# Patient Record
Sex: Male | Born: 1960 | Race: White | Hispanic: No | Marital: Married | Smoking: Current every day smoker
Health system: Southern US, Community
[De-identification: ages and names within clinical notes are randomized; demographics above are authoritative.]

## PROBLEM LIST (undated history)

## (undated) DIAGNOSIS — I10 Essential (primary) hypertension: Secondary | ICD-10-CM

## (undated) DIAGNOSIS — K219 Gastro-esophageal reflux disease without esophagitis: Secondary | ICD-10-CM

## (undated) DIAGNOSIS — K449 Diaphragmatic hernia without obstruction or gangrene: Secondary | ICD-10-CM

## (undated) DIAGNOSIS — M1990 Unspecified osteoarthritis, unspecified site: Secondary | ICD-10-CM

## (undated) DIAGNOSIS — Z87442 Personal history of urinary calculi: Secondary | ICD-10-CM

## (undated) HISTORY — PX: BACK SURGERY: SHX140

## (undated) HISTORY — PX: JOINT REPLACEMENT: SHX530

## (undated) HISTORY — PX: SHOULDER ARTHROSCOPY: SHX128

## (undated) HISTORY — PX: ANTERIOR CERVICAL DECOMP/DISCECTOMY FUSION: SHX1161

---

## 2003-12-12 ENCOUNTER — Ambulatory Visit: Payer: Self-pay | Admitting: Anesthesiology

## 2003-12-31 ENCOUNTER — Ambulatory Visit: Payer: Self-pay | Admitting: Anesthesiology

## 2004-01-06 ENCOUNTER — Encounter: Payer: Self-pay | Admitting: Anesthesiology

## 2004-01-07 ENCOUNTER — Encounter: Payer: Self-pay | Admitting: Anesthesiology

## 2004-01-29 ENCOUNTER — Ambulatory Visit: Payer: Self-pay | Admitting: Anesthesiology

## 2004-02-06 ENCOUNTER — Encounter: Payer: Self-pay | Admitting: Anesthesiology

## 2004-02-24 ENCOUNTER — Ambulatory Visit: Payer: Self-pay | Admitting: Anesthesiology

## 2004-03-26 ENCOUNTER — Ambulatory Visit: Payer: Self-pay | Admitting: Anesthesiology

## 2004-04-22 ENCOUNTER — Ambulatory Visit: Payer: Self-pay | Admitting: Anesthesiology

## 2004-05-21 ENCOUNTER — Ambulatory Visit: Payer: Self-pay | Admitting: Anesthesiology

## 2004-06-10 ENCOUNTER — Ambulatory Visit: Payer: Self-pay | Admitting: Anesthesiology

## 2004-07-06 ENCOUNTER — Ambulatory Visit: Payer: Self-pay | Admitting: Anesthesiology

## 2004-08-06 ENCOUNTER — Ambulatory Visit: Payer: Self-pay | Admitting: Anesthesiology

## 2004-08-06 ENCOUNTER — Observation Stay: Payer: Self-pay | Admitting: Anesthesiology

## 2004-10-29 ENCOUNTER — Ambulatory Visit: Payer: Self-pay | Admitting: Anesthesiology

## 2004-11-26 ENCOUNTER — Ambulatory Visit: Payer: Self-pay | Admitting: Anesthesiology

## 2004-12-29 ENCOUNTER — Ambulatory Visit: Payer: Self-pay | Admitting: Anesthesiology

## 2005-01-19 ENCOUNTER — Ambulatory Visit: Payer: Self-pay | Admitting: Anesthesiology

## 2005-02-25 ENCOUNTER — Ambulatory Visit: Payer: Self-pay | Admitting: Anesthesiology

## 2005-03-30 ENCOUNTER — Ambulatory Visit: Payer: Self-pay | Admitting: Anesthesiology

## 2005-04-22 ENCOUNTER — Ambulatory Visit: Payer: Self-pay | Admitting: Anesthesiology

## 2005-05-20 ENCOUNTER — Ambulatory Visit: Payer: Self-pay | Admitting: Anesthesiology

## 2005-06-09 ENCOUNTER — Ambulatory Visit: Payer: Self-pay | Admitting: Anesthesiology

## 2005-07-22 ENCOUNTER — Ambulatory Visit: Payer: Self-pay | Admitting: Anesthesiology

## 2005-08-12 ENCOUNTER — Ambulatory Visit: Payer: Self-pay | Admitting: Anesthesiology

## 2005-09-16 ENCOUNTER — Ambulatory Visit: Payer: Self-pay | Admitting: Anesthesiology

## 2005-11-15 ENCOUNTER — Ambulatory Visit: Payer: Self-pay | Admitting: Anesthesiology

## 2005-12-13 ENCOUNTER — Ambulatory Visit: Payer: Self-pay | Admitting: Anesthesiology

## 2006-01-11 ENCOUNTER — Ambulatory Visit: Payer: Self-pay | Admitting: Anesthesiology

## 2006-02-09 ENCOUNTER — Ambulatory Visit: Payer: Self-pay | Admitting: Anesthesiology

## 2006-03-31 ENCOUNTER — Ambulatory Visit: Payer: Self-pay | Admitting: Anesthesiology

## 2006-04-28 ENCOUNTER — Ambulatory Visit: Payer: Self-pay | Admitting: Anesthesiology

## 2006-06-09 ENCOUNTER — Ambulatory Visit: Payer: Self-pay | Admitting: Anesthesiology

## 2006-07-06 ENCOUNTER — Ambulatory Visit: Payer: Self-pay | Admitting: Anesthesiology

## 2006-08-04 ENCOUNTER — Ambulatory Visit: Payer: Self-pay | Admitting: Anesthesiology

## 2006-09-06 ENCOUNTER — Ambulatory Visit: Payer: Self-pay | Admitting: Anesthesiology

## 2006-09-26 ENCOUNTER — Ambulatory Visit: Payer: Self-pay | Admitting: Pain Medicine

## 2006-09-29 ENCOUNTER — Ambulatory Visit: Payer: Self-pay | Admitting: Anesthesiology

## 2006-10-31 ENCOUNTER — Ambulatory Visit: Payer: Self-pay | Admitting: Anesthesiology

## 2006-12-06 ENCOUNTER — Ambulatory Visit: Payer: Self-pay | Admitting: Anesthesiology

## 2007-01-05 ENCOUNTER — Ambulatory Visit: Payer: Self-pay | Admitting: Anesthesiology

## 2007-01-30 ENCOUNTER — Ambulatory Visit: Payer: Self-pay | Admitting: Anesthesiology

## 2007-02-27 ENCOUNTER — Ambulatory Visit: Payer: Self-pay | Admitting: Anesthesiology

## 2007-05-01 ENCOUNTER — Ambulatory Visit: Payer: Self-pay | Admitting: Anesthesiology

## 2007-06-27 ENCOUNTER — Ambulatory Visit: Payer: Self-pay | Admitting: Anesthesiology

## 2007-08-01 ENCOUNTER — Ambulatory Visit: Payer: Self-pay | Admitting: Anesthesiology

## 2007-10-03 ENCOUNTER — Ambulatory Visit: Payer: Self-pay | Admitting: Anesthesiology

## 2007-11-01 ENCOUNTER — Ambulatory Visit: Payer: Self-pay | Admitting: Anesthesiology

## 2007-11-15 ENCOUNTER — Ambulatory Visit: Payer: Self-pay | Admitting: Unknown Physician Specialty

## 2007-11-27 ENCOUNTER — Ambulatory Visit: Payer: Self-pay | Admitting: Anesthesiology

## 2007-12-14 ENCOUNTER — Other Ambulatory Visit: Payer: Self-pay

## 2007-12-14 ENCOUNTER — Ambulatory Visit: Payer: Self-pay | Admitting: Unknown Physician Specialty

## 2007-12-20 ENCOUNTER — Ambulatory Visit: Payer: Self-pay | Admitting: Unknown Physician Specialty

## 2007-12-27 ENCOUNTER — Ambulatory Visit: Payer: Self-pay | Admitting: Anesthesiology

## 2008-01-18 ENCOUNTER — Ambulatory Visit: Payer: Self-pay | Admitting: Anesthesiology

## 2008-02-27 ENCOUNTER — Ambulatory Visit: Payer: Self-pay | Admitting: Anesthesiology

## 2008-04-25 ENCOUNTER — Ambulatory Visit: Payer: Self-pay | Admitting: Anesthesiology

## 2008-06-24 ENCOUNTER — Ambulatory Visit: Payer: Self-pay | Admitting: Anesthesiology

## 2008-09-19 ENCOUNTER — Ambulatory Visit: Payer: Self-pay | Admitting: Anesthesiology

## 2008-11-19 ENCOUNTER — Ambulatory Visit: Payer: Self-pay | Admitting: Anesthesiology

## 2009-01-04 ENCOUNTER — Ambulatory Visit: Payer: Self-pay | Admitting: Unknown Physician Specialty

## 2009-02-11 ENCOUNTER — Ambulatory Visit: Payer: Self-pay | Admitting: Unknown Physician Specialty

## 2009-02-18 ENCOUNTER — Ambulatory Visit: Payer: Self-pay | Admitting: Unknown Physician Specialty

## 2010-10-23 ENCOUNTER — Ambulatory Visit: Payer: Self-pay | Admitting: Unknown Physician Specialty

## 2010-11-06 ENCOUNTER — Ambulatory Visit: Payer: Self-pay | Admitting: Unknown Physician Specialty

## 2012-04-12 ENCOUNTER — Emergency Department: Payer: Self-pay | Admitting: Emergency Medicine

## 2012-04-12 LAB — BASIC METABOLIC PANEL
Anion Gap: 7 (ref 7–16)
BUN: 14 mg/dL (ref 7–18)
Calcium, Total: 9.3 mg/dL (ref 8.5–10.1)
Chloride: 104 mmol/L (ref 98–107)
EGFR (African American): >60
EGFR (Non-African Amer.): >60
Glucose: 124 mg/dL — ABNORMAL HIGH (ref 65–99)
Potassium: 3.3 mmol/L — ABNORMAL LOW (ref 3.5–5.1)

## 2012-04-12 LAB — CBC
HGB: 16.6 g/dL (ref 13.0–18.0)
MCH: 30.3 pg (ref 26.0–34.0)
MCHC: 34.6 g/dL (ref 32.0–36.0)
Platelet: 273 10*3/uL (ref 150–440)

## 2012-04-12 LAB — TROPONIN I: Troponin-I: <0.02 ng/mL

## 2012-04-12 LAB — CK TOTAL AND CKMB (NOT AT ARMC): CK-MB: <0.5 ng/mL — ABNORMAL LOW (ref 0.5–3.6)

## 2014-05-22 DIAGNOSIS — G6289 Other specified polyneuropathies: Secondary | ICD-10-CM | POA: Diagnosis not present

## 2014-05-22 DIAGNOSIS — I10 Essential (primary) hypertension: Secondary | ICD-10-CM | POA: Diagnosis not present

## 2014-05-22 DIAGNOSIS — M545 Low back pain: Secondary | ICD-10-CM | POA: Diagnosis not present

## 2014-05-22 DIAGNOSIS — F1190 Opioid use, unspecified, uncomplicated: Secondary | ICD-10-CM | POA: Diagnosis not present

## 2014-06-30 ENCOUNTER — Emergency Department: Admit: 2014-06-30 | Disposition: A | Discharge status: Home / Self Care | Payer: Self-pay | Admitting: Student

## 2014-06-30 DIAGNOSIS — Z880 Allergy status to penicillin: Secondary | ICD-10-CM | POA: Diagnosis not present

## 2014-06-30 DIAGNOSIS — R569 Unspecified convulsions: Secondary | ICD-10-CM | POA: Diagnosis not present

## 2014-06-30 DIAGNOSIS — I10 Essential (primary) hypertension: Secondary | ICD-10-CM | POA: Diagnosis not present

## 2014-06-30 DIAGNOSIS — G40909 Epilepsy, unspecified, not intractable, without status epilepticus: Secondary | ICD-10-CM | POA: Diagnosis not present

## 2014-06-30 DIAGNOSIS — Z720 Tobacco use: Secondary | ICD-10-CM | POA: Diagnosis not present

## 2014-06-30 LAB — CBC
HCT: 44.3 % (ref 40.0–52.0)
HGB: 15.3 g/dL (ref 13.0–18.0)
MCH: 30.4 pg (ref 26.0–34.0)
MCHC: 34.4 g/dL (ref 32.0–36.0)
MCV: 88 fL (ref 80–100)
PLATELETS: 226 10*3/uL (ref 150–440)
RBC: 5.02 10*6/uL (ref 4.40–5.90)
RDW: 13.6 % (ref 11.5–14.5)
WBC: 14 10*3/uL — ABNORMAL HIGH (ref 3.8–10.6)

## 2014-06-30 LAB — URINALYSIS, COMPLETE
BACTERIA: NONE SEEN
BILIRUBIN, UR: NEGATIVE
GLUCOSE, UR: NEGATIVE mg/dL (ref 0–75)
Ketone: NEGATIVE
Leukocyte Esterase: NEGATIVE
Nitrite: NEGATIVE
Ph: 5 (ref 4.5–8.0)
Protein: 30
SPECIFIC GRAVITY: 1.015 (ref 1.003–1.030)
Squamous Epithelial: NONE SEEN

## 2014-06-30 LAB — BASIC METABOLIC PANEL
ANION GAP: 14 (ref 7–16)
BUN: 10 mg/dL
CHLORIDE: 105 mmol/L
CREATININE: 0.85 mg/dL
Calcium, Total: 8.9 mg/dL
Co2: 20 mmol/L — ABNORMAL LOW
EGFR (African American): >60
Glucose: 115 mg/dL — ABNORMAL HIGH
POTASSIUM: 3.3 mmol/L — AB
SODIUM: 139 mmol/L

## 2014-06-30 LAB — DRUG SCREEN, URINE
Amphetamines, Ur Screen: NEGATIVE
BARBITURATES, UR SCREEN: NEGATIVE
Benzodiazepine, Ur Scrn: POSITIVE
CANNABINOID 50 NG, UR ~~LOC~~: POSITIVE
COCAINE METABOLITE, UR ~~LOC~~: NEGATIVE
MDMA (ECSTASY) UR SCREEN: NEGATIVE
Methadone, Ur Screen: NEGATIVE
OPIATE, UR SCREEN: POSITIVE
PHENCYCLIDINE (PCP) UR S: NEGATIVE
Tricyclic, Ur Screen: POSITIVE

## 2014-06-30 LAB — TROPONIN I: Troponin-I: <0.03 ng/mL

## 2014-06-30 LAB — ETHANOL

## 2015-12-10 DIAGNOSIS — Z720 Tobacco use: Secondary | ICD-10-CM | POA: Diagnosis present

## 2017-07-26 DIAGNOSIS — I10 Essential (primary) hypertension: Secondary | ICD-10-CM | POA: Diagnosis not present

## 2017-07-26 DIAGNOSIS — E7800 Pure hypercholesterolemia, unspecified: Secondary | ICD-10-CM | POA: Diagnosis not present

## 2017-07-26 DIAGNOSIS — Z720 Tobacco use: Secondary | ICD-10-CM | POA: Diagnosis not present

## 2017-07-26 DIAGNOSIS — Z125 Encounter for screening for malignant neoplasm of prostate: Secondary | ICD-10-CM | POA: Diagnosis not present

## 2017-10-26 DIAGNOSIS — G6289 Other specified polyneuropathies: Secondary | ICD-10-CM | POA: Diagnosis not present

## 2017-10-26 DIAGNOSIS — M545 Low back pain: Secondary | ICD-10-CM | POA: Diagnosis not present

## 2017-10-26 DIAGNOSIS — Z0000 Encounter for general adult medical examination without abnormal findings: Secondary | ICD-10-CM | POA: Diagnosis not present

## 2017-10-26 DIAGNOSIS — I10 Essential (primary) hypertension: Secondary | ICD-10-CM | POA: Diagnosis not present

## 2017-10-26 DIAGNOSIS — E7800 Pure hypercholesterolemia, unspecified: Secondary | ICD-10-CM | POA: Diagnosis not present

## 2018-01-04 DIAGNOSIS — I10 Essential (primary) hypertension: Secondary | ICD-10-CM | POA: Diagnosis not present

## 2018-01-04 DIAGNOSIS — E7800 Pure hypercholesterolemia, unspecified: Secondary | ICD-10-CM | POA: Diagnosis not present

## 2018-01-11 DIAGNOSIS — M545 Low back pain: Secondary | ICD-10-CM | POA: Diagnosis not present

## 2018-01-11 DIAGNOSIS — E7800 Pure hypercholesterolemia, unspecified: Secondary | ICD-10-CM | POA: Diagnosis not present

## 2018-01-11 DIAGNOSIS — G6289 Other specified polyneuropathies: Secondary | ICD-10-CM | POA: Diagnosis not present

## 2018-01-11 DIAGNOSIS — Z0001 Encounter for general adult medical examination with abnormal findings: Secondary | ICD-10-CM | POA: Diagnosis not present

## 2018-01-11 DIAGNOSIS — I10 Essential (primary) hypertension: Secondary | ICD-10-CM | POA: Diagnosis not present

## 2018-04-13 ENCOUNTER — Other Ambulatory Visit: Payer: Self-pay

## 2018-04-13 ENCOUNTER — Emergency Department: Payer: Medicare Other

## 2018-04-13 ENCOUNTER — Emergency Department
Admission: EM | Admit: 2018-04-13 | Discharge: 2018-04-13 | Discharge status: Against Medical Advice | Payer: Medicare Other | Attending: Emergency Medicine | Admitting: Emergency Medicine

## 2018-04-13 ENCOUNTER — Encounter: Payer: Self-pay | Admitting: Emergency Medicine

## 2018-04-13 DIAGNOSIS — R410 Disorientation, unspecified: Secondary | ICD-10-CM

## 2018-04-13 DIAGNOSIS — Z5321 Procedure and treatment not carried out due to patient leaving prior to being seen by health care provider: Secondary | ICD-10-CM | POA: Insufficient documentation

## 2018-04-13 DIAGNOSIS — R4182 Altered mental status, unspecified: Secondary | ICD-10-CM | POA: Diagnosis not present

## 2018-04-13 HISTORY — DX: Unspecified osteoarthritis, unspecified site: M19.90

## 2018-04-13 HISTORY — DX: Diaphragmatic hernia without obstruction or gangrene: K44.9

## 2018-04-13 HISTORY — DX: Essential (primary) hypertension: I10

## 2018-04-13 LAB — COMPREHENSIVE METABOLIC PANEL
ALBUMIN: 4 g/dL (ref 3.5–5.0)
ALT: 21 U/L (ref 0–44)
ANION GAP: 6 (ref 5–15)
AST: 21 U/L (ref 15–41)
Alkaline Phosphatase: 49 U/L (ref 38–126)
BUN: 8 mg/dL (ref 6–20)
CO2: 30 mmol/L (ref 22–32)
Calcium: 8.3 mg/dL — ABNORMAL LOW (ref 8.9–10.3)
Chloride: 105 mmol/L (ref 98–111)
Creatinine, Ser: 0.86 mg/dL (ref 0.61–1.24)
GFR calc Af Amer: >60 mL/min (ref >60)
GFR calc non Af Amer: >60 mL/min (ref >60)
GLUCOSE: 134 mg/dL — AB (ref 70–99)
POTASSIUM: 3.7 mmol/L (ref 3.5–5.1)
Sodium: 141 mmol/L (ref 135–145)
Total Bilirubin: 0.5 mg/dL (ref 0.3–1.2)
Total Protein: 6.4 g/dL — ABNORMAL LOW (ref 6.5–8.1)

## 2018-04-13 LAB — CBC
HCT: 42.5 % (ref 39.0–52.0)
HEMOGLOBIN: 14.1 g/dL (ref 13.0–17.0)
MCH: 29.6 pg (ref 26.0–34.0)
MCHC: 33.2 g/dL (ref 30.0–36.0)
MCV: 89.3 fL (ref 80.0–100.0)
Platelets: 217 10*3/uL (ref 150–400)
RBC: 4.76 MIL/uL (ref 4.22–5.81)
RDW: 13 % (ref 11.5–15.5)
WBC: 10.1 10*3/uL (ref 4.0–10.5)
nRBC: 0 % (ref 0.0–0.2)

## 2018-04-13 LAB — TROPONIN I: Troponin I: <0.03 ng/mL (ref <0.03)

## 2018-04-13 LAB — TSH: TSH: 2.492 u[IU]/mL (ref 0.350–4.500)

## 2018-04-13 NOTE — ED Notes (Signed)
Patient left without being seen, Md made aware.

## 2018-04-13 NOTE — ED Provider Notes (Signed)
The patient left prior to my medical examination.   Rockne Menghini, MD 04/13/18 2220

## 2018-04-13 NOTE — ED Notes (Signed)
Assumed care of patient reports having memory loss. No facial drooping noted. Patient sitting up in bed having conversation with brother. Speech is cleer.

## 2018-04-13 NOTE — ED Notes (Signed)
Patient awaiting plan of care. Sitting up in bed talking to his brother. Appears comfortable at present time. Will continue to monitor.

## 2018-04-13 NOTE — ED Triage Notes (Signed)
Pt has been confused since he fell and lost consciousness in October. Today the son states his left jaw was drooping and more confused. Pt was able to give all info. Pt has seen pcp and has another appt on Monday. Son unsure what his dr is diagnosing him with.

## 2018-04-17 DIAGNOSIS — Z720 Tobacco use: Secondary | ICD-10-CM | POA: Diagnosis not present

## 2018-04-17 DIAGNOSIS — Z0000 Encounter for general adult medical examination without abnormal findings: Secondary | ICD-10-CM | POA: Diagnosis not present

## 2018-04-17 DIAGNOSIS — E7800 Pure hypercholesterolemia, unspecified: Secondary | ICD-10-CM | POA: Diagnosis not present

## 2018-04-17 DIAGNOSIS — I10 Essential (primary) hypertension: Secondary | ICD-10-CM | POA: Diagnosis not present

## 2018-07-17 DIAGNOSIS — I10 Essential (primary) hypertension: Secondary | ICD-10-CM | POA: Diagnosis not present

## 2018-07-17 DIAGNOSIS — Z720 Tobacco use: Secondary | ICD-10-CM | POA: Diagnosis not present

## 2018-07-17 DIAGNOSIS — G6289 Other specified polyneuropathies: Secondary | ICD-10-CM | POA: Diagnosis not present

## 2018-07-17 DIAGNOSIS — E7800 Pure hypercholesterolemia, unspecified: Secondary | ICD-10-CM | POA: Diagnosis not present

## 2018-07-17 DIAGNOSIS — M545 Low back pain: Secondary | ICD-10-CM | POA: Diagnosis not present

## 2018-10-17 DIAGNOSIS — I10 Essential (primary) hypertension: Secondary | ICD-10-CM | POA: Diagnosis not present

## 2018-10-17 DIAGNOSIS — E7800 Pure hypercholesterolemia, unspecified: Secondary | ICD-10-CM | POA: Diagnosis not present

## 2018-10-17 DIAGNOSIS — M545 Low back pain: Secondary | ICD-10-CM | POA: Diagnosis not present

## 2018-10-17 DIAGNOSIS — Z720 Tobacco use: Secondary | ICD-10-CM | POA: Diagnosis not present

## 2018-10-17 DIAGNOSIS — G6289 Other specified polyneuropathies: Secondary | ICD-10-CM | POA: Diagnosis not present

## 2018-10-31 DIAGNOSIS — R3129 Other microscopic hematuria: Secondary | ICD-10-CM | POA: Diagnosis not present

## 2018-11-15 DIAGNOSIS — R310 Gross hematuria: Secondary | ICD-10-CM | POA: Diagnosis not present

## 2018-11-16 ENCOUNTER — Other Ambulatory Visit: Payer: Self-pay | Admitting: Urology

## 2018-11-16 DIAGNOSIS — R310 Gross hematuria: Secondary | ICD-10-CM

## 2018-11-27 ENCOUNTER — Ambulatory Visit
Admission: RE | Admit: 2018-11-27 | Discharge: 2018-11-27 | Disposition: A | Discharge status: Home / Self Care | Payer: Medicare Other | Source: Ambulatory Visit | Attending: Urology | Admitting: Urology

## 2018-11-27 ENCOUNTER — Other Ambulatory Visit: Payer: Self-pay

## 2018-11-27 DIAGNOSIS — N200 Calculus of kidney: Secondary | ICD-10-CM | POA: Diagnosis not present

## 2018-11-27 DIAGNOSIS — R310 Gross hematuria: Secondary | ICD-10-CM | POA: Insufficient documentation

## 2018-11-27 MED ORDER — IOHEXOL 300 MG/ML  SOLN
125.0000 mL | Freq: Once | INTRAMUSCULAR | Status: AC | PRN
  Administered 2018-11-27: 125 mL via INTRAVENOUS

## 2018-12-19 DIAGNOSIS — N200 Calculus of kidney: Secondary | ICD-10-CM | POA: Diagnosis not present

## 2018-12-19 DIAGNOSIS — R310 Gross hematuria: Secondary | ICD-10-CM | POA: Diagnosis not present

## 2018-12-20 DIAGNOSIS — R310 Gross hematuria: Secondary | ICD-10-CM | POA: Diagnosis not present

## 2018-12-21 DIAGNOSIS — R310 Gross hematuria: Secondary | ICD-10-CM | POA: Diagnosis not present

## 2019-03-08 DIAGNOSIS — G8929 Other chronic pain: Secondary | ICD-10-CM | POA: Diagnosis not present

## 2019-03-08 DIAGNOSIS — M545 Low back pain: Secondary | ICD-10-CM | POA: Diagnosis not present

## 2019-03-08 DIAGNOSIS — E785 Hyperlipidemia, unspecified: Secondary | ICD-10-CM | POA: Diagnosis not present

## 2019-03-08 DIAGNOSIS — Z0001 Encounter for general adult medical examination with abnormal findings: Secondary | ICD-10-CM | POA: Diagnosis not present

## 2019-03-08 DIAGNOSIS — L03112 Cellulitis of left axilla: Secondary | ICD-10-CM | POA: Diagnosis not present

## 2019-07-05 DIAGNOSIS — Z0000 Encounter for general adult medical examination without abnormal findings: Secondary | ICD-10-CM | POA: Diagnosis not present

## 2019-07-05 DIAGNOSIS — F17210 Nicotine dependence, cigarettes, uncomplicated: Secondary | ICD-10-CM | POA: Diagnosis not present

## 2019-07-05 DIAGNOSIS — Z1389 Encounter for screening for other disorder: Secondary | ICD-10-CM | POA: Diagnosis not present

## 2019-07-05 DIAGNOSIS — I10 Essential (primary) hypertension: Secondary | ICD-10-CM | POA: Diagnosis not present

## 2019-07-05 DIAGNOSIS — E7800 Pure hypercholesterolemia, unspecified: Secondary | ICD-10-CM | POA: Diagnosis not present

## 2019-10-09 DIAGNOSIS — G8929 Other chronic pain: Secondary | ICD-10-CM | POA: Diagnosis not present

## 2019-10-09 DIAGNOSIS — M545 Low back pain: Secondary | ICD-10-CM | POA: Diagnosis not present

## 2019-10-09 DIAGNOSIS — I10 Essential (primary) hypertension: Secondary | ICD-10-CM | POA: Diagnosis not present

## 2020-01-02 DIAGNOSIS — I10 Essential (primary) hypertension: Secondary | ICD-10-CM | POA: Diagnosis not present

## 2020-01-15 DIAGNOSIS — Z23 Encounter for immunization: Secondary | ICD-10-CM | POA: Diagnosis not present

## 2020-01-15 DIAGNOSIS — E7800 Pure hypercholesterolemia, unspecified: Secondary | ICD-10-CM | POA: Diagnosis not present

## 2020-01-15 DIAGNOSIS — R739 Hyperglycemia, unspecified: Secondary | ICD-10-CM | POA: Diagnosis not present

## 2020-01-15 DIAGNOSIS — F17210 Nicotine dependence, cigarettes, uncomplicated: Secondary | ICD-10-CM | POA: Diagnosis not present

## 2020-01-15 DIAGNOSIS — I10 Essential (primary) hypertension: Secondary | ICD-10-CM | POA: Diagnosis not present

## 2020-01-15 DIAGNOSIS — M545 Low back pain, unspecified: Secondary | ICD-10-CM | POA: Diagnosis not present

## 2020-05-06 DIAGNOSIS — E7800 Pure hypercholesterolemia, unspecified: Secondary | ICD-10-CM | POA: Diagnosis not present

## 2020-05-06 DIAGNOSIS — R739 Hyperglycemia, unspecified: Secondary | ICD-10-CM | POA: Diagnosis not present

## 2020-06-13 DIAGNOSIS — Z1389 Encounter for screening for other disorder: Secondary | ICD-10-CM | POA: Diagnosis not present

## 2020-06-13 DIAGNOSIS — Z0001 Encounter for general adult medical examination with abnormal findings: Secondary | ICD-10-CM | POA: Diagnosis not present

## 2020-06-13 DIAGNOSIS — Z720 Tobacco use: Secondary | ICD-10-CM | POA: Diagnosis not present

## 2020-06-13 DIAGNOSIS — E7800 Pure hypercholesterolemia, unspecified: Secondary | ICD-10-CM | POA: Diagnosis not present

## 2020-06-13 DIAGNOSIS — Z0000 Encounter for general adult medical examination without abnormal findings: Secondary | ICD-10-CM | POA: Diagnosis not present

## 2020-06-13 DIAGNOSIS — G8929 Other chronic pain: Secondary | ICD-10-CM | POA: Diagnosis not present

## 2020-06-13 DIAGNOSIS — I10 Essential (primary) hypertension: Secondary | ICD-10-CM | POA: Diagnosis not present

## 2020-06-13 DIAGNOSIS — F17210 Nicotine dependence, cigarettes, uncomplicated: Secondary | ICD-10-CM | POA: Diagnosis not present

## 2020-06-13 DIAGNOSIS — M545 Low back pain, unspecified: Secondary | ICD-10-CM | POA: Diagnosis not present

## 2020-06-13 DIAGNOSIS — Z1211 Encounter for screening for malignant neoplasm of colon: Secondary | ICD-10-CM | POA: Diagnosis not present

## 2020-06-13 DIAGNOSIS — R319 Hematuria, unspecified: Secondary | ICD-10-CM | POA: Diagnosis not present

## 2020-07-21 IMAGING — CT CT HEAD W/O CM
3 series · 15 of 47 positions shown, 18 images · non-contrast
Comparison: 06/30/2014

CLINICAL DATA: Confusion.

EXAM:
CT HEAD WITHOUT CONTRAST
TECHNIQUE: Contiguous axial images were obtained from the base of the skull
through the vertex without intravenous contrast.

[Series 2: head wo · axial · 0.47mm/px · z∈[-155,-30]mm · 9 of 30 slices shown, 12 images]
[im 3/30  brain]
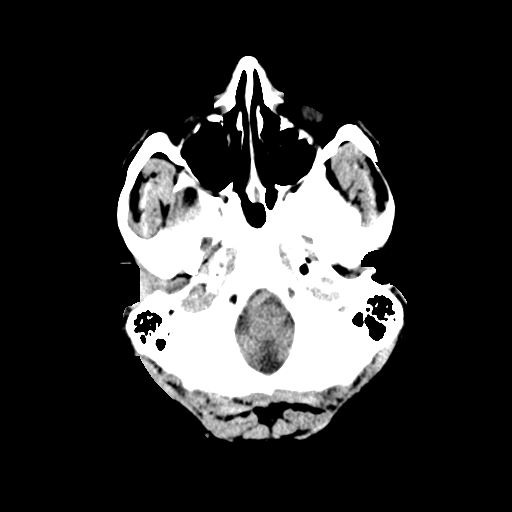
[im 3/30  bone]
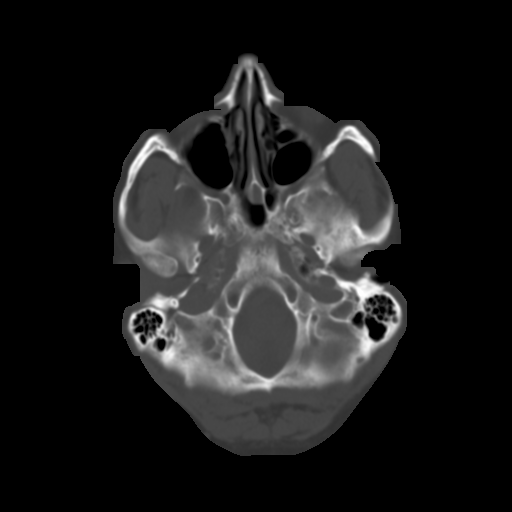
[im 6/30  brain]
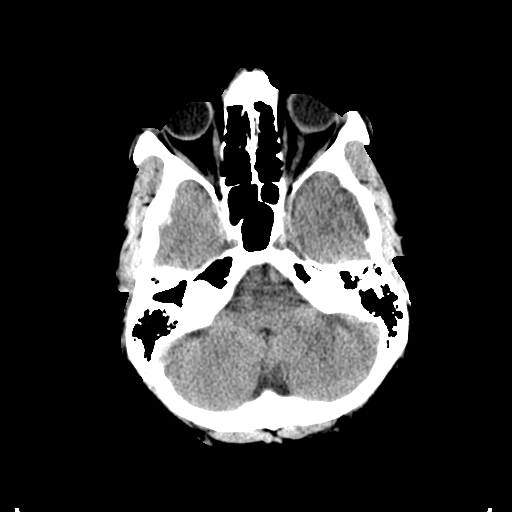
[im 9/30  brain]
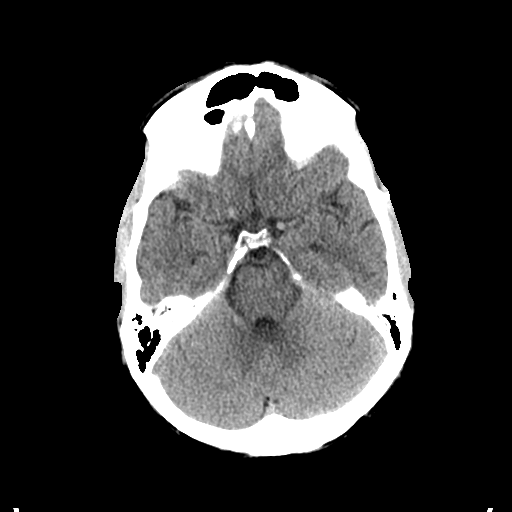
[im 12/30  brain]
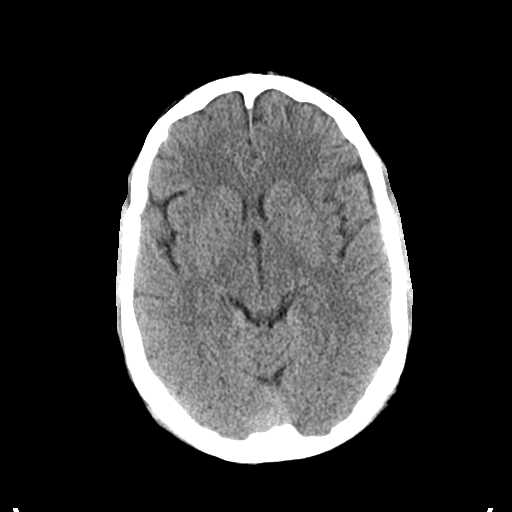
[im 16/30  brain]
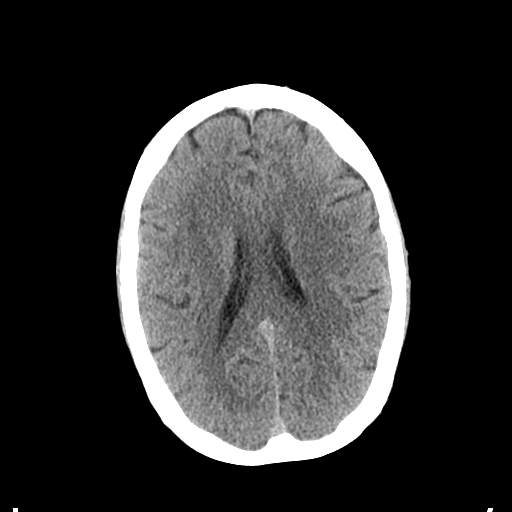
[im 16/30  bone]
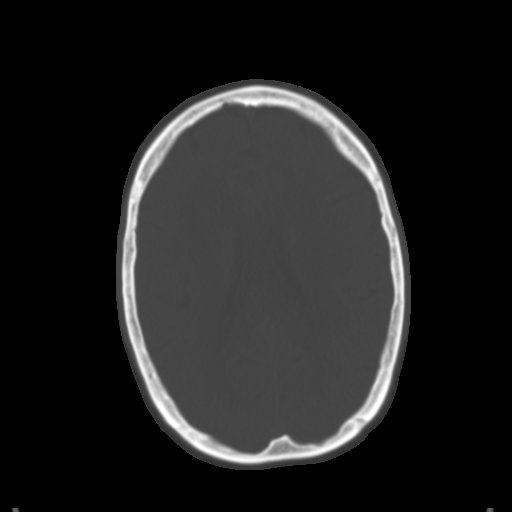
[im 19/30  brain]
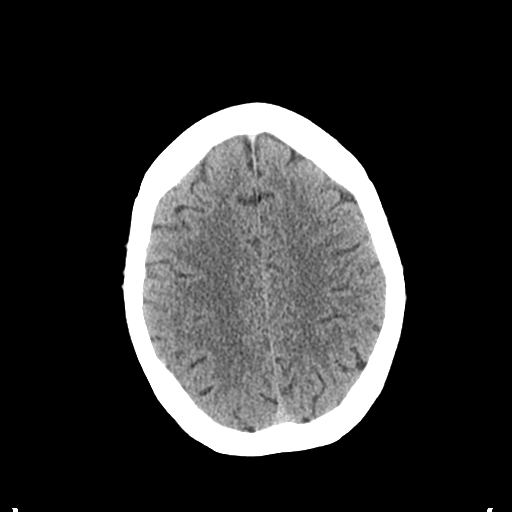
[im 22/30  brain]
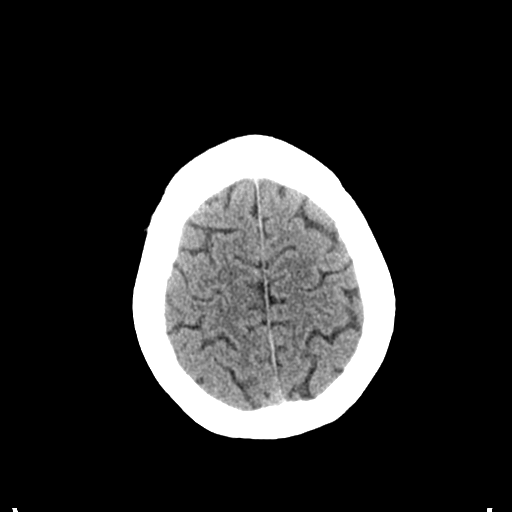
[im 25/30  brain]
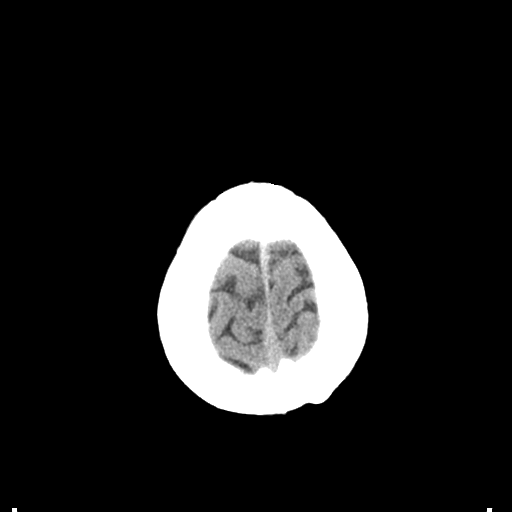
[im 28/30  brain]
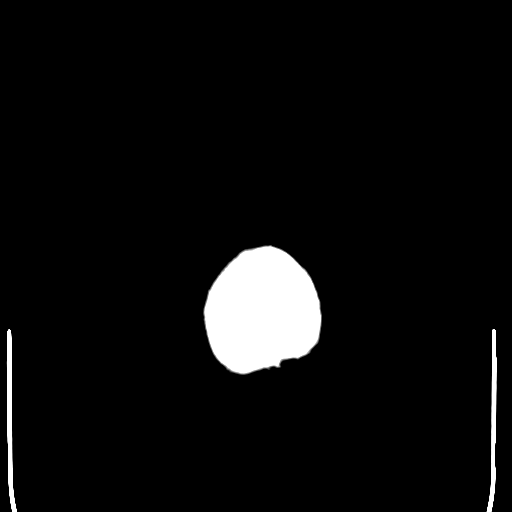
[im 28/30  bone]
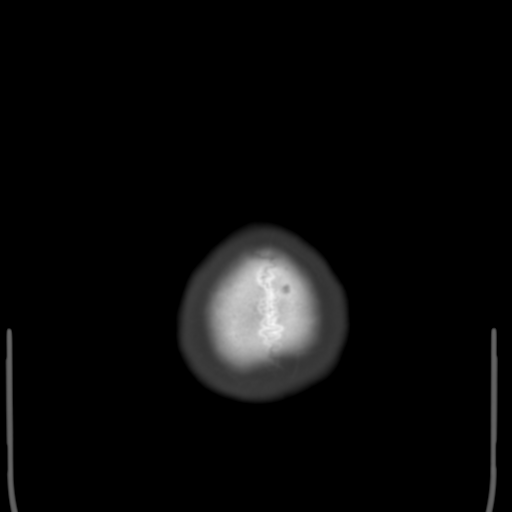

[Series 4: coronal soft tissue · coronal · 0.30mm/px · 3 of 62 slices shown]
[im 21/62  brain]
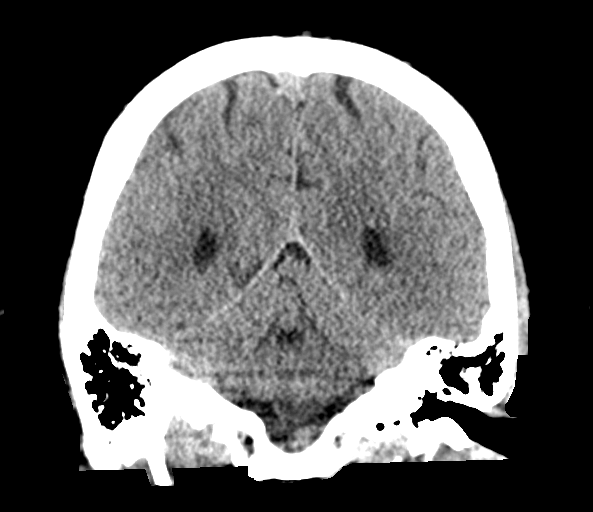
[im 28/62  brain]
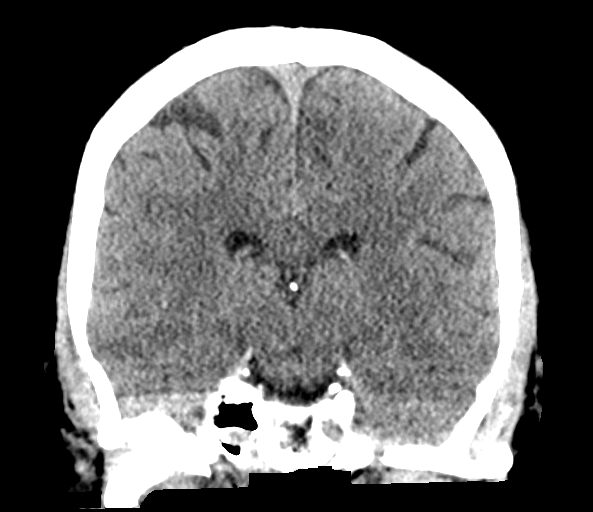
[im 34/62  brain]
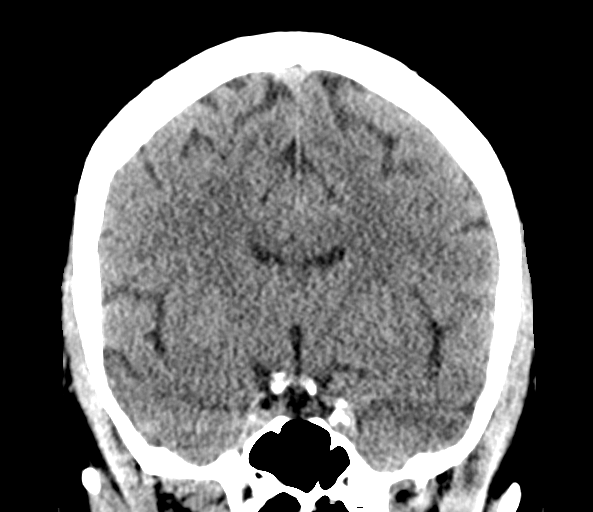

[Series 5: sagittal soft tissue · sagittal · 0.30mm/px · 3 of 52 slices shown]
[im 18/52  brain]
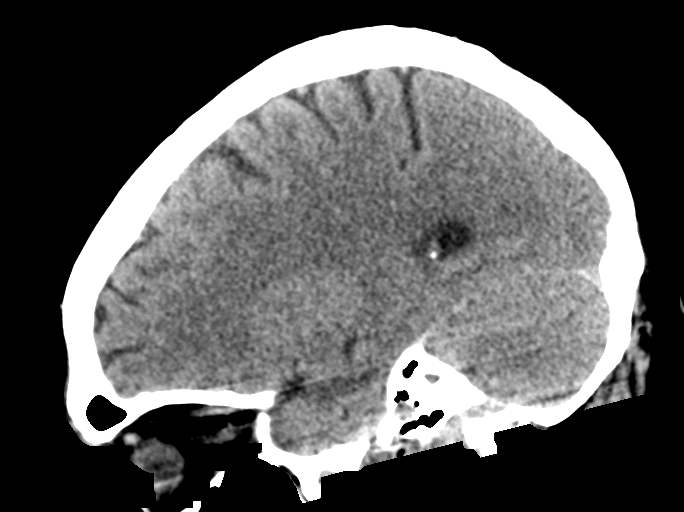
[im 26/52  brain]
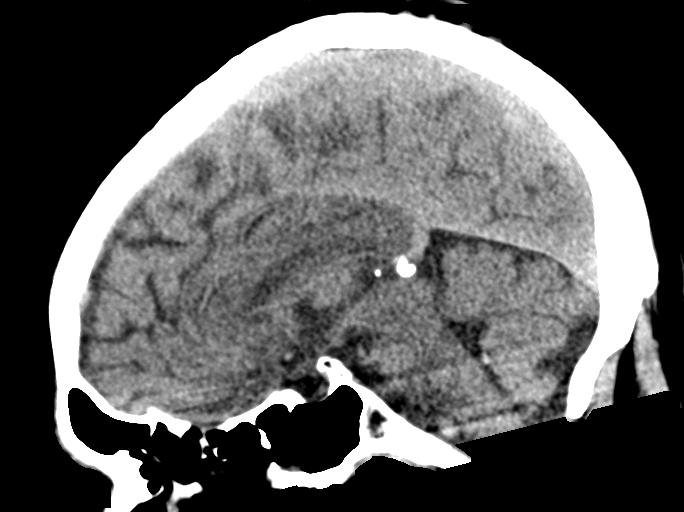
[im 35/52  brain]
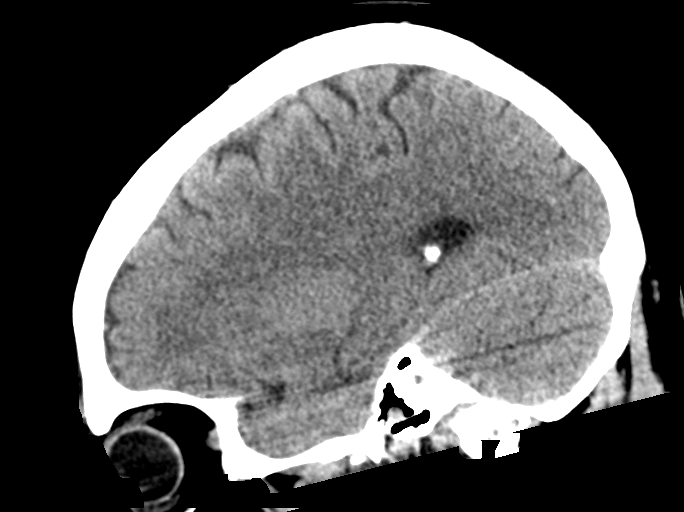

[15 of 47 positions shown; findings below may reference images not displayed]

FINDINGS: Brain: There is no evidence for acute hemorrhage, hydrocephalus,
mass lesion, or abnormal extra-axial fluid collection. No definite
CT evidence for acute infarction.

Vascular: No hyperdense vessel or unexpected calcification.

Skull: No evidence for fracture. No worrisome lytic or sclerotic
lesion.

Sinuses/Orbits: The visualized paranasal sinuses and mastoid air
cells are clear. Visualized portions of the globes and intraorbital
fat are unremarkable.

Other: None.
IMPRESSION: Stable.  No acute intracranial abnormality.

## 2020-09-12 DIAGNOSIS — I10 Essential (primary) hypertension: Secondary | ICD-10-CM | POA: Diagnosis not present

## 2020-09-12 DIAGNOSIS — F17210 Nicotine dependence, cigarettes, uncomplicated: Secondary | ICD-10-CM | POA: Diagnosis not present

## 2020-09-12 DIAGNOSIS — R739 Hyperglycemia, unspecified: Secondary | ICD-10-CM | POA: Diagnosis not present

## 2020-09-12 DIAGNOSIS — Z720 Tobacco use: Secondary | ICD-10-CM | POA: Diagnosis not present

## 2020-09-12 DIAGNOSIS — G8929 Other chronic pain: Secondary | ICD-10-CM | POA: Diagnosis not present

## 2020-09-12 DIAGNOSIS — Z1389 Encounter for screening for other disorder: Secondary | ICD-10-CM | POA: Diagnosis not present

## 2020-09-12 DIAGNOSIS — M545 Low back pain, unspecified: Secondary | ICD-10-CM | POA: Diagnosis not present

## 2020-09-12 DIAGNOSIS — E7800 Pure hypercholesterolemia, unspecified: Secondary | ICD-10-CM | POA: Diagnosis not present

## 2020-09-12 DIAGNOSIS — Z0000 Encounter for general adult medical examination without abnormal findings: Secondary | ICD-10-CM | POA: Diagnosis not present

## 2021-03-06 IMAGING — CT CT ABD-PEL WO/W CM
3 of 12 series · 12 of 46 positions shown, 18 images · IV contrast (omnipaque)
Comparison: None.

CLINICAL DATA: Gross hematuria, dysuria, history of kidney stones

EXAM:
CT ABDOMEN AND PELVIS WITHOUT AND WITH CONTRAST
TECHNIQUE: Multidetector CT imaging of the abdomen and pelvis was performed
following the standard protocol before and following the bolus
administration of intravenous contrast.
CONTRAST:  125mL OMNIPAQUE IOHEXOL 300 MG/ML  SOLN

[Series 5: cor without without pre · coronal · non-contrast · 0.69mm/px · 2 of 144 slices shown, 3 images]
[im 48/144  soft-tissue]
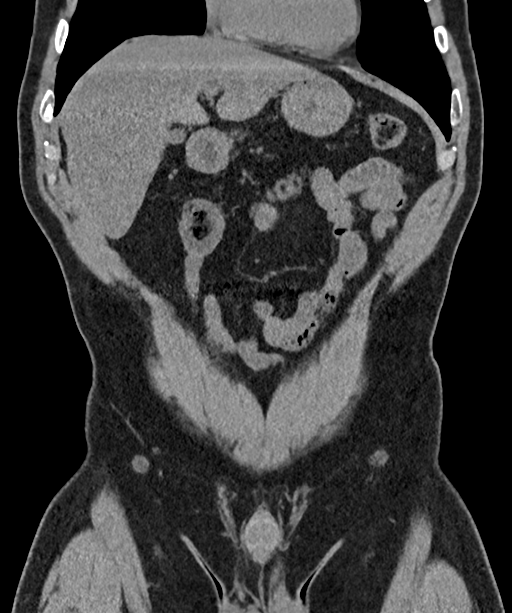
[im 48/144  bone]
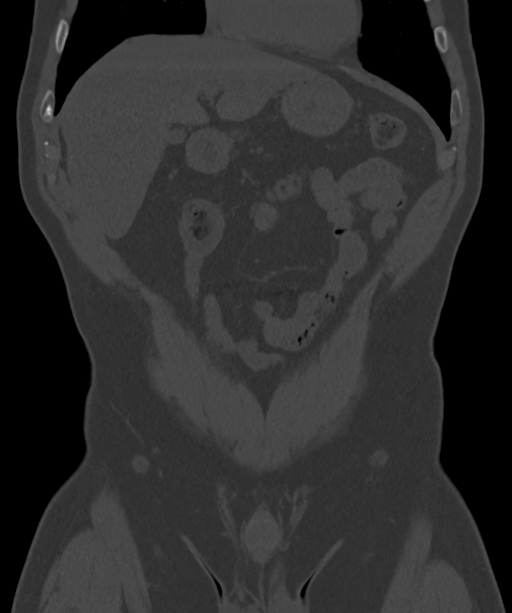
[im 96/144  soft-tissue]
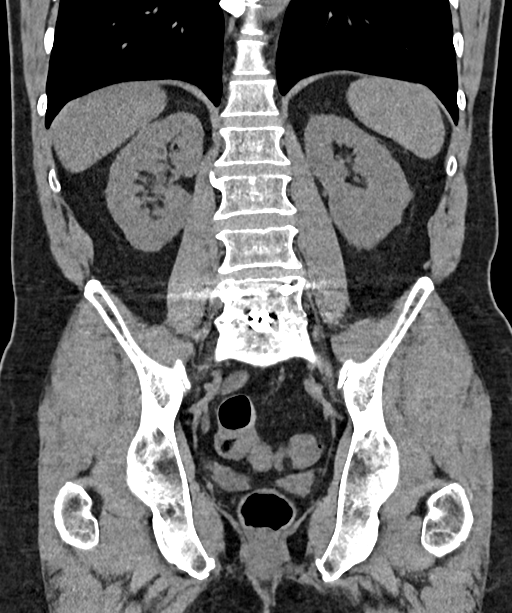

[Series 9: axial with hematuria with · axial · 0.69mm/px · z∈[-1457,-1277]mm · 4 of 85 slices shown]
[im 13/85  soft-tissue]
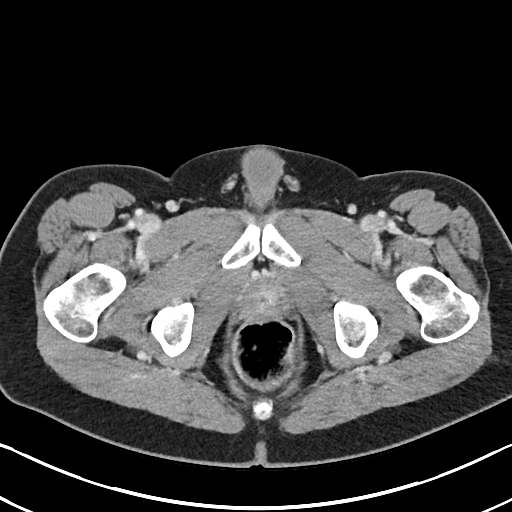
[im 25/85  soft-tissue]
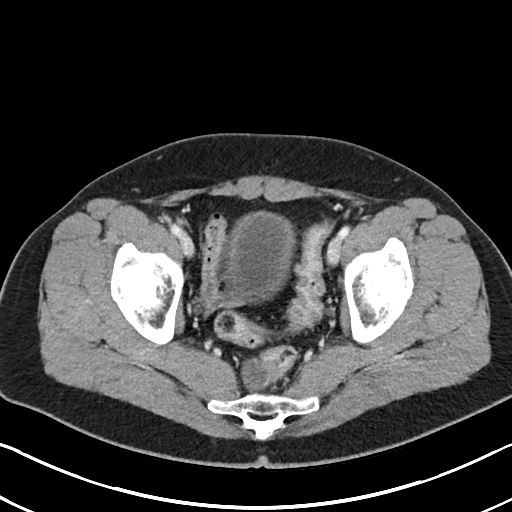
[im 37/85  soft-tissue]
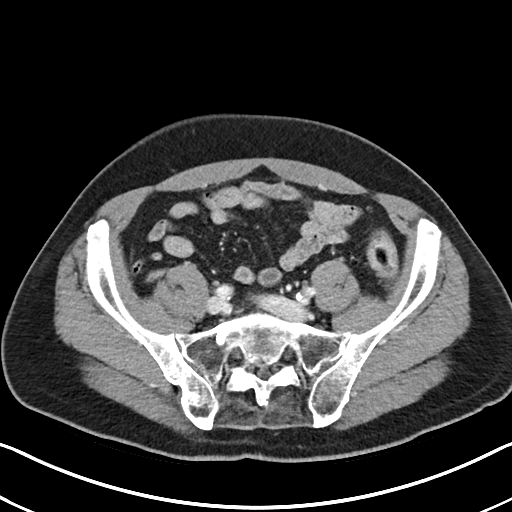
[im 49/85  soft-tissue]
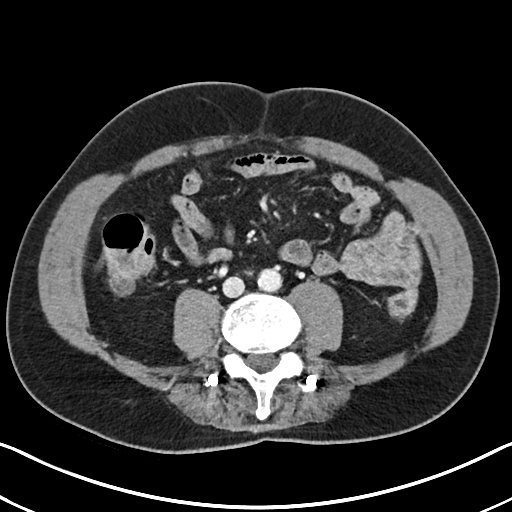

[Series 17: axial delay delay prone · axial · delayed · 0.69mm/px · z∈[-1439,-1119]mm · 6 of 90 slices shown, 11 images]
[im 13/90  soft-tissue]
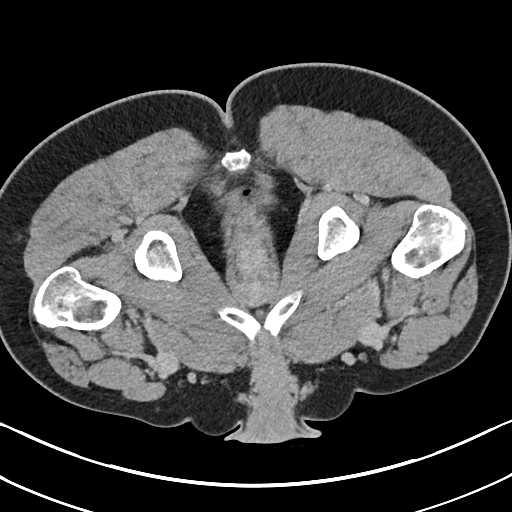
[im 13/90  bone]
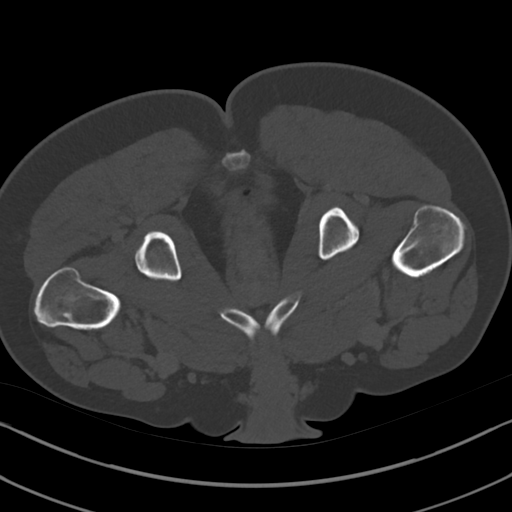
[im 26/90  soft-tissue]
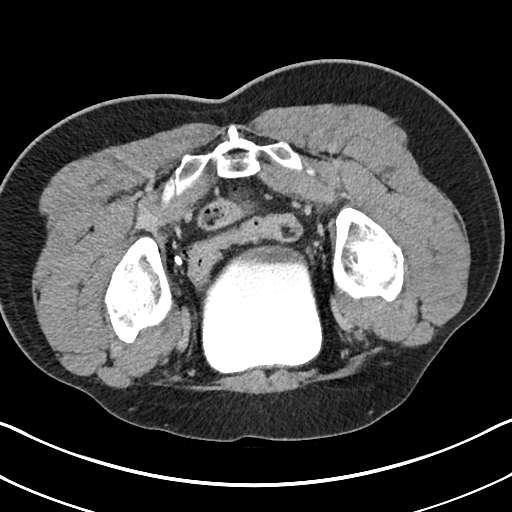
[im 39/90  soft-tissue]
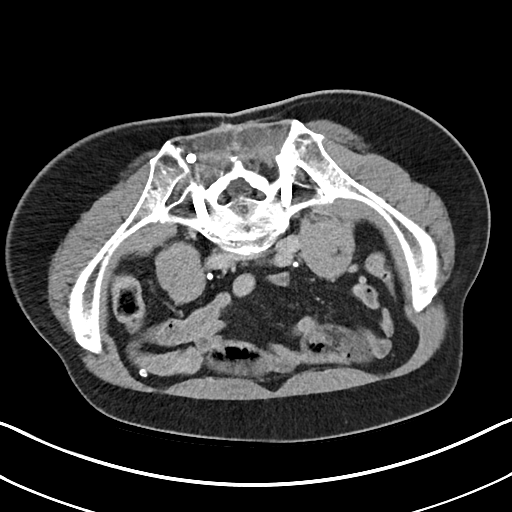
[im 39/90  lung]
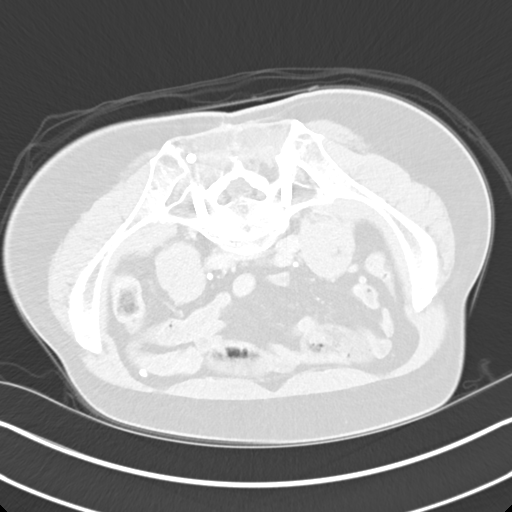
[im 51/90  soft-tissue]
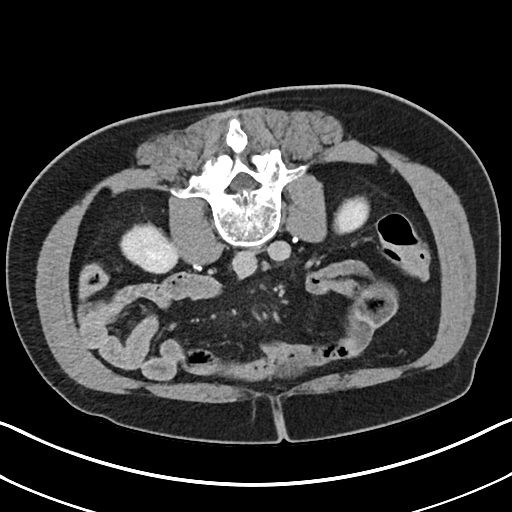
[im 51/90  lung]
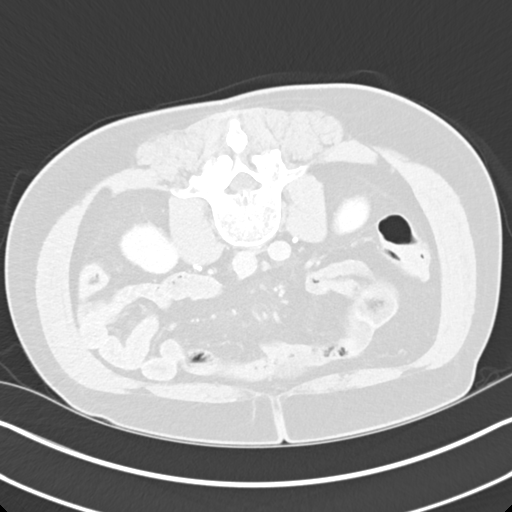
[im 64/90  soft-tissue]
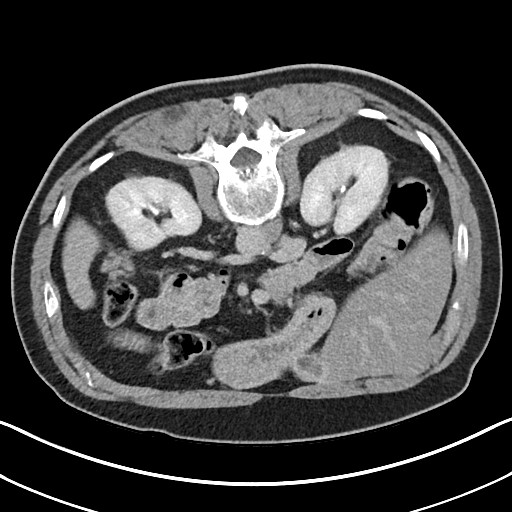
[im 64/90  lung]
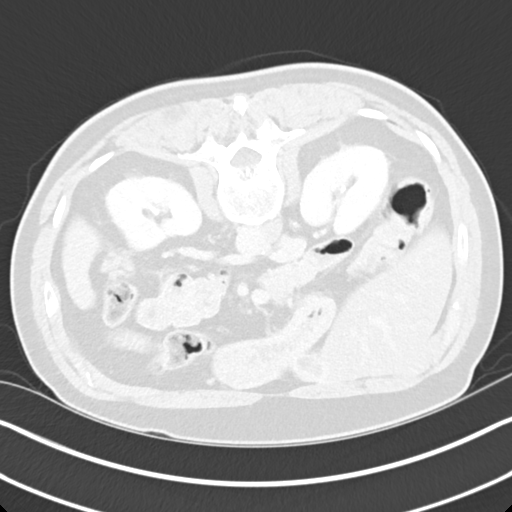
[im 77/90  soft-tissue]
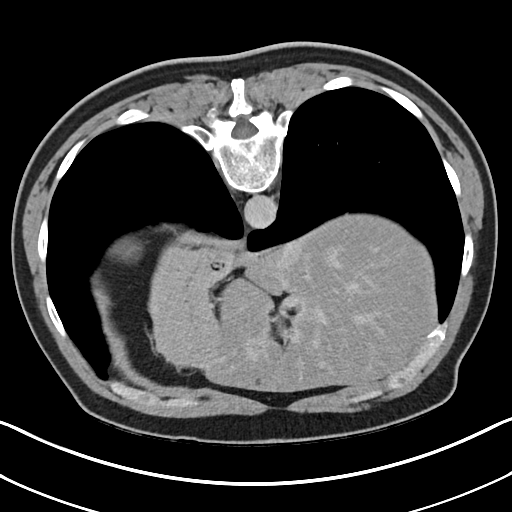
[im 77/90  lung]
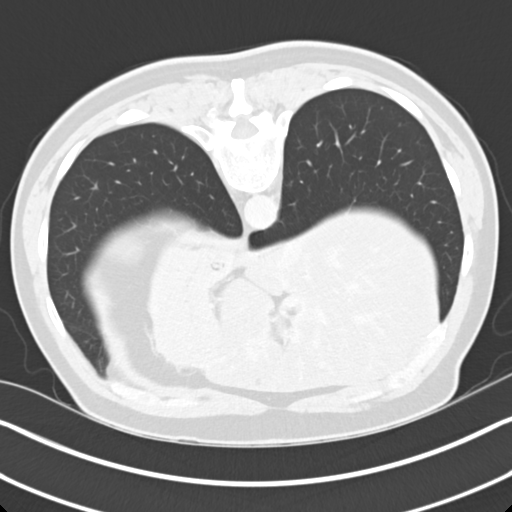

[12 of 46 positions shown; findings below may reference images not displayed]

FINDINGS: Lower chest: No acute abnormality.

Hepatobiliary: No solid liver abnormality is seen. Hepatic
steatosis. No gallstones, gallbladder wall thickening, or biliary
dilatation.

Pancreas: Unremarkable. No pancreatic ductal dilatation or
surrounding inflammatory changes.

Spleen: Normal in size without significant abnormality.

Adrenals/Urinary Tract: Adrenal glands are unremarkable. 5 mm
nonobstructive calculus of the inferior pole of the left kidney.
Thickening of the urinary bladder.

Stomach/Bowel: Stomach is within normal limits. Appendix appears
normal. No evidence of bowel wall thickening, distention, or
inflammatory changes. Sigmoid diverticulosis.

Vascular/Lymphatic: Aortic atherosclerosis. No enlarged abdominal or
pelvic lymph nodes.

Reproductive: Prostatomegaly with median lobe hypertrophy.

Other: No abdominal wall hernia or abnormality. No abdominopelvic
ascites.

Musculoskeletal: No acute or significant osseous findings.
Discectomy and fusion of L5-S1.
IMPRESSION: 1. There is a 5 mm nonobstructive calculus of the inferior pole of
the left kidney. No other evidence of urinary tract calculus or
hydronephrosis.

2. Prostatomegaly with median lobe hypertrophy. Thickening of the
urinary bladder, likely due to chronic outlet obstruction.

3.  Hepatic steatosis.

4.  Aortic Atherosclerosis ([id number]).

## 2021-03-17 DIAGNOSIS — M545 Low back pain, unspecified: Secondary | ICD-10-CM | POA: Diagnosis not present

## 2021-03-17 DIAGNOSIS — F17210 Nicotine dependence, cigarettes, uncomplicated: Secondary | ICD-10-CM | POA: Diagnosis not present

## 2021-03-17 DIAGNOSIS — E7800 Pure hypercholesterolemia, unspecified: Secondary | ICD-10-CM | POA: Diagnosis not present

## 2021-03-17 DIAGNOSIS — I10 Essential (primary) hypertension: Secondary | ICD-10-CM | POA: Diagnosis not present

## 2021-03-17 DIAGNOSIS — G8929 Other chronic pain: Secondary | ICD-10-CM | POA: Diagnosis not present

## 2021-03-17 DIAGNOSIS — Z125 Encounter for screening for malignant neoplasm of prostate: Secondary | ICD-10-CM | POA: Diagnosis not present

## 2021-06-25 DIAGNOSIS — Z125 Encounter for screening for malignant neoplasm of prostate: Secondary | ICD-10-CM | POA: Diagnosis not present

## 2021-06-25 DIAGNOSIS — I10 Essential (primary) hypertension: Secondary | ICD-10-CM | POA: Diagnosis not present

## 2021-06-25 DIAGNOSIS — R739 Hyperglycemia, unspecified: Secondary | ICD-10-CM | POA: Diagnosis not present

## 2021-07-21 DIAGNOSIS — Z1211 Encounter for screening for malignant neoplasm of colon: Secondary | ICD-10-CM | POA: Diagnosis not present

## 2021-07-21 DIAGNOSIS — M545 Low back pain, unspecified: Secondary | ICD-10-CM | POA: Diagnosis not present

## 2021-07-21 DIAGNOSIS — F17210 Nicotine dependence, cigarettes, uncomplicated: Secondary | ICD-10-CM | POA: Diagnosis not present

## 2021-07-21 DIAGNOSIS — F1190 Opioid use, unspecified, uncomplicated: Secondary | ICD-10-CM | POA: Diagnosis not present

## 2021-07-21 DIAGNOSIS — Z0000 Encounter for general adult medical examination without abnormal findings: Secondary | ICD-10-CM | POA: Diagnosis not present

## 2021-07-21 DIAGNOSIS — Z0001 Encounter for general adult medical examination with abnormal findings: Secondary | ICD-10-CM | POA: Diagnosis not present

## 2021-07-21 DIAGNOSIS — R739 Hyperglycemia, unspecified: Secondary | ICD-10-CM | POA: Diagnosis not present

## 2021-07-21 DIAGNOSIS — E7800 Pure hypercholesterolemia, unspecified: Secondary | ICD-10-CM | POA: Diagnosis not present

## 2021-07-21 DIAGNOSIS — I10 Essential (primary) hypertension: Secondary | ICD-10-CM | POA: Diagnosis not present

## 2021-10-21 DIAGNOSIS — M545 Low back pain, unspecified: Secondary | ICD-10-CM | POA: Diagnosis not present

## 2021-10-21 DIAGNOSIS — G8929 Other chronic pain: Secondary | ICD-10-CM | POA: Diagnosis not present

## 2022-01-19 DIAGNOSIS — R739 Hyperglycemia, unspecified: Secondary | ICD-10-CM | POA: Diagnosis not present

## 2022-01-26 DIAGNOSIS — G8929 Other chronic pain: Secondary | ICD-10-CM | POA: Diagnosis not present

## 2022-01-26 DIAGNOSIS — F17210 Nicotine dependence, cigarettes, uncomplicated: Secondary | ICD-10-CM | POA: Diagnosis not present

## 2022-01-26 DIAGNOSIS — I10 Essential (primary) hypertension: Secondary | ICD-10-CM | POA: Diagnosis not present

## 2022-01-26 DIAGNOSIS — E7800 Pure hypercholesterolemia, unspecified: Secondary | ICD-10-CM | POA: Diagnosis not present

## 2022-01-26 DIAGNOSIS — M545 Low back pain, unspecified: Secondary | ICD-10-CM | POA: Diagnosis not present

## 2022-01-26 DIAGNOSIS — R7303 Prediabetes: Secondary | ICD-10-CM | POA: Diagnosis not present

## 2022-01-26 DIAGNOSIS — F1190 Opioid use, unspecified, uncomplicated: Secondary | ICD-10-CM | POA: Diagnosis not present

## 2022-02-04 DIAGNOSIS — H524 Presbyopia: Secondary | ICD-10-CM | POA: Diagnosis not present

## 2022-02-04 DIAGNOSIS — H5203 Hypermetropia, bilateral: Secondary | ICD-10-CM | POA: Diagnosis not present

## 2022-03-08 DIAGNOSIS — C259 Malignant neoplasm of pancreas, unspecified: Secondary | ICD-10-CM

## 2022-03-08 HISTORY — DX: Malignant neoplasm of pancreas, unspecified: C25.9

## 2022-04-07 ENCOUNTER — Emergency Department: Payer: Medicare HMO

## 2022-04-07 ENCOUNTER — Inpatient Hospital Stay
Admission: EM | Admit: 2022-04-07 | Discharge: 2022-04-12 | DRG: 440 | Disposition: A | Discharge status: Home / Self Care | Payer: Medicare HMO | Attending: Student | Admitting: Student

## 2022-04-07 ENCOUNTER — Encounter: Payer: Self-pay | Admitting: Internal Medicine

## 2022-04-07 ENCOUNTER — Other Ambulatory Visit: Payer: Self-pay

## 2022-04-07 DIAGNOSIS — Z791 Long term (current) use of non-steroidal anti-inflammatories (NSAID): Secondary | ICD-10-CM | POA: Diagnosis not present

## 2022-04-07 DIAGNOSIS — R1032 Left lower quadrant pain: Secondary | ICD-10-CM

## 2022-04-07 DIAGNOSIS — Z720 Tobacco use: Secondary | ICD-10-CM | POA: Diagnosis present

## 2022-04-07 DIAGNOSIS — F17200 Nicotine dependence, unspecified, uncomplicated: Secondary | ICD-10-CM | POA: Diagnosis present

## 2022-04-07 DIAGNOSIS — R7303 Prediabetes: Secondary | ICD-10-CM | POA: Diagnosis present

## 2022-04-07 DIAGNOSIS — Z1152 Encounter for screening for COVID-19: Secondary | ICD-10-CM | POA: Diagnosis not present

## 2022-04-07 DIAGNOSIS — R109 Unspecified abdominal pain: Secondary | ICD-10-CM | POA: Diagnosis present

## 2022-04-07 DIAGNOSIS — Z79899 Other long term (current) drug therapy: Secondary | ICD-10-CM

## 2022-04-07 DIAGNOSIS — E785 Hyperlipidemia, unspecified: Secondary | ICD-10-CM | POA: Diagnosis present

## 2022-04-07 DIAGNOSIS — R739 Hyperglycemia, unspecified: Secondary | ICD-10-CM | POA: Diagnosis not present

## 2022-04-07 DIAGNOSIS — K859 Acute pancreatitis without necrosis or infection, unspecified: Principal | ICD-10-CM | POA: Diagnosis present

## 2022-04-07 DIAGNOSIS — N281 Cyst of kidney, acquired: Secondary | ICD-10-CM | POA: Diagnosis not present

## 2022-04-07 DIAGNOSIS — I10 Essential (primary) hypertension: Secondary | ICD-10-CM | POA: Diagnosis present

## 2022-04-07 DIAGNOSIS — K7689 Other specified diseases of liver: Secondary | ICD-10-CM | POA: Diagnosis not present

## 2022-04-07 LAB — SEDIMENTATION RATE: Sed Rate: 14 mm/hr (ref 0–20)

## 2022-04-07 LAB — LIPID PANEL
Cholesterol: 226 mg/dL — ABNORMAL HIGH (ref 0–200)
HDL: 40 mg/dL — ABNORMAL LOW (ref >40)
LDL Cholesterol: 172 mg/dL — ABNORMAL HIGH (ref 0–99)
Total CHOL/HDL Ratio: 5.7 RATIO
Triglycerides: 71 mg/dL (ref <150)
VLDL: 14 mg/dL (ref 0–40)

## 2022-04-07 LAB — URINALYSIS, ROUTINE W REFLEX MICROSCOPIC
Bacteria, UA: NONE SEEN
Bilirubin Urine: NEGATIVE
Glucose, UA: NEGATIVE mg/dL
Ketones, ur: NEGATIVE mg/dL
Leukocytes,Ua: NEGATIVE
Nitrite: NEGATIVE
Protein, ur: NEGATIVE mg/dL
Specific Gravity, Urine: 1.004 — ABNORMAL LOW (ref 1.005–1.030)
pH: 6 (ref 5.0–8.0)

## 2022-04-07 LAB — RESP PANEL BY RT-PCR (RSV, FLU A&B, COVID)  RVPGX2
Influenza A by PCR: NEGATIVE
Influenza B by PCR: NEGATIVE
Resp Syncytial Virus by PCR: NEGATIVE
SARS Coronavirus 2 by RT PCR: NEGATIVE

## 2022-04-07 LAB — LIPASE, BLOOD: Lipase: 39 U/L (ref 11–51)

## 2022-04-07 LAB — COMPREHENSIVE METABOLIC PANEL
ALT: 16 U/L (ref 0–44)
AST: 17 U/L (ref 15–41)
Albumin: 4.3 g/dL (ref 3.5–5.0)
Alkaline Phosphatase: 60 U/L (ref 38–126)
Anion gap: 7 (ref 5–15)
BUN: 12 mg/dL (ref 6–20)
CO2: 26 mmol/L (ref 22–32)
Calcium: 9.1 mg/dL (ref 8.9–10.3)
Chloride: 108 mmol/L (ref 98–111)
Creatinine, Ser: 0.75 mg/dL (ref 0.61–1.24)
GFR, Estimated: >60 mL/min (ref >60)
Glucose, Bld: 110 mg/dL — ABNORMAL HIGH (ref 70–99)
Potassium: 4 mmol/L (ref 3.5–5.1)
Sodium: 141 mmol/L (ref 135–145)
Total Bilirubin: 0.5 mg/dL (ref 0.3–1.2)
Total Protein: 7.8 g/dL (ref 6.5–8.1)

## 2022-04-07 LAB — CBC WITH DIFFERENTIAL/PLATELET
Abs Immature Granulocytes: 0.03 10*3/uL (ref 0.00–0.07)
Basophils Absolute: 0.1 10*3/uL (ref 0.0–0.1)
Basophils Relative: 1 %
Eosinophils Absolute: 0.1 10*3/uL (ref 0.0–0.5)
Eosinophils Relative: 1 %
HCT: 43.1 % (ref 39.0–52.0)
Hemoglobin: 14.3 g/dL (ref 13.0–17.0)
Immature Granulocytes: 0 %
Lymphocytes Relative: 27 %
Lymphs Abs: 3 10*3/uL (ref 0.7–4.0)
MCH: 29.1 pg (ref 26.0–34.0)
MCHC: 33.2 g/dL (ref 30.0–36.0)
MCV: 87.6 fL (ref 80.0–100.0)
Monocytes Absolute: 0.5 10*3/uL (ref 0.1–1.0)
Monocytes Relative: 4 %
Neutro Abs: 7.5 10*3/uL (ref 1.7–7.7)
Neutrophils Relative %: 67 %
Platelets: 305 10*3/uL (ref 150–400)
RBC: 4.92 MIL/uL (ref 4.22–5.81)
RDW: 12.9 % (ref 11.5–15.5)
WBC: 11.1 10*3/uL — ABNORMAL HIGH (ref 4.0–10.5)
nRBC: 0 % (ref 0.0–0.2)

## 2022-04-07 LAB — LACTIC ACID, PLASMA: Lactic Acid, Venous: 0.9 mmol/L (ref 0.5–1.9)

## 2022-04-07 MED ORDER — SODIUM CHLORIDE 0.9 % IV BOLUS
1000.0000 mL | Freq: Once | INTRAVENOUS | Status: AC
  Administered 2022-04-07: 1000 mL via INTRAVENOUS

## 2022-04-07 MED ORDER — IOHEXOL 300 MG/ML  SOLN
100.0000 mL | Freq: Once | INTRAMUSCULAR | Status: AC | PRN
  Administered 2022-04-07: 100 mL via INTRAVENOUS

## 2022-04-07 MED ORDER — NICOTINE 21 MG/24HR TD PT24
21.0000 mg | MEDICATED_PATCH | Freq: Every day | TRANSDERMAL | Status: DC
  Administered 2022-04-07 – 2022-04-12 (×6): 21 mg via TRANSDERMAL
  Filled 2022-04-07 (×6): qty 1

## 2022-04-07 MED ORDER — FENTANYL CITRATE PF 50 MCG/ML IJ SOSY
50.0000 ug | PREFILLED_SYRINGE | INTRAMUSCULAR | Status: DC | PRN
  Administered 2022-04-07 – 2022-04-12 (×21): 50 ug via INTRAVENOUS
  Filled 2022-04-07 (×21): qty 1

## 2022-04-07 MED ORDER — FENTANYL CITRATE PF 50 MCG/ML IJ SOSY
50.0000 ug | PREFILLED_SYRINGE | Freq: Once | INTRAMUSCULAR | Status: AC
  Administered 2022-04-07: 50 ug via INTRAVENOUS
  Filled 2022-04-07: qty 1

## 2022-04-07 MED ORDER — ONDANSETRON HCL 4 MG/2ML IJ SOLN
4.0000 mg | Freq: Once | INTRAMUSCULAR | Status: AC
  Administered 2022-04-07: 4 mg via INTRAVENOUS
  Filled 2022-04-07: qty 2

## 2022-04-07 MED ORDER — PANTOPRAZOLE 80MG IVPB - SIMPLE MED
80.0000 mg | Freq: Once | INTRAVENOUS | Status: AC
  Administered 2022-04-07: 80 mg via INTRAVENOUS
  Filled 2022-04-07: qty 100

## 2022-04-07 MED ORDER — HYDRALAZINE HCL 20 MG/ML IJ SOLN: 10.0000 mg | INTRAMUSCULAR | Status: DC | PRN

## 2022-04-07 MED ORDER — HEPARIN SODIUM (PORCINE) 5000 UNIT/ML IJ SOLN
5000.0000 [IU] | Freq: Three times a day (TID) | INTRAMUSCULAR | Status: DC
  Administered 2022-04-07 – 2022-04-12 (×14): 5000 [IU] via SUBCUTANEOUS
  Filled 2022-04-07 (×14): qty 1

## 2022-04-07 MED ORDER — LACTATED RINGERS IV SOLN: INTRAVENOUS | Status: DC

## 2022-04-07 MED ORDER — LACTATED RINGERS IV SOLN: INTRAVENOUS | Status: AC

## 2022-04-07 MED ORDER — FENTANYL CITRATE PF 50 MCG/ML IJ SOSY: 50.0000 ug | PREFILLED_SYRINGE | Freq: Three times a day (TID) | INTRAMUSCULAR | Status: DC | PRN

## 2022-04-07 MED ORDER — SODIUM CHLORIDE 0.9 % IV SOLN: INTRAVENOUS | Status: DC

## 2022-04-07 MED ORDER — SODIUM CHLORIDE 0.9% FLUSH
3.0000 mL | Freq: Two times a day (BID) | INTRAVENOUS | Status: DC
  Administered 2022-04-07 – 2022-04-12 (×9): 3 mL via INTRAVENOUS

## 2022-04-07 MED ORDER — ONDANSETRON 4 MG PO TBDP
4.0000 mg | ORAL_TABLET | Freq: Once | ORAL | Status: AC
  Administered 2022-04-07: 4 mg via ORAL
  Filled 2022-04-07: qty 1

## 2022-04-07 MED ORDER — FENTANYL CITRATE PF 50 MCG/ML IJ SOSY
100.0000 ug | PREFILLED_SYRINGE | Freq: Once | INTRAMUSCULAR | Status: AC
  Administered 2022-04-07: 100 ug via INTRAVENOUS
  Filled 2022-04-07: qty 2

## 2022-04-07 NOTE — ED Provider Notes (Signed)
[facility] Provider Note    Event Date/Time   First MD Initiated Contact with Patient 04/07/22 1808     (approximate)   History   Abdominal Pain (LEFT sided abdominal pain with NV that began 2 weeks ago; No reported fevers, no pain with urinating)   HPI  JABREE REBERT is a 62 y.o. male  with history of hypertension and as listed in EMR presents to the emergency department for treatment and evaluation of left lower quadrant abdominal pain with nausea and vomiting x 2 weeks. Pain is constant. No fever or blood in stool. No history of diverticulitis or kidney stone.      Physical Exam   Triage Vital Signs: ED Triage Vitals  Enc Vitals Group     BP 04/07/22 1655 (!) 151/91     Pulse Rate 04/07/22 1655 65     Resp 04/07/22 1655 18     Temp 04/07/22 1655 97.8 F (36.6 C)     Temp Source 04/07/22 1655 Oral     SpO2 04/07/22 1655 98 %     Weight 04/07/22 1656 170 lb (77.1 kg)     Height 04/07/22 1656 5\' 7"  (1.702 m)     Head Circumference --      Peak Flow --      Pain Score 04/07/22 1656 8     Pain Loc --      Pain Edu? --      Excl. in [place]? --     Most recent vital signs: Vitals:   04/07/22 1655 04/07/22 1916  BP: (!) 151/91 131/73  Pulse: 65 68  Resp: 18 12  Temp: 97.8 F (36.6 C) 98 F (36.7 C)  SpO2: 98% 100%    General: Awake, no distress.  CV:  Good peripheral perfusion.  Resp:  Normal effort.  Abd:  No distention. No rebound tenderness or guarding. Other:     ED Results / Procedures / Treatments   Labs (all labs ordered are listed, but only abnormal results are displayed) Labs Reviewed  COMPREHENSIVE METABOLIC PANEL - Abnormal; Notable for the following components:      Result Value   Glucose, Bld 110 (*)    All other components within normal limits  CBC WITH DIFFERENTIAL/PLATELET - Abnormal; Notable for the following components:   WBC 11.1 (*)    All other components within normal limits  URINALYSIS, ROUTINE W REFLEX  MICROSCOPIC - Abnormal; Notable for the following components:   Color, Urine STRAW (*)    APPearance CLEAR (*)    Specific Gravity, Urine 1.004 (*)    Hgb urine dipstick MODERATE (*)    All other components within normal limits  LIPID PANEL - Abnormal; Notable for the following components:   Cholesterol 226 (*)    HDL 40 (*)    LDL Cholesterol 172 (*)    All other components within normal limits  RESP PANEL BY RT-PCR (RSV, FLU A&B, COVID)  RVPGX2  LIPASE, BLOOD  LACTIC ACID, PLASMA  COMPREHENSIVE METABOLIC PANEL  CBC  HIV ANTIBODY (ROUTINE TESTING W REFLEX)  LACTIC ACID, PLASMA  SEDIMENTATION RATE     EKG    RADIOLOGY  CT abdomen and pelvis shows acute pancreatitis. No abscess.  PROCEDURES:  Critical Care performed: No  Procedures   IMPRESSION / MDM / ASSESSMENT AND PLAN / ED COURSE   I have reviewed the triage note.  Differential diagnosis includes, but is not limited to, diverticulitis, ureterolithiasis, mesenteric adenitis, colitis,  ileus, small bowel obstruction  Patient's presentation is most consistent with acute illness / injury with system symptoms.  62 year old male presenting to the emergency department for treatment and evaluation of left lower quadrant abdominal pain x 2 weeks. See HPI for further details.   Labs are overall reassuring. Urinalysis shows moderate amount of Hgb, but is otherwise normal. Respiratory panel is negative.   IV fluids, zofran, and CT abdomen and pelvis ordered. Patient aware and agreeable to the plan.  CT shows acute pancreatitis without abscess or necrosis. Results discussed with the patient. Last drink of alcohol was over 20 years ago. No history of pancreatitis. Plan will be to admit for further evaluation and treatment. Dr. Posey Pronto with hospitalist service has accepted the patient.      FINAL CLINICAL IMPRESSION(S) / ED DIAGNOSES   Final diagnoses:  Acute pancreatitis without infection or necrosis, unspecified  pancreatitis type     Rx / DC Orders   ED Discharge Orders     None        Note:  This document was prepared using Dragon voice recognition software and may include unintentional dictation errors.   Victorino Dike, FNP 04/07/22 2118    Merlyn Lot, MD 04/07/22 [phone]

## 2022-04-07 NOTE — Assessment & Plan Note (Signed)
Pt has left sided abdominal pain, since 5 days with diarrhea and committing and both have stopped.  Pain is still present.pt has been taking meloxicam for 15 years or so and could not take celebrex. D/d include vascular abd pain more so than infectious . D/d also include microscopic colitis, PUD.  We will get lactic and follow. BP goal is 092 systolic at lowest.  Prn fentanyl. Stop all nsaids.  GI consult per am team. Do not think this is related to acute pancreatitis on ct, but still possible, ? Autoimmune pancreatitis.

## 2022-04-07 NOTE — ED Notes (Signed)
Pt to ED with wife for worsening LLQ abdominal pain since 2 weeks ago, 9/10 sharp stabbing. Unlabored respirations. Wrote to provider, is requesting IV pain meds.

## 2022-04-07 NOTE — H&P (Signed)
History and Physical    Chief Complaint: Left sided abdominal pain.    HISTORY OF PRESENT ILLNESS: David Harrell is an 62 y.o. male  presenting for :   Chief complaints: Abdominal pain.   Duration: 5 days.    Frequency:intermittent.    Location: Left sided.  Radiation: Nonradiating.    Aggravating: movement.    Associated factors: nausea/ vomiting and diarrhea.   Pt has  Past Medical History:  Diagnosis Date   Arthritis    Hiatal hernia    Hypertension      Review of Systems  Gastrointestinal:  Positive for abdominal pain, nausea and vomiting.  All other systems reviewed and are negative.  Allergies  Allergen Reactions   Aspirin    Penicillins    Tape    Vicodin [Hydrocodone-Acetaminophen]    Past Surgical History:  Procedure Laterality Date   JOINT REPLACEMENT         MEDICATIONS: No current outpatient medications   heparin  5,000 Units Subcutaneous Q8H   nicotine  21 mg Transdermal Daily   sodium chloride flush  3 mL Intravenous Q12H     sodium chloride     pantoprazole (PROTONIX) IV          ED Course: Pt in Ed pt is alert/awake and oriented.  Vitals:   04/07/22 1655 04/07/22 1656 04/07/22 1916  BP: (!) 151/91  131/73  Pulse: 65  68  Resp: 18  12  Temp: 97.8 F (36.6 C)  98 F (36.7 C)  TempSrc: Oral  Oral  SpO2: 98%  100%  Weight:  77.1 kg   Height:  5\' 7"  (1.702 m)    Total I/O In: 999 [IV Piggyback:999] Out: -  SpO2: 100 % Blood work in ed shows: Normal CMP with a glucose of 110. Normal CBC except white count of 11.1, lipase of 39. Respiratory panel negative for flu COVID and  RSV. Also shows moderate blood otherwise negative. Lactic acid requested and is pending.  Results for orders placed or performed during the hospital encounter of 04/07/22 (from the past 24 hour(s))  Comprehensive metabolic panel     Status: Abnormal   Collection Time: 04/07/22  5:17 PM  Result Value Ref Range   Sodium 141 135 - 145 mmol/L    Potassium 4.0 3.5 - 5.1 mmol/L   Chloride 108 98 - 111 mmol/L   CO2 26 22 - 32 mmol/L   Glucose, Bld 110 (H) 70 - 99 mg/dL   BUN 12 6 - 20 mg/dL   Creatinine, Ser 4.090.75 0.61 - 1.24 mg/dL   Calcium 9.1 8.9 - 81.110.3 mg/dL   Total Protein 7.8 6.5 - 8.1 g/dL   Albumin 4.3 3.5 - 5.0 g/dL   AST 17 15 - 41 U/L   ALT 16 0 - 44 U/L   Alkaline Phosphatase 60 38 - 126 U/L   Total Bilirubin 0.5 0.3 - 1.2 mg/dL   GFR, Estimated >91>60 >47>60 mL/min   Anion gap 7 5 - 15  Lipase, blood     Status: None   Collection Time: 04/07/22  5:17 PM  Result Value Ref Range   Lipase 39 11 - 51 U/L  CBC with Differential     Status: Abnormal   Collection Time: 04/07/22  5:17 PM  Result Value Ref Range   WBC 11.1 (H) 4.0 - 10.5 K/uL   RBC 4.92 4.22 - 5.81 MIL/uL   Hemoglobin 14.3 13.0 - 17.0 g/dL   HCT 82.943.1 56.239.0 -  52.0 %   MCV 87.6 80.0 - 100.0 fL   MCH 29.1 26.0 - 34.0 pg   MCHC 33.2 30.0 - 36.0 g/dL   RDW 12.9 11.5 - 15.5 %   Platelets 305 150 - 400 K/uL   nRBC 0.0 0.0 - 0.2 %   Neutrophils Relative % 67 %   Neutro Abs 7.5 1.7 - 7.7 K/uL   Lymphocytes Relative 27 %   Lymphs Abs 3.0 0.7 - 4.0 K/uL   Monocytes Relative 4 %   Monocytes Absolute 0.5 0.1 - 1.0 K/uL   Eosinophils Relative 1 %   Eosinophils Absolute 0.1 0.0 - 0.5 K/uL   Basophils Relative 1 %   Basophils Absolute 0.1 0.0 - 0.1 K/uL   Immature Granulocytes 0 %   Abs Immature Granulocytes 0.03 0.00 - 0.07 K/uL  Resp panel by RT-PCR (RSV, Flu A&B, Covid) Anterior Nasal Swab     Status: None   Collection Time: 04/07/22  5:17 PM   Specimen: Anterior Nasal Swab  Result Value Ref Range   SARS Coronavirus 2 by RT PCR NEGATIVE NEGATIVE   Influenza A by PCR NEGATIVE NEGATIVE   Influenza B by PCR NEGATIVE NEGATIVE   Resp Syncytial Virus by PCR NEGATIVE NEGATIVE  Lipid panel     Status: Abnormal   Collection Time: 04/07/22  5:17 PM  Result Value Ref Range   Cholesterol 226 (H) 0 - 200 mg/dL   Triglycerides 71 <150 mg/dL   HDL 40 (L) >40 mg/dL    Total CHOL/HDL Ratio 5.7 RATIO   VLDL 14 0 - 40 mg/dL   LDL Cholesterol 172 (H) 0 - 99 mg/dL  Urinalysis, Routine w reflex microscopic -Urine, Clean Catch     Status: Abnormal   Collection Time: 04/07/22  6:05 PM  Result Value Ref Range   Color, Urine STRAW (A) YELLOW   APPearance CLEAR (A) CLEAR   Specific Gravity, Urine 1.004 (L) 1.005 - 1.030   pH 6.0 5.0 - 8.0   Glucose, UA NEGATIVE NEGATIVE mg/dL   Hgb urine dipstick MODERATE (A) NEGATIVE   Bilirubin Urine NEGATIVE NEGATIVE   Ketones, ur NEGATIVE NEGATIVE mg/dL   Protein, ur NEGATIVE NEGATIVE mg/dL   Nitrite NEGATIVE NEGATIVE   Leukocytes,Ua NEGATIVE NEGATIVE   RBC / HPF 0-5 0 - 5 RBC/hpf   WBC, UA 0-5 0 - 5 WBC/hpf   Bacteria, UA NONE SEEN NONE SEEN   Squamous Epithelial / HPF 0-5 0 - 5 /HPF   Mucus PRESENT     Unresulted Labs (From admission, onward)     Start     Ordered   04/08/22 0500  Comprehensive metabolic panel  Tomorrow morning,   STAT        04/07/22 2021   04/08/22 0500  CBC  Tomorrow morning,   STAT        04/07/22 2021   04/07/22 2040  HIV Antibody (routine testing w rflx)  Once,   R        04/07/22 2040   04/07/22 2037  Lactic acid, plasma  Now then every 2 hours,   R      04/07/22 2036           Pt has received : Orders Placed This Encounter  Procedures   Resp panel by RT-PCR (RSV, Flu A&B, Covid) Anterior Nasal Swab    Standing Status:   Standing    Number of Occurrences:   1   CT ABDOMEN PELVIS W CONTRAST  Standing Status:   Standing    Number of Occurrences:   1    Order Specific Question:   Does the patient have a contrast media/X-ray dye allergy?    Answer:   No    Order Specific Question:   If indicated for the ordered procedure, I authorize the administration of contrast media per Radiology protocol    Answer:   Yes    Order Specific Question:   Is Oral Contrast requested for this exam?    Answer:   No oral contrast    Order Specific Question:   Reason for No Oral Contrast     Answer:   Not tolerating liquids   US Abdomen Limited RUQ (LIVER/GB)    Standing Status:   Standing    Number of Occurrences:   1    Order Specific Question:   Symptom/Reason for Exam    Answer:   Acute pancreatitis [577.0.ICD-9-CM]   Comprehensive metabolic panel    Standing Status:   Standing    Number of Occurrences:   1   Lipase, blood    Standing Status:   Standing    Number of Occurrences:   1   CBC with Differential    Standing Status:   Standing    Number of Occurrences:   1   Urinalysis, Routine w reflex microscopic -Urine, Clean Catch    Standing Status:   Standing    Number of Occurrences:   1    Order Specific Question:   Specimen Source    Answer:   Urine, Clean Catch [76]   Lipid panel    Standing Status:   Standing    Number of Occurrences:   1   Comprehensive metabolic panel    Standing Status:   Standing    Number of Occurrences:   1   CBC    Standing Status:   Standing    Number of Occurrences:   1   HIV Antibody (routine testing w rflx)    Standing Status:   Standing    Number of Occurrences:   1   Lactic acid, plasma    Standing Status:   Standing    Number of Occurrences:   2   Diet NPO time specified Except for: Sips with Meds    Standing Status:   Standing    Number of Occurrences:   1    Order Specific Question:   Except for    Answer:   Sips with Meds   Maintain IV access    Standing Status:   Standing    Number of Occurrences:   1   Vital signs    Standing Status:   Standing    Number of Occurrences:   1   Notify physician (specify)    Standing Status:   Standing    Number of Occurrences:   20    Order Specific Question:   Notify Physician    Answer:   for pulse less than 55 or greater than 120    Order Specific Question:   Notify Physician    Answer:   for respiratory rate less than 12 or greater than 25    Order Specific Question:   Notify Physician    Answer:   for temperature greater than 100.5 F    Order Specific Question:   Notify  Physician    Answer:   for urinary output less than 30 mL/hr for four hours    Order Specific Question:  Notify Physician    Answer:   for systolic BP less than 90 or greater than 160, diastolic BP less than 60 or greater than 100    Order Specific Question:   Notify Physician    Answer:   for new hypoxia w/ oxygen saturations < 88%   Progressive Mobility Protocol: No Restrictions    Standing Status:   Standing    Number of Occurrences:   1   Daily weights    Standing Status:   Standing    Number of Occurrences:   1   Intake and Output    Standing Status:   Standing    Number of Occurrences:   1   Initiate Oral Care Protocol    Standing Status:   Standing    Number of Occurrences:   1   Initiate Carrier Fluid Protocol    Standing Status:   Standing    Number of Occurrences:   1   RN may order General Admission PRN Orders utilizing "General Admission PRN medications" (through manage orders) for the following patient needs: allergy symptoms (Claritin), cold sores (Carmex), cough (Robitussin DM), eye irritation (Liquifilm Tears), hemorrhoids (Tucks), indigestion (Maalox), minor skin irritation (Hydrocortisone Cream), muscle pain (Ben Gay), nose irritation (saline nasal spray) and sore throat (Chloraseptic spray).    Standing Status:   Standing    Number of Occurrences:   [phone]   Full code    Standing Status:   Standing    Number of Occurrences:   1    Order Specific Question:   By:    Answer:   Other   Consult to hospitalist    Standing Status:   Standing    Number of Occurrences:   1    Order Specific Question:   Place call to:    Answer:   [phone]    Order Specific Question:   Reason for Consult    Answer:   Admit    Order Specific Question:   Diagnosis/Clinical Info for Consult:    Answer:   Acute Pancreatitis   Pulse oximetry check with vital signs    Standing Status:   Standing    Number of Occurrences:   1   Oxygen therapy Mode or (Route): Nasal cannula; Liters Per  Minute: 2; Keep 02 saturation: greater than 92 %    Standing Status:   Standing    Number of Occurrences:   20    Order Specific Question:   Mode or (Route)    Answer:   Nasal cannula    Order Specific Question:   Liters Per Minute    Answer:   2    Order Specific Question:   Keep 02 saturation    Answer:   greater than 92 %   ED EKG    Standing Status:   Standing    Number of Occurrences:   1    Order Specific Question:   Reason for Exam    Answer:   Chest Pain   Place in observation (patient's expected length of stay will be less than 2 midnights)    Standing Status:   Standing    Number of Occurrences:   1    Order Specific Question:   Hospital Area    Answer:   [facility] [[id number]]    Order Specific Question:   Level of Care    Answer:   Med-Surg [16]    Order Specific Question:   Covid Evaluation  Answer:   Asymptomatic - no recent exposure (last 10 days) testing not required    Order Specific Question:   Diagnosis    Answer:   Abdominal pain [301601]    Order Specific Question:   Admitting Physician    Answer:   Darrold Junker    Order Specific Question:   Attending Physician    Answer:   Darrold Junker    Meds ordered this encounter  Medications   ondansetron (ZOFRAN-ODT) disintegrating tablet 4 mg   sodium chloride 0.9 % bolus 1,000 mL   ondansetron (ZOFRAN) injection 4 mg   iohexol (OMNIPAQUE) 300 MG/ML solution 100 mL   fentaNYL (SUBLIMAZE) injection 50 mcg   fentaNYL (SUBLIMAZE) injection 100 mcg   pantoprazole (PROTONIX) 80 mg /NS 100 mL IVPB   heparin injection 5,000 Units   sodium chloride flush (NS) 0.9 % injection 3 mL   0.9 %  sodium chloride infusion   nicotine (NICODERM CQ - dosed in mg/24 hours) patch 21 mg   hydrALAZINE (APRESOLINE) injection 10 mg   fentaNYL (SUBLIMAZE) injection 50 mcg     Admission Imaging : CT ABDOMEN PELVIS W CONTRAST  Result Date: 04/07/2022 CLINICAL DATA:  Left lower quadrant  abdominal pain, nausea/vomiting EXAM: CT ABDOMEN AND PELVIS WITH CONTRAST TECHNIQUE: Multidetector CT imaging of the abdomen and pelvis was performed using the standard protocol following bolus administration of intravenous contrast. RADIATION DOSE REDUCTION: This exam was performed according to the departmental dose-optimization program which includes automated exposure control, adjustment of the mA and/or kV according to patient size and/or use of iterative reconstruction technique. CONTRAST:  OMNIPAQUE IOHEXOL 300 MG/ML  SOLN COMPARISON:  11/27/2018 FINDINGS: Lower chest: Lung bases are clear. Hepatobiliary: Subcentimeter cyst in segment 4 (series 2/image 8), benign. Gallbladder is unremarkable. No intrahepatic or extrahepatic ductal dilatation. Pancreas: Peripancreatic inflammatory changes along the mid pancreatic tail (series 2/image 16), suggesting acute pancreatitis. No drainable fluid collection/walled-off necrosis. Spleen: Within normal limits. Adrenals/Urinary Tract: Adrenal glands are within normal limits. 16 mm simple medial left upper pole renal cyst (series 2/image 12), benign (Bosniak I). No follow-up is recommended. Right kidney is within normal limits. No hydronephrosis. Bladder is mildly thick-walled although underdistended. Stomach/Bowel: Stomach is within normal limits. No evidence of bowel obstruction. Normal appendix (series 2/image 44). No colonic wall thickening or inflammatory changes. Vascular/Lymphatic: No evidence of abdominal aortic aneurysm. Atherosclerotic calcifications of the abdominal aorta and branch vessels. No suspicious abdominopelvic lymphadenopathy. Reproductive: Prostatomegaly, with enlargement of the central gland indenting the base of the bladder, reflecting BPH. Other: No abdominopelvic ascites. Musculoskeletal: Status post PLIF at L5-S1. IMPRESSION: Acute pancreatitis. No drainable fluid collection/walled-off necrosis. Additional ancillary findings as above.  Electronically Signed   By: Charline Bills M.D.   On: 04/07/2022 19:22    Physical Examination: Vitals:   04/07/22 1655 04/07/22 1656 04/07/22 1916  BP: (!) 151/91  131/73  Pulse: 65  68  Temp: 97.8 F (36.6 C)  98 F (36.7 C)  Resp: 18  12  Height:  5\' 7"  (1.702 m)   Weight:  77.1 kg   SpO2: 98%  100%  TempSrc: Oral  Oral  BMI (Calculated):  26.62    Physical Exam Vitals reviewed.  Constitutional:      General: He is not in acute distress.    Appearance: He is not ill-appearing, toxic-appearing or diaphoretic.  HENT:     Head: Normocephalic and atraumatic.     Right Ear: Hearing and external ear  normal.     Left Ear: Hearing and external ear normal.     Nose: Nose normal. No nasal deformity.     Mouth/Throat:     Lips: Pink.     Mouth: Mucous membranes are moist.     Tongue: No lesions.     Pharynx: Oropharynx is clear.  Eyes:     Extraocular Movements: Extraocular movements intact.  Cardiovascular:     Rate and Rhythm: Normal rate and regular rhythm.     Pulses: Normal pulses.     Heart sounds: Normal heart sounds.  Pulmonary:     Effort: Pulmonary effort is normal.     Breath sounds: Normal breath sounds.  Abdominal:     General: Bowel sounds are normal. There is no distension.     Palpations: Abdomen is soft. There is no mass.     Tenderness: There is abdominal tenderness. There is guarding.     Hernia: No hernia is present.  Musculoskeletal:     Right lower leg: No edema.     Left lower leg: No edema.  Skin:    General: Skin is warm.  Neurological:     General: No focal deficit present.     Mental Status: He is alert and oriented to person, place, and time.     Cranial Nerves: Cranial nerves 2-12 are intact.     Motor: Motor function is intact.  Psychiatric:        Attention and Perception: Attention normal.        Mood and Affect: Mood normal.        Speech: Speech normal.        Behavior: Behavior normal. Behavior is cooperative.         Cognition and Memory: Cognition normal.       Assessment and Plan: * Abdominal pain Pt has left sided abdominal pain, since 5 days with diarrhea and committing and both have stopped.  Pain is still present.pt has been taking meloxicam for 15 years or so and could not take celebrex. D/d include vascular abd pain more so than infectious . D/d also include microscopic colitis, PUD.  We will get lactic and follow. BP goal is 532 systolic at lowest.  Prn fentanyl. Stop all nsaids.  GI consult per am team. Do not think this is related to acute pancreatitis on ct, but still possible, ? Autoimmune pancreatitis.     Hyperglycemia Monitor and follow.   Hypertension Prn hydralazine only for systolic goal of 992'E.   Current tobacco use Counseling once patient is stable. Nicotine patch.    DVT prophylaxis:  heparin  Code Status:  Full code   Family Communication:  Urias,CECILIA M (Spouse) [phone]   Disposition Plan:  Home   Consults called:  None  Admission status: Observation   Unit/ Expected LOS: Med tele/ 1-2 days.    Para Skeans MD [facility]  6 PM- 2 AM. Please contact me via secure Chat 6 PM-2 AM. [phone]( Pager ) To contact the [facility] Attending or Consulting provider [place] or covering provider during after hours [place], for this patient.   Check the care team in [facility] and look for a) attending/consulting [facility] provider listed and b) the [facility] team listed Log into [email or web address] and use [place]'s universal password to access. If you do not have the password, please contact the hospital operator. Locate the [facility] provider you are looking for under [facility] and page to a number that you  can be directly reached. If you still have difficulty reaching the provider, please page the [facility] (Director on Call) for the Hospitalists listed on amion for assistance. [email or web address] 04/07/2022, 8:56 PM

## 2022-04-07 NOTE — Assessment & Plan Note (Signed)
Counseling once patient is stable. Nicotine patch.

## 2022-04-07 NOTE — Assessment & Plan Note (Signed)
Prn hydralazine only for systolic goal of 803'O.

## 2022-04-07 NOTE — Progress Notes (Addendum)
Pt is complaining of 7/10 abdominal pain. PRN Fentanyl was administered but pt still having pain. Pt also added" I take alprazolam 1 mg at home twice a day. Alprazolam not order. NP Randol Kern made aware. Will continue to monitor.  Update 0030: MD Posey Pronto discontinue 0.9 sodium chloride and placed order for Lactated Ringer 100 ml/hr. NP Randol Kern place order for Fentanyl IV 50 mcg every 2 hours PRN for severe pain. Will continue to monitor.

## 2022-04-07 NOTE — ED Triage Notes (Signed)
LEFT sided abdominal pain with NV that began 2 weeks ago; No reported fevers, no pain with urinating

## 2022-04-07 NOTE — Assessment & Plan Note (Signed)
Monitor and follow.

## 2022-04-07 NOTE — ED Notes (Signed)
Hospitalist at bedside 

## 2022-04-07 NOTE — ED Notes (Signed)
Ultrasound at bedside

## 2022-04-08 DIAGNOSIS — F17200 Nicotine dependence, unspecified, uncomplicated: Secondary | ICD-10-CM | POA: Diagnosis present

## 2022-04-08 DIAGNOSIS — K859 Acute pancreatitis without necrosis or infection, unspecified: Secondary | ICD-10-CM | POA: Diagnosis present

## 2022-04-08 DIAGNOSIS — Z791 Long term (current) use of non-steroidal anti-inflammatories (NSAID): Secondary | ICD-10-CM | POA: Diagnosis not present

## 2022-04-08 DIAGNOSIS — R109 Unspecified abdominal pain: Secondary | ICD-10-CM | POA: Diagnosis present

## 2022-04-08 DIAGNOSIS — Z79899 Other long term (current) drug therapy: Secondary | ICD-10-CM | POA: Diagnosis not present

## 2022-04-08 DIAGNOSIS — Z1152 Encounter for screening for COVID-19: Secondary | ICD-10-CM | POA: Diagnosis not present

## 2022-04-08 DIAGNOSIS — R1032 Left lower quadrant pain: Secondary | ICD-10-CM | POA: Diagnosis not present

## 2022-04-08 DIAGNOSIS — R7303 Prediabetes: Secondary | ICD-10-CM | POA: Diagnosis present

## 2022-04-08 DIAGNOSIS — E785 Hyperlipidemia, unspecified: Secondary | ICD-10-CM | POA: Diagnosis present

## 2022-04-08 DIAGNOSIS — I10 Essential (primary) hypertension: Secondary | ICD-10-CM | POA: Diagnosis present

## 2022-04-08 DIAGNOSIS — R739 Hyperglycemia, unspecified: Secondary | ICD-10-CM | POA: Diagnosis present

## 2022-04-08 LAB — CBC
HCT: 36 % — ABNORMAL LOW (ref 39.0–52.0)
Hemoglobin: 12.4 g/dL — ABNORMAL LOW (ref 13.0–17.0)
MCH: 29.9 pg (ref 26.0–34.0)
MCHC: 34.4 g/dL (ref 30.0–36.0)
MCV: 86.7 fL (ref 80.0–100.0)
Platelets: 248 10*3/uL (ref 150–400)
RBC: 4.15 MIL/uL — ABNORMAL LOW (ref 4.22–5.81)
RDW: 12.7 % (ref 11.5–15.5)
WBC: 11.2 10*3/uL — ABNORMAL HIGH (ref 4.0–10.5)
nRBC: 0 % (ref 0.0–0.2)

## 2022-04-08 LAB — COMPREHENSIVE METABOLIC PANEL
ALT: 15 U/L (ref 0–44)
AST: 18 U/L (ref 15–41)
Albumin: 3.6 g/dL (ref 3.5–5.0)
Alkaline Phosphatase: 51 U/L (ref 38–126)
Anion gap: 6 (ref 5–15)
BUN: 11 mg/dL (ref 6–20)
CO2: 24 mmol/L (ref 22–32)
Calcium: 8.2 mg/dL — ABNORMAL LOW (ref 8.9–10.3)
Chloride: 109 mmol/L (ref 98–111)
Creatinine, Ser: 0.93 mg/dL (ref 0.61–1.24)
GFR, Estimated: >60 mL/min (ref >60)
Glucose, Bld: 119 mg/dL — ABNORMAL HIGH (ref 70–99)
Potassium: 3.7 mmol/L (ref 3.5–5.1)
Sodium: 139 mmol/L (ref 135–145)
Total Bilirubin: 0.6 mg/dL (ref 0.3–1.2)
Total Protein: 6.2 g/dL — ABNORMAL LOW (ref 6.5–8.1)

## 2022-04-08 LAB — LACTIC ACID, PLASMA: Lactic Acid, Venous: 1.3 mmol/L (ref 0.5–1.9)

## 2022-04-08 LAB — HIV ANTIBODY (ROUTINE TESTING W REFLEX): HIV Screen 4th Generation wRfx: NONREACTIVE

## 2022-04-08 MED ORDER — ROSUVASTATIN CALCIUM 10 MG PO TABS
20.0000 mg | ORAL_TABLET | Freq: Every day | ORAL | Status: DC
  Administered 2022-04-08 – 2022-04-12 (×5): 20 mg via ORAL
  Filled 2022-04-08 (×5): qty 2

## 2022-04-08 MED ORDER — ACETAMINOPHEN 325 MG PO TABS: 650.0000 mg | ORAL_TABLET | Freq: Four times a day (QID) | ORAL | Status: DC | PRN

## 2022-04-08 MED ORDER — LISINOPRIL 20 MG PO TABS
20.0000 mg | ORAL_TABLET | Freq: Every day | ORAL | Status: DC
  Administered 2022-04-08 – 2022-04-11 (×4): 20 mg via ORAL
  Filled 2022-04-08 (×5): qty 1

## 2022-04-08 MED ORDER — MORPHINE SULFATE ER 15 MG PO TBCR
15.0000 mg | EXTENDED_RELEASE_TABLET | Freq: Every day | ORAL | Status: DC
  Administered 2022-04-08 – 2022-04-11 (×4): 15 mg via ORAL
  Filled 2022-04-08 (×4): qty 1

## 2022-04-08 MED ORDER — ONDANSETRON HCL 4 MG/2ML IJ SOLN
4.0000 mg | Freq: Four times a day (QID) | INTRAMUSCULAR | Status: DC | PRN
  Administered 2022-04-08: 4 mg via INTRAVENOUS
  Filled 2022-04-08: qty 2

## 2022-04-08 MED ORDER — ALPRAZOLAM 0.5 MG PO TABS
1.0000 mg | ORAL_TABLET | Freq: Two times a day (BID) | ORAL | Status: DC | PRN
  Administered 2022-04-08 – 2022-04-11 (×6): 1 mg via ORAL
  Filled 2022-04-08 (×7): qty 2

## 2022-04-08 MED ORDER — PANTOPRAZOLE SODIUM 40 MG PO TBEC
40.0000 mg | DELAYED_RELEASE_TABLET | Freq: Two times a day (BID) | ORAL | Status: DC
  Administered 2022-04-08 – 2022-04-12 (×9): 40 mg via ORAL
  Filled 2022-04-08 (×9): qty 1

## 2022-04-08 MED ORDER — MORPHINE SULFATE ER 15 MG PO TBCR
30.0000 mg | EXTENDED_RELEASE_TABLET | Freq: Every day | ORAL | Status: DC
  Administered 2022-04-08 – 2022-04-11 (×4): 30 mg via ORAL
  Filled 2022-04-08 (×4): qty 2

## 2022-04-08 NOTE — Progress Notes (Signed)
[facility] Progress Note  Patient: ABDURAHMAN Harrell    TJQ:[id number]  DOA: 04/07/2022     Date of Service: the patient was seen and examined on 04/08/2022  Chief Complaint  Patient presents with   Abdominal Pain    LEFT sided abdominal pain with NV that began 2 weeks ago; No reported fevers, no pain with urinating   Brief hospital course: AMI THORNSBERRY is a 62 y.o. male  with PMHx of hypertension, HLD as reviewed in EMR who presents to the [facility] ED for treatment and evaluation of left lower quadrant abdominal pain with nausea and vomiting x 2 weeks. Pain is constant. No fever or blood in stool. No history of diverticulitis or kidney stone.    Assessment and Plan:  # Acute pancreatitis Patient presented with abdominal pain associated with nausea and vomiting for about 2 weeks.  Pain is constant and getting worse. CT a/p shows inflammation of pancreatic tail, pancreatitis. Lipase within normal range DD ischemic colitis, versus microscopic colitis and PUD (patient takes meloxicam for past 15 years) recommended to avoid NSAIDs Patient was n.p.o., feeling hungry, started clear liquid diet Continue IV fluid for hydration Resumed pain medications We will gradually advance diet as per tolerance Started PPI 40 mg p.o. twice daily Leukocytosis, most likely reactive.  Continue to trend    Hyperglycemia, mild Monitor and follow.    Hypertension and HLD Resume lisinopril 20 mg pod and Crestor 20 mg pod Prn hydralazine only for systolic goal of 762'U.  LDL 172 Monitor BP and titrate medications accordingly   Current tobacco use Counseling once patient is stable. Nicotine patch.    Body mass index is 24.27 kg/m.  Interventions:       Diet: CLD DVT Prophylaxis: Subcutaneous Heparin    Advance goals of care discussion: Full code  Family Communication: family was not present at bedside, at the time of interview.  The pt provided permission to discuss medical plan with the  family. Opportunity was given to ask question and all questions were answered satisfactorily.   Disposition:  Pt is from Home, admitted with Abd pain most likely due to pancreatitis, still has significant pain on IV fluid and clear liquid diet, which precludes a safe discharge. Discharge to home, when clinically stable, may need few days to improve..  Subjective: No significant events overnight, patient is still complaining of abdominal pain on the left side 7/10, no nausea vomiting or diarrhea.  Patient is starving and would like diet to be started.  So started on clear liquid diet.  We will continue IV fluid for hydration and plan for disposition in few days.  Physical Exam: General: NAD, lying comfortably Appear in no distress, affect appropriate Eyes: PERRLA ENT: Oral Mucosa Clear, moist  Neck: no JVD,  Cardiovascular: S1 and S2 Present, no Murmur,  Respiratory: good respiratory effort, Bilateral Air entry equal and Decreased, no Crackles, no wheezes Abdomen: Bowel Sound present, Soft and mild left sided tenderness,  Skin: no rashes Extremities: no Pedal edema, no calf tenderness Neurologic: without any new focal findings Gait not checked due to patient safety concerns  Vitals:   04/07/22 2309 04/08/22 0423 04/08/22 0813 04/08/22 0935  BP: 132/73 (!) 147/73 134/78 104/70  Pulse: 60 (!) 58 64 66  Resp: 17 18  18   Temp: 98.1 F (36.7 C) 98 F (36.7 C) 98.4 F (36.9 C) 98.2 F (36.8 C)  TempSrc: Oral Oral Oral Oral  SpO2: 99% 99% 100% 98%  Weight: 70.3  kg     Height: 5\' 7"  (1.702 m)       Intake/Output Summary (Last 24 hours) at 04/08/2022 1346 Last data filed at 04/08/2022 0500 Gross per 24 hour  Intake 1486.89 ml  Output --  Net 1486.89 ml   Filed Weights   04/07/22 1656 04/07/22 2309  Weight: 77.1 kg 70.3 kg    Data Reviewed: I have personally reviewed and interpreted daily labs, tele strips, imagings as discussed above. I reviewed all nursing notes, pharmacy  notes, vitals, pertinent old records I have discussed plan of care as described above with RN and patient/family.  CBC: Recent Labs  Lab 04/07/22 1717 04/08/22 0428  WBC 11.1* 11.2*  NEUTROABS 7.5  --   HGB 14.3 12.4*  HCT 43.1 36.0*  MCV 87.6 86.7  PLT 305 409   Basic Metabolic Panel: Recent Labs  Lab 04/07/22 1717 04/08/22 0428  NA 141 139  K 4.0 3.7  CL 108 109  CO2 26 24  GLUCOSE 110* 119*  BUN 12 11  CREATININE 0.75 0.93  CALCIUM 9.1 8.2*    Studies: US Abdomen Limited RUQ (LIVER/GB)  Result Date: 04/07/2022 CLINICAL DATA:  Acute pancreatitis EXAM: ULTRASOUND ABDOMEN LIMITED RIGHT UPPER QUADRANT COMPARISON:  CT abdomen and pelvis 04/07/2022 FINDINGS: Gallbladder: No gallstones or wall thickening visualized. No sonographic Murphy sign noted by sonographer. Hyperechoic focus with comet tail artifact in the gallbladder fundus compatible with benign adenomyomatosis. Common bile duct: Diameter: 3 mm Liver: Hepatic cyst in the peripheral left lobe of the liver as seen on CT earlier today. No follow-up recommended. Within normal limits in parenchymal echogenicity. Portal vein is patent on color Doppler imaging with normal direction of blood flow towards the liver. Other: None. IMPRESSION: Unremarkable right upper quadrant ultrasound Electronically Signed   By: Placido Sou M.D.   On: 04/07/2022 21:03   CT ABDOMEN PELVIS W CONTRAST  Result Date: 04/07/2022 CLINICAL DATA:  Left lower quadrant abdominal pain, nausea/vomiting EXAM: CT ABDOMEN AND PELVIS WITH CONTRAST TECHNIQUE: Multidetector CT imaging of the abdomen and pelvis was performed using the standard protocol following bolus administration of intravenous contrast. RADIATION DOSE REDUCTION: This exam was performed according to the departmental dose-optimization program which includes automated exposure control, adjustment of the mA and/or kV according to patient size and/or use of iterative reconstruction technique.  CONTRAST:  142mL OMNIPAQUE IOHEXOL 300 MG/ML  SOLN COMPARISON:  11/27/2018 FINDINGS: Lower chest: Lung bases are clear. Hepatobiliary: Subcentimeter cyst in segment 4 (series 2/image 8), benign. Gallbladder is unremarkable. No intrahepatic or extrahepatic ductal dilatation. Pancreas: Peripancreatic inflammatory changes along the mid pancreatic tail (series 2/image 16), suggesting acute pancreatitis. No drainable fluid collection/walled-off necrosis. Spleen: Within normal limits. Adrenals/Urinary Tract: Adrenal glands are within normal limits. 16 mm simple medial left upper pole renal cyst (series 2/image 12), benign (Bosniak I). No follow-up is recommended. Right kidney is within normal limits. No hydronephrosis. Bladder is mildly thick-walled although underdistended. Stomach/Bowel: Stomach is within normal limits. No evidence of bowel obstruction. Normal appendix (series 2/image 44). No colonic wall thickening or inflammatory changes. Vascular/Lymphatic: No evidence of abdominal aortic aneurysm. Atherosclerotic calcifications of the abdominal aorta and branch vessels. No suspicious abdominopelvic lymphadenopathy. Reproductive: Prostatomegaly, with enlargement of the central gland indenting the base of the bladder, reflecting BPH. Other: No abdominopelvic ascites. Musculoskeletal: Status post PLIF at L5-S1. IMPRESSION: Acute pancreatitis. No drainable fluid collection/walled-off necrosis. Additional ancillary findings as above. Electronically Signed   By: Julian Hy M.D.   On: 04/07/2022 19:22  Scheduled Meds:  heparin  5,000 Units Subcutaneous Q8H   lisinopril  20 mg Oral Daily   morphine  15 mg Oral QHS   morphine  30 mg Oral Daily   nicotine  21 mg Transdermal Daily   rosuvastatin  20 mg Oral Daily   sodium chloride flush  3 mL Intravenous Q12H   Continuous Infusions:  lactated ringers 100 mL/hr at 04/08/22 0827   PRN Meds: acetaminophen, ALPRAZolam, fentaNYL (SUBLIMAZE) injection,  hydrALAZINE, ondansetron (ZOFRAN) IV  Time spent: 35 minutes  Author: Val Riles. MD [facility] 04/08/2022 1:46 PM  To reach On-call, see care teams to locate the attending and reach out to them via [email or web address]. If 7PM-7AM, please contact night-coverage If you still have difficulty reaching the attending provider, please page the [facility] (Director on Call) for [facility] on amion for assistance.

## 2022-04-08 NOTE — Plan of Care (Signed)
  Problem: Education: Goal: Knowledge of General Education information will improve Description: Including pain rating scale, medication(s)/side effects and non-pharmacologic comfort measures Outcome: Progressing   Problem: Clinical Measurements: Goal: Will remain free from infection Outcome: Progressing   Problem: Coping: Goal: Level of anxiety will decrease Outcome: Progressing   Problem: Elimination: Goal: Will not experience complications related to bowel motility Outcome: Progressing   Problem: Elimination: Goal: Will not experience complications related to urinary retention Outcome: Progressing   Problem: Pain Managment: Goal: General experience of comfort will improve Outcome: Progressing   Problem: Safety: Goal: Ability to remain free from injury will improve Outcome: Progressing

## 2022-04-09 DIAGNOSIS — R1032 Left lower quadrant pain: Secondary | ICD-10-CM | POA: Diagnosis not present

## 2022-04-09 LAB — CBC
HCT: 33.9 % — ABNORMAL LOW (ref 39.0–52.0)
Hemoglobin: 11.4 g/dL — ABNORMAL LOW (ref 13.0–17.0)
MCH: 29.5 pg (ref 26.0–34.0)
MCHC: 33.6 g/dL (ref 30.0–36.0)
MCV: 87.6 fL (ref 80.0–100.0)
Platelets: 221 10*3/uL (ref 150–400)
RBC: 3.87 MIL/uL — ABNORMAL LOW (ref 4.22–5.81)
RDW: 12.8 % (ref 11.5–15.5)
WBC: 8.1 10*3/uL (ref 4.0–10.5)
nRBC: 0 % (ref 0.0–0.2)

## 2022-04-09 LAB — PHOSPHORUS: Phosphorus: 4.1 mg/dL (ref 2.5–4.6)

## 2022-04-09 LAB — BASIC METABOLIC PANEL
Anion gap: 6 (ref 5–15)
BUN: 7 mg/dL (ref 6–20)
CO2: 28 mmol/L (ref 22–32)
Calcium: 8.4 mg/dL — ABNORMAL LOW (ref 8.9–10.3)
Chloride: 108 mmol/L (ref 98–111)
Creatinine, Ser: 0.84 mg/dL (ref 0.61–1.24)
GFR, Estimated: >60 mL/min (ref >60)
Glucose, Bld: 97 mg/dL (ref 70–99)
Potassium: 4.1 mmol/L (ref 3.5–5.1)
Sodium: 142 mmol/L (ref 135–145)

## 2022-04-09 LAB — HEPATIC FUNCTION PANEL
ALT: 15 U/L (ref 0–44)
AST: 21 U/L (ref 15–41)
Albumin: 3.5 g/dL (ref 3.5–5.0)
Alkaline Phosphatase: 48 U/L (ref 38–126)
Bilirubin, Direct: 0.1 mg/dL (ref 0.0–0.2)
Indirect Bilirubin: 0.6 mg/dL (ref 0.3–0.9)
Total Bilirubin: 0.7 mg/dL (ref 0.3–1.2)
Total Protein: 6 g/dL — ABNORMAL LOW (ref 6.5–8.1)

## 2022-04-09 LAB — LIPASE, BLOOD: Lipase: 39 U/L (ref 11–51)

## 2022-04-09 LAB — MAGNESIUM: Magnesium: 2.2 mg/dL (ref 1.7–2.4)

## 2022-04-09 MED ORDER — LACTATED RINGERS IV SOLN: INTRAVENOUS | Status: DC

## 2022-04-09 NOTE — Progress Notes (Signed)
[facility] Progress Note  [name]    GYB:[id number]  DOA: 04/07/2022     Date of Service: the patient was seen and examined on 04/09/2022  Chief Complaint  Patient presents with   Abdominal Pain    LEFT sided abdominal pain with NV that began 2 weeks ago; No reported fevers, no pain with urinating   Brief hospital course: MURRY DIAZ is a 62 y.o. male  with PMHx of hypertension, HLD as reviewed in EMR who presents to the [facility] ED for treatment and evaluation of left lower quadrant abdominal pain with nausea and vomiting x 2 weeks. Pain is constant. No fever or blood in stool. No history of diverticulitis or kidney stone.    Assessment and Plan:  # Acute pancreatitis Patient presented with abdominal pain associated with nausea and vomiting for about 2 weeks.  Pain is constant and getting worse. CT a/p shows inflammation of pancreatic tail, pancreatitis. Lipase within normal range DD ischemic colitis, versus microscopic colitis and PUD (patient takes meloxicam for past 15 years) recommended to avoid NSAIDs Continue IV fluid for hydration Resumed pain medications We will gradually advance diet as per tolerance Started PPI 40 mg p.o. twice daily Leukocytosis, most likely reactive.  Continue to trend 2/2 advance diet to full liquid   Hyperglycemia, mild Monitor and follow.    Hypertension and HLD Resume lisinopril 20 mg pod and Crestor 20 mg pod Prn hydralazine only for systolic goal of 876'O.  LDL 172 Monitor BP and titrate medications accordingly   Current tobacco use Counseling once patient is stable. Nicotine patch.   Body mass index is 24.27 kg/m.  Interventions:      Diet: CLD DVT Prophylaxis: Subcutaneous Heparin    Advance goals of care discussion: Full code  Family Communication: family was not present at bedside, at the time of interview.  The pt provided permission to discuss medical plan with the family. Opportunity was given to ask  question and all questions were answered satisfactorily.   Disposition:  Pt is from Home, admitted with Abd pain most likely due to pancreatitis, still has significant pain on IV fluid and full liquid diet, which precludes a safe discharge. Discharge to home, when clinically stable, may need few days to improve..  Subjective: No significant events overnight, patient has been 1/15, periumbilical and on the left side.  Denies any nausea vomiting, no diarrhea.  Patient did tolerated clear liquid diet, is willing to advance to full liquid.  Physical Exam: General: NAD, lying comfortably Appear in no distress, affect appropriate Eyes: PERRLA ENT: Oral Mucosa Clear, moist  Neck: no JVD,  Cardiovascular: S1 and S2 Present, no Murmur,  Respiratory: good respiratory effort, Bilateral Air entry equal and Decreased, no Crackles, no wheezes Abdomen: Bowel Sound present, Soft and mild periumbilical and left sided tenderness,  Skin: no rashes Extremities: no Pedal edema, no calf tenderness Neurologic: without any new focal findings Gait not checked due to patient safety concerns  Vitals:   04/09/22 0347 04/09/22 0353 04/09/22 0634 04/09/22 0826  BP: 112/72  121/76 119/78  Pulse: (!) 53  (!) 55 64  Resp: 18     Temp: 97.6 F (36.4 C)   99 F (37.2 C)  TempSrc: Oral   Oral  SpO2: 100%   99%  Weight:  72.1 kg    Height:        Intake/Output Summary (Last 24 hours) at 04/09/2022 1529 Last data filed at 04/09/2022 1045 Gross per  24 hour  Intake 1095.58 ml  Output 950 ml  Net 145.58 ml   Filed Weights   04/07/22 1656 04/07/22 2309 04/09/22 0353  Weight: 77.1 kg 70.3 kg 72.1 kg    Data Reviewed: I have personally reviewed and interpreted daily labs, tele strips, imagings as discussed above. I reviewed all nursing notes, pharmacy notes, vitals, pertinent old records I have discussed plan of care as described above with RN and patient/family.  CBC: Recent Labs  Lab 04/07/22 1717  04/08/22 0428 04/09/22 0528  WBC 11.1* 11.2* 8.1  NEUTROABS 7.5  --   --   HGB 14.3 12.4* 11.4*  HCT 43.1 36.0* 33.9*  MCV 87.6 86.7 87.6  PLT 305 248 160   Basic Metabolic Panel: Recent Labs  Lab 04/07/22 1717 04/08/22 0428 04/09/22 0528  NA 141 139 142  K 4.0 3.7 4.1  CL 108 109 108  CO2 26 24 28   GLUCOSE 110* 119* 97  BUN 12 11 7   CREATININE 0.75 0.93 0.84  CALCIUM 9.1 8.2* 8.4*  MG  --   --  2.2  PHOS  --   --  4.1    Studies: No results found.  Scheduled Meds:  heparin  5,000 Units Subcutaneous Q8H   lisinopril  20 mg Oral Daily   morphine  15 mg Oral QHS   morphine  30 mg Oral Daily   nicotine  21 mg Transdermal Daily   pantoprazole  40 mg Oral BID   rosuvastatin  20 mg Oral Daily   sodium chloride flush  3 mL Intravenous Q12H   Continuous Infusions:   PRN Meds: acetaminophen, ALPRAZolam, fentaNYL (SUBLIMAZE) injection, hydrALAZINE, ondansetron (ZOFRAN) IV  Time spent: 35 minutes  Author: Val Riles. MD [facility] 04/09/2022 3:29 PM  To reach On-call, see care teams to locate the attending and reach out to them via [email or web address]. If 7PM-7AM, please contact night-coverage If you still have difficulty reaching the attending provider, please page the [facility] (Director on Call) for [facility] on amion for assistance.

## 2022-04-09 NOTE — TOC CM/SW Note (Signed)
  Transition of Care [facility]) Screening Note   Patient Details  Name: David Harrell Date of Birth: 1960-12-13   Transition of Care [facility]) CM/SW Contact:    Candie Chroman, LCSW Phone Number: 04/09/2022, 3:22 PM    Transition of Care Department [facility]) has reviewed patient and no TOC needs have been identified at this time. We will continue to monitor patient advancement through interdisciplinary progression rounds. If new patient transition needs arise, please place a TOC consult.

## 2022-04-10 DIAGNOSIS — K859 Acute pancreatitis without necrosis or infection, unspecified: Secondary | ICD-10-CM | POA: Diagnosis not present

## 2022-04-10 LAB — BASIC METABOLIC PANEL
Anion gap: 6 (ref 5–15)
BUN: 6 mg/dL (ref 6–20)
CO2: 27 mmol/L (ref 22–32)
Calcium: 8.4 mg/dL — ABNORMAL LOW (ref 8.9–10.3)
Chloride: 107 mmol/L (ref 98–111)
Creatinine, Ser: 0.86 mg/dL (ref 0.61–1.24)
GFR, Estimated: >60 mL/min (ref >60)
Glucose, Bld: 110 mg/dL — ABNORMAL HIGH (ref 70–99)
Potassium: 3.8 mmol/L (ref 3.5–5.1)
Sodium: 140 mmol/L (ref 135–145)

## 2022-04-10 LAB — CBC
HCT: 33.9 % — ABNORMAL LOW (ref 39.0–52.0)
Hemoglobin: 11.4 g/dL — ABNORMAL LOW (ref 13.0–17.0)
MCH: 29.3 pg (ref 26.0–34.0)
MCHC: 33.6 g/dL (ref 30.0–36.0)
MCV: 87.1 fL (ref 80.0–100.0)
Platelets: 206 10*3/uL (ref 150–400)
RBC: 3.89 MIL/uL — ABNORMAL LOW (ref 4.22–5.81)
RDW: 12.6 % (ref 11.5–15.5)
WBC: 7.9 10*3/uL (ref 4.0–10.5)
nRBC: 0 % (ref 0.0–0.2)

## 2022-04-10 LAB — HEPATIC FUNCTION PANEL
ALT: 15 U/L (ref 0–44)
AST: 19 U/L (ref 15–41)
Albumin: 3.5 g/dL (ref 3.5–5.0)
Alkaline Phosphatase: 46 U/L (ref 38–126)
Bilirubin, Direct: <0.1 mg/dL (ref 0.0–0.2)
Total Bilirubin: 0.6 mg/dL (ref 0.3–1.2)
Total Protein: 6.1 g/dL — ABNORMAL LOW (ref 6.5–8.1)

## 2022-04-10 LAB — GLUCOSE, CAPILLARY

## 2022-04-10 LAB — PHOSPHORUS: Phosphorus: 4.7 mg/dL — ABNORMAL HIGH (ref 2.5–4.6)

## 2022-04-10 LAB — MAGNESIUM: Magnesium: 2.3 mg/dL (ref 1.7–2.4)

## 2022-04-10 LAB — LIPASE, BLOOD: Lipase: 33 U/L (ref 11–51)

## 2022-04-10 NOTE — Progress Notes (Addendum)
[facility] Progress Note  [name]    YTK:[id number]  DOA: 04/07/2022     Date of Service: the patient was seen and examined on 04/10/2022  Chief Complaint  Patient presents with   Abdominal Pain    LEFT sided abdominal pain with NV that began 2 weeks ago; No reported fevers, no pain with urinating   Brief hospital course: David Harrell is a 62 y.o. male  with PMHx of hypertension, HLD as reviewed in EMR who presents to the [facility] ED for treatment and evaluation of left lower quadrant abdominal pain with nausea and vomiting x 2 weeks. Pain is constant. No fever or blood in stool. No history of diverticulitis or kidney stone.    Assessment and Plan:  # Acute pancreatitis Patient presented with abdominal pain associated with nausea and vomiting for about 2 weeks.  Pain is constant and getting worse. CT a/p shows inflammation of pancreatic tail, pancreatitis. Lipase within normal range DD ischemic colitis, versus microscopic colitis and PUD (patient takes meloxicam for past 15 years) recommended to avoid NSAIDs Continue IV fluid for hydration Resumed pain medications We will gradually advance diet as per tolerance Started PPI 40 mg p.o. twice daily Leukocytosis, most likely reactive.  Continue to trend 2/2 advance diet to full liquid   Hyperglycemia, mild Monitor and follow.    Hypertension and HLD Resume lisinopril 20 mg pod and Crestor 20 mg pod Prn hydralazine only for systolic goal of 751'Z.  LDL 172 Monitor BP and titrate medications accordingly   Current tobacco use Counseling once patient is stable. Nicotine patch.   Body mass index is 24.27 kg/m.  Interventions:      Diet: CLD DVT Prophylaxis: Subcutaneous Heparin    Advance goals of care discussion: Full code  Family Communication: family was not present at bedside, at the time of interview.  The pt provided permission to discuss medical plan with the family. Opportunity was given to ask  question and all questions were answered satisfactorily.   Disposition:  Pt is from Home, admitted with Abd pain most likely due to pancreatitis, still has significant pain on IV fluid and full liquid diet, which precludes a safe discharge. Discharge to home, when clinically stable, may need few days to improve..  Subjective: No significant events overnight, abdominal pain is 8/10, patient did move bowels, it was soft, no diarrhea or constipation.  Patient was having more pain after full liquid diet, so he stopped eating much.  Does not feel comfortable to advance diet at this time.  Will continue current treatment and gradually advance diet as per tolerance.  Continue full liquid diet today.   Physical Exam: General: NAD, lying comfortably Appear in no distress, affect appropriate Eyes: PERRLA ENT: Oral Mucosa Clear, moist  Neck: no JVD,  Cardiovascular: S1 and S2 Present, no Murmur,  Respiratory: good respiratory effort, Bilateral Air entry equal and Decreased, no Crackles, no wheezes Abdomen: Bowel Sound present, Soft and mild periumbilical and left sided tenderness,  Skin: no rashes Extremities: no Pedal edema, no calf tenderness Neurologic: without any new focal findings Gait not checked due to patient safety concerns  Vitals:   04/09/22 2040 04/10/22 0500 04/10/22 0511 04/10/22 0755  BP: 128/70  107/72 131/80  Pulse: 64  (!) 57 64  Resp: 18  18 17   Temp: 98.1 F (36.7 C)  98.1 F (36.7 C) 97.9 F (36.6 C)  TempSrc:   Oral   SpO2: 95%  96% 96%  Weight:  68.8 kg    Height:        Intake/Output Summary (Last 24 hours) at 04/10/2022 1232 Last data filed at 04/10/2022 4081 Gross per 24 hour  Intake 1262.68 ml  Output --  Net 1262.68 ml   Filed Weights   04/07/22 2309 04/09/22 0353 04/10/22 0500  Weight: 70.3 kg 72.1 kg 68.8 kg    Data Reviewed: I have personally reviewed and interpreted daily labs, tele strips, imagings as discussed above. I reviewed all nursing  notes, pharmacy notes, vitals, pertinent old records I have discussed plan of care as described above with RN and patient/family.  CBC: Recent Labs  Lab 04/07/22 1717 04/08/22 0428 04/09/22 0528 04/10/22 0504  WBC 11.1* 11.2* 8.1 7.9  NEUTROABS 7.5  --   --   --   HGB 14.3 12.4* 11.4* 11.4*  HCT 43.1 36.0* 33.9* 33.9*  MCV 87.6 86.7 87.6 87.1  PLT 305 248 221 448   Basic Metabolic Panel: Recent Labs  Lab 04/07/22 1717 04/08/22 0428 04/09/22 0528 04/10/22 0504  NA 141 139 142 140  K 4.0 3.7 4.1 3.8  CL 108 109 108 107  CO2 26 24 28 27   GLUCOSE 110* 119* 97 110*  BUN 12 11 7 6   CREATININE 0.75 0.93 0.84 0.86  CALCIUM 9.1 8.2* 8.4* 8.4*  MG  --   --  2.2 2.3  PHOS  --   --  4.1 4.7*    Studies: No results found.  Scheduled Meds:  heparin  5,000 Units Subcutaneous Q8H   lisinopril  20 mg Oral Daily   morphine  15 mg Oral QHS   morphine  30 mg Oral Daily   nicotine  21 mg Transdermal Daily   pantoprazole  40 mg Oral BID   rosuvastatin  20 mg Oral Daily   sodium chloride flush  3 mL Intravenous Q12H   Continuous Infusions:  lactated ringers 75 mL/hr at 04/10/22 0516    PRN Meds: acetaminophen, ALPRAZolam, fentaNYL (SUBLIMAZE) injection, hydrALAZINE, ondansetron (ZOFRAN) IV  Time spent: 35 minutes  Author: Val Riles. MD [facility] 04/10/2022 12:32 PM  To reach On-call, see care teams to locate the attending and reach out to them via [email or web address]. If 7PM-7AM, please contact night-coverage If you still have difficulty reaching the attending provider, please page the [facility] (Director on Call) for [facility] on amion for assistance.

## 2022-04-11 DIAGNOSIS — K859 Acute pancreatitis without necrosis or infection, unspecified: Secondary | ICD-10-CM | POA: Diagnosis not present

## 2022-04-11 LAB — MAGNESIUM: Magnesium: 2 mg/dL (ref 1.7–2.4)

## 2022-04-11 LAB — BASIC METABOLIC PANEL
Anion gap: 8 (ref 5–15)
BUN: 7 mg/dL (ref 6–20)
CO2: 28 mmol/L (ref 22–32)
Calcium: 9 mg/dL (ref 8.9–10.3)
Chloride: 105 mmol/L (ref 98–111)
Creatinine, Ser: 0.87 mg/dL (ref 0.61–1.24)
GFR, Estimated: >60 mL/min (ref >60)
Glucose, Bld: 129 mg/dL — ABNORMAL HIGH (ref 70–99)
Potassium: 3.7 mmol/L (ref 3.5–5.1)
Sodium: 141 mmol/L (ref 135–145)

## 2022-04-11 LAB — HEPATIC FUNCTION PANEL
ALT: 19 U/L (ref 0–44)
AST: 22 U/L (ref 15–41)
Albumin: 4 g/dL (ref 3.5–5.0)
Alkaline Phosphatase: 58 U/L (ref 38–126)
Bilirubin, Direct: <0.1 mg/dL (ref 0.0–0.2)
Total Bilirubin: 0.7 mg/dL (ref 0.3–1.2)
Total Protein: 7 g/dL (ref 6.5–8.1)

## 2022-04-11 LAB — CBC
HCT: 39.2 % (ref 39.0–52.0)
Hemoglobin: 13.2 g/dL (ref 13.0–17.0)
MCH: 29.3 pg (ref 26.0–34.0)
MCHC: 33.7 g/dL (ref 30.0–36.0)
MCV: 87.1 fL (ref 80.0–100.0)
Platelets: 234 10*3/uL (ref 150–400)
RBC: 4.5 MIL/uL (ref 4.22–5.81)
RDW: 12.3 % (ref 11.5–15.5)
WBC: 9.3 10*3/uL (ref 4.0–10.5)
nRBC: 0 % (ref 0.0–0.2)

## 2022-04-11 LAB — PHOSPHORUS: Phosphorus: 4.1 mg/dL (ref 2.5–4.6)

## 2022-04-11 LAB — LIPASE, BLOOD: Lipase: 33 U/L (ref 11–51)

## 2022-04-11 NOTE — Progress Notes (Signed)
[facility] Progress Note  [name]    MPN:[id number]  DOA: 04/07/2022     Date of Service: the patient was seen and examined on 04/11/2022  Chief Complaint  Patient presents with   Abdominal Pain    LEFT sided abdominal pain with NV that began 2 weeks ago; No reported fevers, no pain with urinating   Brief hospital course: DAYSEAN TINKHAM is a 62 y.o. male  with PMHx of hypertension, HLD as reviewed in EMR who presents to the [facility] ED for treatment and evaluation of left lower quadrant abdominal pain with nausea and vomiting x 2 weeks. Pain is constant. No fever or blood in stool. No history of diverticulitis or kidney stone.  CT scan of abdomen pelvis showed pancreatitis, so patient was admitted for further management as below.  Assessment and Plan:  # Acute pancreatitis Patient presented with abdominal pain associated with nausea and vomiting for about 2 weeks.  Pain is constant and getting worse. CT a/p shows inflammation of pancreatic tail, pancreatitis. Lipase within normal range DD ischemic colitis, versus microscopic colitis and PUD (patient takes meloxicam for past 15 years) recommended to avoid NSAIDs Continue IV fluid for hydration Resumed pain medications We will gradually advance diet as per tolerance Started PPI 40 mg p.o. twice daily Leukocytosis, most likely reactive.  Continue to trend 2/4 advance diet to soft diet, low fiber   Hyperglycemia, mild Monitor and follow.    Hypertension and HLD Resume lisinopril 20 mg pod and Crestor 20 mg pod Prn hydralazine only for systolic goal of 008'Q.  LDL 172 Monitor BP and titrate medications accordingly   Current tobacco use Counseling once patient is stable. Nicotine patch.   Body mass index is 24.27 kg/m.  Interventions:      Diet: CLD DVT Prophylaxis: Subcutaneous Heparin    Advance goals of care discussion: Full code  Family Communication: family was not present at bedside, at the time of  interview.  The pt provided permission to discuss medical plan with the family. Opportunity was given to ask question and all questions were answered satisfactorily.   Disposition:  Pt is from Home, admitted with Abd pain most likely due to pancreatitis, still has abd pain on IV fluid, diet advanced to soft diet, patient is able to tolerate diet and then possible discharge tomorrow a.m. Discharge to home, possible tomorrow if able to tolerate diet.    Subjective: No significant events overnight, abdominal pain is 5/10, able to tolerate full liquid diet, no nausea vomiting.  Patient did move bowels, no diarrhea or constipation.  Patient is willing to try soft diet today, plan for disposition tomorrow a.m. if stable.    Physical Exam: General: NAD, lying comfortably Appear in no distress, affect appropriate Eyes: PERRLA ENT: Oral Mucosa Clear, moist  Neck: no JVD,  Cardiovascular: S1 and S2 Present, no Murmur,  Respiratory: good respiratory effort, Bilateral Air entry equal and Decreased, no Crackles, no wheezes Abdomen: Bowel Sound present, Soft and mild periumbilical and left sided tenderness,  Skin: no rashes Extremities: no Pedal edema, no calf tenderness Neurologic: without any new focal findings Gait not checked due to patient safety concerns  Vitals:   04/10/22 2018 04/11/22 0446 04/11/22 0500 04/11/22 0834  BP: (!) 143/88 115/71  126/71  Pulse: 65 (!) 56  (!) 58  Resp: 18 16  17   Temp: 98.3 F (36.8 C) 98.4 F (36.9 C)  98.5 F (36.9 C)  TempSrc:  SpO2: 99% 98%  97%  Weight:   71.2 kg   Height:        Intake/Output Summary (Last 24 hours) at 04/11/2022 1143 Last data filed at 04/11/2022 1015 Gross per 24 hour  Intake 3189.61 ml  Output 800 ml  Net 2389.61 ml   Filed Weights   04/09/22 0353 04/10/22 0500 04/11/22 0500  Weight: 72.1 kg 68.8 kg 71.2 kg    Data Reviewed: I have personally reviewed and interpreted daily labs, tele strips, imagings as discussed  above. I reviewed all nursing notes, pharmacy notes, vitals, pertinent old records I have discussed plan of care as described above with RN and patient/family.  CBC: Recent Labs  Lab 04/07/22 1717 04/08/22 0428 04/09/22 0528 04/10/22 0504 04/11/22 0554  WBC 11.1* 11.2* 8.1 7.9 9.3  NEUTROABS 7.5  --   --   --   --   HGB 14.3 12.4* 11.4* 11.4* 13.2  HCT 43.1 36.0* 33.9* 33.9* 39.2  MCV 87.6 86.7 87.6 87.1 87.1  PLT 305 248 221 206 119   Basic Metabolic Panel: Recent Labs  Lab 04/07/22 1717 04/08/22 0428 04/09/22 0528 04/10/22 0504 04/11/22 0554  NA 141 139 142 140 141  K 4.0 3.7 4.1 3.8 3.7  CL 108 109 108 107 105  CO2 26 24 28 27 28   GLUCOSE 110* 119* 97 110* 129*  BUN 12 11 7 6 7   CREATININE 0.75 0.93 0.84 0.86 0.87  CALCIUM 9.1 8.2* 8.4* 8.4* 9.0  MG  --   --  2.2 2.3 2.0  PHOS  --   --  4.1 4.7* 4.1    Studies: No results found.  Scheduled Meds:  heparin  5,000 Units Subcutaneous Q8H   lisinopril  20 mg Oral Daily   morphine  15 mg Oral QHS   morphine  30 mg Oral Daily   nicotine  21 mg Transdermal Daily   pantoprazole  40 mg Oral BID   rosuvastatin  20 mg Oral Daily   sodium chloride flush  3 mL Intravenous Q12H   Continuous Infusions:  lactated ringers 75 mL/hr at 04/11/22 0428    PRN Meds: acetaminophen, ALPRAZolam, fentaNYL (SUBLIMAZE) injection, hydrALAZINE, ondansetron (ZOFRAN) IV  Time spent: 35 minutes  Author: Val Riles. MD [facility] 04/11/2022 11:43 AM  To reach On-call, see care teams to locate the attending and reach out to them via [email or web address]. If 7PM-7AM, please contact night-coverage If you still have difficulty reaching the attending provider, please page the [facility] (Director on Call) for [facility] on amion for assistance.

## 2022-04-12 DIAGNOSIS — K859 Acute pancreatitis without necrosis or infection, unspecified: Secondary | ICD-10-CM | POA: Diagnosis not present

## 2022-04-12 LAB — CBC
HCT: 34.3 % — ABNORMAL LOW (ref 39.0–52.0)
Hemoglobin: 11.6 g/dL — ABNORMAL LOW (ref 13.0–17.0)
MCH: 29.4 pg (ref 26.0–34.0)
MCHC: 33.8 g/dL (ref 30.0–36.0)
MCV: 87.1 fL (ref 80.0–100.0)
Platelets: 210 10*3/uL (ref 150–400)
RBC: 3.94 MIL/uL — ABNORMAL LOW (ref 4.22–5.81)
RDW: 12.5 % (ref 11.5–15.5)
WBC: 8.1 10*3/uL (ref 4.0–10.5)
nRBC: 0 % (ref 0.0–0.2)

## 2022-04-12 LAB — BASIC METABOLIC PANEL
Anion gap: 8 (ref 5–15)
BUN: 12 mg/dL (ref 6–20)
CO2: 28 mmol/L (ref 22–32)
Calcium: 8.8 mg/dL — ABNORMAL LOW (ref 8.9–10.3)
Chloride: 104 mmol/L (ref 98–111)
Creatinine, Ser: 0.85 mg/dL (ref 0.61–1.24)
GFR, Estimated: >60 mL/min (ref >60)
Glucose, Bld: 109 mg/dL — ABNORMAL HIGH (ref 70–99)
Potassium: 3.8 mmol/L (ref 3.5–5.1)
Sodium: 140 mmol/L (ref 135–145)

## 2022-04-12 LAB — MAGNESIUM: Magnesium: 2 mg/dL (ref 1.7–2.4)

## 2022-04-12 LAB — PHOSPHORUS: Phosphorus: 4 mg/dL (ref 2.5–4.6)

## 2022-04-12 NOTE — Discharge Summary (Signed)
[facility] Discharge Summary   [name] VOZ:[id number]  PCP: Baxter Hire, MD  Date of admission: 04/07/2022   Date of discharge:  04/12/2022     Discharge Diagnoses:  Principal Problem:   Pancreatitis Active Problems:   Abdominal pain   Current tobacco use   Hypertension   Hyperglycemia   Admitted From: Home Disposition:  Home   Recommendations for Outpatient Follow-up:  Follow-up with PCP in 1 week, gradually advance diet as per tolerance.  Follow up LABS/TEST:     Diet recommendation: Cardiac diet  Activity: The patient is advised to gradually reintroduce usual activities, as tolerated  Discharge Condition: stable  Code Status: Full code   History of present illness: As per the H and P dictated on admission  Hospital Course:  ELIOTT AMPARAN is a 62 y.o. male  with PMHx of hypertension, HLD as reviewed in EMR who presents to the [facility] ED for treatment and evaluation of left lower quadrant abdominal pain with nausea and vomiting x 2 weeks. Pain is constant. No fever or blood in stool. No history of diverticulitis or kidney stone.  CT scan of abdomen pelvis showed pancreatitis, so patient was admitted for further management as below.   Assessment and Plan: # Acute pancreatitis Patient presented with abdominal pain associated with nausea and vomiting for about 2 weeks.  Pain is constant and getting worse.  CT a/p shows inflammation of pancreatic tail, pancreatitis. Lipase within normal range. S/p IVF, Resumed pain medications. S/p PPI twice daily.  Patient was on clear liquid diet, gradually advance to full liquid and then soft diet.  Patient tolerated well.  Patient had leukocytosis, most likely reactive, trended down to normal range.  Currently patient is stable and agreed with the discharge planning, would like to go home. # Hyperglycemia, mild, recent HbA1c 5.8, slightly elevated, prediabetic range.  Recommended to continue diet control. # Hypertension  and HLD, Resume lisinopril 20 mg pod and Crestor 20 mg pod LDL 172 still elevated.  Follow with PCP as an outpatient. # Current tobacco use, s/p Nicotine patch.  Smoking cessation counseling done. Body mass index is 24.58 kg/m.  Nutrition Interventions:   - Patient was instructed, not to drive, operate heavy machinery, perform activities at heights, swimming or participation in water activities or provide baby sitting services while on Pain, Sleep and Anxiety Medications; until his outpatient Physician has advised to do so again.  - Also recommended to not to take more than prescribed Pain, Sleep and Anxiety Medications.  Patient was ambulatory without any assistance. On the day of the discharge the patient's vitals were stable, and no other acute medical condition were reported by patient. the patient was felt safe to be discharge at Home.  Consultants: None Procedures: None  Discharge Exam: General: Appear in no distress, no Rash; Oral Mucosa Clear, moist. Cardiovascular: S1 and S2 Present, no Murmur, Respiratory: normal respiratory effort, Bilateral Air entry present and no Crackles, no wheezes Abdomen: Bowel Sound present, Soft and no tenderness, no hernia Extremities: no Pedal edema, no calf tenderness Neurology: alert and oriented to time, place, and person affect appropriate.  Filed Weights   04/09/22 0353 04/10/22 0500 04/11/22 0500  Weight: 72.1 kg 68.8 kg 71.2 kg   Vitals:   04/12/22 0459 04/12/22 0819  BP: 111/67 114/75  Pulse: (!) 54 (!) 53  Resp: 18 17  Temp: 98.1 F (36.7 C) 98.2 F (36.8 C)  SpO2: 98% 94%    DISCHARGE MEDICATION:  Allergies as of 04/12/2022       Reactions   Aspirin    Penicillins    Tape    Vicodin [hydrocodone-acetaminophen]         Medication List     TAKE these medications    ALPRAZolam 1 MG tablet Commonly known as: XANAX Take 1 mg by mouth every 12 (twelve) hours as needed for sleep or anxiety.   lisinopril 20 MG  tablet Commonly known as: ZESTRIL Take 20 mg by mouth daily.   morphine 15 MG 12 hr tablet Commonly known as: MS CONTIN Take 15 mg by mouth every evening.   morphine 30 MG 12 hr tablet Commonly known as: MS CONTIN Take 30 mg by mouth daily after breakfast.   Oxycodone HCl 10 MG Tabs Take 1 tablet by mouth every 4 (four) hours as needed.   rosuvastatin 20 MG tablet Commonly known as: CRESTOR Take 20 mg by mouth daily.       Allergies  Allergen Reactions   Aspirin    Penicillins    Tape    Vicodin [Hydrocodone-Acetaminophen]    Discharge Instructions     Call MD for:  difficulty breathing, headache or visual disturbances   Complete by: As directed    Call MD for:  extreme fatigue   Complete by: As directed    Call MD for:  persistant dizziness or light-headedness   Complete by: As directed    Call MD for:  persistant nausea and vomiting   Complete by: As directed    Call MD for:  severe uncontrolled pain   Complete by: As directed    Call MD for:  temperature >100.4   Complete by: As directed    Diet - low sodium heart healthy   Complete by: As directed    Discharge instructions   Complete by: As directed    Follow-up with PCP in 1 week, gradually advance diet as per tolerance.   Increase activity slowly   Complete by: As directed        The results of significant diagnostics from this hospitalization (including imaging, microbiology, ancillary and laboratory) are listed below for reference.    Significant Diagnostic Studies: US Abdomen Limited RUQ (LIVER/GB)  Result Date: 04/07/2022 CLINICAL DATA:  Acute pancreatitis EXAM: ULTRASOUND ABDOMEN LIMITED RIGHT UPPER QUADRANT COMPARISON:  CT abdomen and pelvis 04/07/2022 FINDINGS: Gallbladder: No gallstones or wall thickening visualized. No sonographic Murphy sign noted by sonographer. Hyperechoic focus with comet tail artifact in the gallbladder fundus compatible with benign adenomyomatosis. Common bile duct:  Diameter: 3 mm Liver: Hepatic cyst in the peripheral left lobe of the liver as seen on CT earlier today. No follow-up recommended. Within normal limits in parenchymal echogenicity. Portal vein is patent on color Doppler imaging with normal direction of blood flow towards the liver. Other: None. IMPRESSION: Unremarkable right upper quadrant ultrasound Electronically Signed   By: Minerva Fester M.D.   On: 04/07/2022 21:03   CT ABDOMEN PELVIS W CONTRAST  Result Date: 04/07/2022 CLINICAL DATA:  Left lower quadrant abdominal pain, nausea/vomiting EXAM: CT ABDOMEN AND PELVIS WITH CONTRAST TECHNIQUE: Multidetector CT imaging of the abdomen and pelvis was performed using the standard protocol following bolus administration of intravenous contrast. RADIATION DOSE REDUCTION: This exam was performed according to the departmental dose-optimization program which includes automated exposure control, adjustment of the mA and/or kV according to patient size and/or use of iterative reconstruction technique. CONTRAST:  OMNIPAQUE IOHEXOL 300 MG/ML  SOLN COMPARISON:  11/27/2018  FINDINGS: Lower chest: Lung bases are clear. Hepatobiliary: Subcentimeter cyst in segment 4 (series 2/image 8), benign. Gallbladder is unremarkable. No intrahepatic or extrahepatic ductal dilatation. Pancreas: Peripancreatic inflammatory changes along the mid pancreatic tail (series 2/image 16), suggesting acute pancreatitis. No drainable fluid collection/walled-off necrosis. Spleen: Within normal limits. Adrenals/Urinary Tract: Adrenal glands are within normal limits. 16 mm simple medial left upper pole renal cyst (series 2/image 12), benign (Bosniak I). No follow-up is recommended. Right kidney is within normal limits. No hydronephrosis. Bladder is mildly thick-walled although underdistended. Stomach/Bowel: Stomach is within normal limits. No evidence of bowel obstruction. Normal appendix (series 2/image 44). No colonic wall thickening or  inflammatory changes. Vascular/Lymphatic: No evidence of abdominal aortic aneurysm. Atherosclerotic calcifications of the abdominal aorta and branch vessels. No suspicious abdominopelvic lymphadenopathy. Reproductive: Prostatomegaly, with enlargement of the central gland indenting the base of the bladder, reflecting BPH. Other: No abdominopelvic ascites. Musculoskeletal: Status post PLIF at L5-S1. IMPRESSION: Acute pancreatitis. No drainable fluid collection/walled-off necrosis. Additional ancillary findings as above. Electronically Signed   By: Julian Hy M.D.   On: 04/07/2022 19:22    Microbiology: Recent Results (from the past 240 hour(s))  Resp panel by RT-PCR (RSV, Flu A&B, Covid) Anterior Nasal Swab     Status: None   Collection Time: 04/07/22  5:17 PM   Specimen: Anterior Nasal Swab  Result Value Ref Range Status   SARS Coronavirus 2 by RT PCR NEGATIVE NEGATIVE Final    Comment: (NOTE) SARS-CoV-2 target nucleic acids are NOT DETECTED.  The SARS-CoV-2 RNA is generally detectable in upper respiratory specimens during the acute phase of infection. The lowest concentration of SARS-CoV-2 viral copies this assay can detect is 138 copies/mL. A negative result does not preclude SARS-Cov-2 infection and should not be used as the sole basis for treatment or other patient management decisions. A negative result may occur with  improper specimen collection/handling, submission of specimen other than nasopharyngeal swab, presence of viral mutation(s) within the areas targeted by this assay, and inadequate number of viral copies(<138 copies/mL). A negative result must be combined with clinical observations, patient history, and epidemiological information. The expected result is Negative.  Fact Sheet for Patients:  EntrepreneurPulse.com.au  Fact Sheet for Healthcare Providers:  IncredibleEmployment.be  This test is no t yet approved or cleared by the  [place] FDA and  has been authorized for detection and/or diagnosis of SARS-CoV-2 by FDA under an Emergency Use Authorization (EUA). This EUA will remain  in effect (meaning this test can be used) for the duration of the COVID-19 declaration under Section 564(b)(1) of the Act, 21 U.S.C.section 360bbb-3(b)(1), unless the authorization is terminated  or revoked sooner.       Influenza A by PCR NEGATIVE NEGATIVE Final   Influenza B by PCR NEGATIVE NEGATIVE Final    Comment: (NOTE) The Xpert Xpress SARS-CoV-2/FLU/RSV plus assay is intended as an aid in the diagnosis of influenza from Nasopharyngeal swab specimens and should not be used as a sole basis for treatment. Nasal washings and aspirates are unacceptable for Xpert Xpress SARS-CoV-2/FLU/RSV testing.  Fact Sheet for Patients: EntrepreneurPulse.com.au  Fact Sheet for Healthcare Providers: IncredibleEmployment.be  This test is not yet approved or cleared by the [place] FDA and has been authorized for detection and/or diagnosis of SARS-CoV-2 by FDA under an Emergency Use Authorization (EUA). This EUA will remain in effect (meaning this test can be used) for the duration of the COVID-19 declaration under Section 564(b)(1) of the Act, 21 U.S.C. section 360bbb-3(b)(1),  unless the authorization is terminated or revoked.     Resp Syncytial Virus by PCR NEGATIVE NEGATIVE Final    Comment: (NOTE) Fact Sheet for Patients: EntrepreneurPulse.com.au  Fact Sheet for Healthcare Providers: IncredibleEmployment.be  This test is not yet approved or cleared by the [place] FDA and has been authorized for detection and/or diagnosis of SARS-CoV-2 by FDA under an Emergency Use Authorization (EUA). This EUA will remain in effect (meaning this test can be used) for the duration of the COVID-19 declaration under Section 564(b)(1) of the Act, 21 U.S.C. section  360bbb-3(b)(1), unless the authorization is terminated or revoked.  Performed at [facility], [address] [place], [place] [address]      Labs: CBC: Recent Labs  Lab 04/07/22 1717 04/08/22 0428 04/09/22 0528 04/10/22 0504 04/11/22 0554 04/12/22 0436  WBC 11.1* 11.2* 8.1 7.9 9.3 8.1  NEUTROABS 7.5  --   --   --   --   --   HGB 14.3 12.4* 11.4* 11.4* 13.2 11.6*  HCT 43.1 36.0* 33.9* 33.9* 39.2 34.3*  MCV 87.6 86.7 87.6 87.1 87.1 87.1  PLT 305 248 221 206 234 009   Basic Metabolic Panel: Recent Labs  Lab 04/08/22 0428 04/09/22 0528 04/10/22 0504 04/11/22 0554 04/12/22 0436  NA 139 142 140 141 140  K 3.7 4.1 3.8 3.7 3.8  CL 109 108 107 105 104  CO2 24 28 27 28 28   GLUCOSE 119* 97 110* 129* 109*  BUN 11 7 6 7 12   CREATININE 0.93 0.84 0.86 0.87 0.85  CALCIUM 8.2* 8.4* 8.4* 9.0 8.8*  MG  --  2.2 2.3 2.0 2.0  PHOS  --  4.1 4.7* 4.1 4.0   Liver Function Tests: Recent Labs  Lab 04/07/22 1717 04/08/22 0428 04/09/22 0528 04/10/22 0504 04/11/22 0554  AST 17 18 21 19 22   ALT 16 15 15 15 19   ALKPHOS 60 51 48 46 58  BILITOT 0.5 0.6 0.7 0.6 0.7  PROT 7.8 6.2* 6.0* 6.1* 7.0  ALBUMIN 4.3 3.6 3.5 3.5 4.0   Recent Labs  Lab 04/07/22 1717 04/09/22 0528 04/10/22 0504 04/11/22 0554  LIPASE 39 39 33 33   No results for input(s): "AMMONIA" in the last 168 hours. Cardiac Enzymes: No results for input(s): "CKTOTAL", "CKMB", "CKMBINDEX", "TROPONINI" in the last 168 hours. BNP (last 3 results) No results for input(s): "BNP" in the last 8760 hours. CBG: Recent Labs  Lab 04/10/22 1225  GLUCAP PATIENT IDENTIFICATION ERROR. PLEASE DISREGARD RESULTS. ACCOUNT WILL BE CREDITED.    Time spent: 35 minutes  Signed:  Val Riles  [facility] 04/12/2022 11:08 AM

## 2022-04-29 DIAGNOSIS — Z09 Encounter for follow-up examination after completed treatment for conditions other than malignant neoplasm: Secondary | ICD-10-CM | POA: Diagnosis not present

## 2022-04-29 DIAGNOSIS — K859 Acute pancreatitis without necrosis or infection, unspecified: Secondary | ICD-10-CM | POA: Diagnosis not present

## 2022-07-02 ENCOUNTER — Inpatient Hospital Stay: Payer: Medicare HMO

## 2022-07-02 ENCOUNTER — Emergency Department: Payer: Medicare HMO

## 2022-07-02 ENCOUNTER — Other Ambulatory Visit: Payer: Self-pay

## 2022-07-02 ENCOUNTER — Inpatient Hospital Stay
Admission: EM | Admit: 2022-07-02 | Discharge: 2022-07-07 | DRG: 439 | Disposition: A | Discharge status: Home / Self Care | Payer: Medicare HMO | Attending: Osteopathic Medicine | Admitting: Osteopathic Medicine

## 2022-07-02 DIAGNOSIS — M1990 Unspecified osteoarthritis, unspecified site: Secondary | ICD-10-CM | POA: Diagnosis present

## 2022-07-02 DIAGNOSIS — R7303 Prediabetes: Secondary | ICD-10-CM | POA: Diagnosis present

## 2022-07-02 DIAGNOSIS — I10 Essential (primary) hypertension: Secondary | ICD-10-CM | POA: Diagnosis not present

## 2022-07-02 DIAGNOSIS — F1120 Opioid dependence, uncomplicated: Secondary | ICD-10-CM | POA: Diagnosis not present

## 2022-07-02 DIAGNOSIS — K859 Acute pancreatitis without necrosis or infection, unspecified: Principal | ICD-10-CM | POA: Diagnosis present

## 2022-07-02 DIAGNOSIS — G8929 Other chronic pain: Secondary | ICD-10-CM | POA: Diagnosis present

## 2022-07-02 DIAGNOSIS — Z880 Allergy status to penicillin: Secondary | ICD-10-CM

## 2022-07-02 DIAGNOSIS — K31A14 Gastric intestinal metaplasia without dysplasia, involving the cardia: Secondary | ICD-10-CM | POA: Diagnosis not present

## 2022-07-02 DIAGNOSIS — R1032 Left lower quadrant pain: Secondary | ICD-10-CM | POA: Diagnosis not present

## 2022-07-02 DIAGNOSIS — Z79899 Other long term (current) drug therapy: Secondary | ICD-10-CM

## 2022-07-02 DIAGNOSIS — R634 Abnormal weight loss: Secondary | ICD-10-CM | POA: Diagnosis not present

## 2022-07-02 DIAGNOSIS — R739 Hyperglycemia, unspecified: Secondary | ICD-10-CM | POA: Diagnosis not present

## 2022-07-02 DIAGNOSIS — R109 Unspecified abdominal pain: Secondary | ICD-10-CM | POA: Diagnosis not present

## 2022-07-02 DIAGNOSIS — C259 Malignant neoplasm of pancreas, unspecified: Secondary | ICD-10-CM | POA: Diagnosis not present

## 2022-07-02 DIAGNOSIS — Z720 Tobacco use: Secondary | ICD-10-CM | POA: Diagnosis present

## 2022-07-02 DIAGNOSIS — D630 Anemia in neoplastic disease: Secondary | ICD-10-CM | POA: Diagnosis not present

## 2022-07-02 DIAGNOSIS — I82891 Chronic embolism and thrombosis of other specified veins: Secondary | ICD-10-CM | POA: Diagnosis not present

## 2022-07-02 DIAGNOSIS — K2289 Other specified disease of esophagus: Secondary | ICD-10-CM | POA: Diagnosis present

## 2022-07-02 DIAGNOSIS — K861 Other chronic pancreatitis: Secondary | ICD-10-CM | POA: Diagnosis present

## 2022-07-02 DIAGNOSIS — K8689 Other specified diseases of pancreas: Secondary | ICD-10-CM | POA: Diagnosis not present

## 2022-07-02 DIAGNOSIS — F17210 Nicotine dependence, cigarettes, uncomplicated: Secondary | ICD-10-CM | POA: Diagnosis present

## 2022-07-02 DIAGNOSIS — Z6821 Body mass index (BMI) 21.0-21.9, adult: Secondary | ICD-10-CM | POA: Diagnosis not present

## 2022-07-02 DIAGNOSIS — Z91048 Other nonmedicinal substance allergy status: Secondary | ICD-10-CM

## 2022-07-02 DIAGNOSIS — Z886 Allergy status to analgesic agent status: Secondary | ICD-10-CM

## 2022-07-02 DIAGNOSIS — K2270 Barrett's esophagus without dysplasia: Secondary | ICD-10-CM | POA: Diagnosis not present

## 2022-07-02 DIAGNOSIS — Z885 Allergy status to narcotic agent status: Secondary | ICD-10-CM

## 2022-07-02 LAB — URINALYSIS, ROUTINE W REFLEX MICROSCOPIC
Bacteria, UA: NONE SEEN
Bilirubin Urine: NEGATIVE
Glucose, UA: NEGATIVE mg/dL
Ketones, ur: NEGATIVE mg/dL
Leukocytes,Ua: NEGATIVE
Nitrite: NEGATIVE
Protein, ur: 30 mg/dL — AB
Specific Gravity, Urine: 1.018 (ref 1.005–1.030)
Squamous Epithelial / HPF: NONE SEEN /HPF (ref 0–5)
pH: 5 (ref 5.0–8.0)

## 2022-07-02 LAB — CBC
HCT: 46 % (ref 39.0–52.0)
Hemoglobin: 15.2 g/dL (ref 13.0–17.0)
MCH: 29.1 pg (ref 26.0–34.0)
MCHC: 33 g/dL (ref 30.0–36.0)
MCV: 88.1 fL (ref 80.0–100.0)
Platelets: 298 10*3/uL (ref 150–400)
RBC: 5.22 MIL/uL (ref 4.22–5.81)
RDW: 13.2 % (ref 11.5–15.5)
WBC: 11.6 10*3/uL — ABNORMAL HIGH (ref 4.0–10.5)
nRBC: 0 % (ref 0.0–0.2)

## 2022-07-02 LAB — COMPREHENSIVE METABOLIC PANEL
ALT: 19 U/L (ref 0–44)
AST: 23 U/L (ref 15–41)
Albumin: 4.6 g/dL (ref 3.5–5.0)
Alkaline Phosphatase: 61 U/L (ref 38–126)
Anion gap: 11 (ref 5–15)
BUN: 9 mg/dL (ref 6–20)
CO2: 25 mmol/L (ref 22–32)
Calcium: 9.6 mg/dL (ref 8.9–10.3)
Chloride: 103 mmol/L (ref 98–111)
Creatinine, Ser: 0.87 mg/dL (ref 0.61–1.24)
GFR, Estimated: >60 mL/min (ref >60)
Glucose, Bld: 148 mg/dL — ABNORMAL HIGH (ref 70–99)
Potassium: 4.4 mmol/L (ref 3.5–5.1)
Sodium: 139 mmol/L (ref 135–145)
Total Bilirubin: 0.6 mg/dL (ref 0.3–1.2)
Total Protein: 8.3 g/dL — ABNORMAL HIGH (ref 6.5–8.1)

## 2022-07-02 LAB — LIPASE, BLOOD: Lipase: 26 U/L (ref 11–51)

## 2022-07-02 MED ORDER — MORPHINE SULFATE (PF) 4 MG/ML IV SOLN
4.0000 mg | Freq: Once | INTRAVENOUS | Status: AC
  Administered 2022-07-02: 4 mg via INTRAVENOUS
  Filled 2022-07-02: qty 1

## 2022-07-02 MED ORDER — LACTATED RINGERS IV SOLN
INTRAVENOUS | Status: AC
  Administered 2022-07-02: 100 mL/h via INTRAVENOUS

## 2022-07-02 MED ORDER — GADOBUTROL 1 MMOL/ML IV SOLN
7.0000 mL | Freq: Once | INTRAVENOUS | Status: AC | PRN
  Administered 2022-07-02: 7 mL via INTRAVENOUS

## 2022-07-02 MED ORDER — ONDANSETRON HCL 4 MG PO TABS
4.0000 mg | ORAL_TABLET | Freq: Four times a day (QID) | ORAL | Status: DC | PRN
  Administered 2022-07-06: 4 mg via ORAL
  Filled 2022-07-02: qty 1

## 2022-07-02 MED ORDER — DIAZEPAM 5 MG/ML IJ SOLN
2.5000 mg | Freq: Once | INTRAMUSCULAR | Status: AC
  Administered 2022-07-02: 2.5 mg via INTRAVENOUS
  Filled 2022-07-02: qty 2

## 2022-07-02 MED ORDER — PANTOPRAZOLE SODIUM 40 MG IV SOLR
40.0000 mg | Freq: Two times a day (BID) | INTRAVENOUS | Status: DC
  Administered 2022-07-02 – 2022-07-06 (×8): 40 mg via INTRAVENOUS
  Filled 2022-07-02 (×8): qty 10

## 2022-07-02 MED ORDER — SODIUM CHLORIDE 0.9% FLUSH
3.0000 mL | Freq: Two times a day (BID) | INTRAVENOUS | Status: DC
  Administered 2022-07-02 – 2022-07-07 (×10): 3 mL via INTRAVENOUS

## 2022-07-02 MED ORDER — NICOTINE 21 MG/24HR TD PT24
21.0000 mg | MEDICATED_PATCH | Freq: Every day | TRANSDERMAL | Status: DC
  Administered 2022-07-03 – 2022-07-07 (×5): 21 mg via TRANSDERMAL
  Filled 2022-07-02 (×5): qty 1

## 2022-07-02 MED ORDER — HEPARIN SODIUM (PORCINE) 5000 UNIT/ML IJ SOLN
5000.0000 [IU] | Freq: Three times a day (TID) | INTRAMUSCULAR | Status: DC
  Administered 2022-07-02 – 2022-07-07 (×13): 5000 [IU] via SUBCUTANEOUS
  Filled 2022-07-02 (×13): qty 1

## 2022-07-02 MED ORDER — ONDANSETRON HCL 4 MG/2ML IJ SOLN
4.0000 mg | Freq: Once | INTRAMUSCULAR | Status: AC
  Administered 2022-07-02: 4 mg via INTRAVENOUS
  Filled 2022-07-02: qty 2

## 2022-07-02 MED ORDER — ONDANSETRON HCL 4 MG/2ML IJ SOLN
4.0000 mg | Freq: Four times a day (QID) | INTRAMUSCULAR | Status: DC | PRN
  Administered 2022-07-03: 4 mg via INTRAVENOUS
  Filled 2022-07-02: qty 2

## 2022-07-02 MED ORDER — ROSUVASTATIN CALCIUM 10 MG PO TABS
20.0000 mg | ORAL_TABLET | Freq: Every day | ORAL | Status: DC
  Administered 2022-07-03 – 2022-07-07 (×4): 20 mg via ORAL
  Filled 2022-07-02 (×5): qty 2

## 2022-07-02 MED ORDER — ALPRAZOLAM 0.5 MG PO TABS
1.0000 mg | ORAL_TABLET | Freq: Two times a day (BID) | ORAL | Status: DC | PRN
  Administered 2022-07-03 – 2022-07-07 (×6): 1 mg via ORAL
  Filled 2022-07-02 (×6): qty 2

## 2022-07-02 MED ORDER — IOHEXOL 300 MG/ML  SOLN
100.0000 mL | Freq: Once | INTRAMUSCULAR | Status: AC | PRN
  Administered 2022-07-02: 100 mL via INTRAVENOUS

## 2022-07-02 MED ORDER — MORPHINE SULFATE (PF) 2 MG/ML IV SOLN
2.0000 mg | INTRAVENOUS | Status: DC | PRN
  Administered 2022-07-02 – 2022-07-07 (×20): 2 mg via INTRAVENOUS
  Filled 2022-07-02 (×20): qty 1

## 2022-07-02 MED ORDER — SODIUM CHLORIDE 0.9 % IV BOLUS
1000.0000 mL | Freq: Once | INTRAVENOUS | Status: AC
  Administered 2022-07-02: 1000 mL via INTRAVENOUS

## 2022-07-02 NOTE — Assessment & Plan Note (Signed)
Counseled and nicotine patch.

## 2022-07-02 NOTE — Assessment & Plan Note (Addendum)
Pt admitted with pancreatitis. MRI ordered for cyst eval.  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  D/W GI Dr. [name] and nothing to do as it is chronic.  No bleeding or anemia.  IV PPI. MIVF.

## 2022-07-02 NOTE — Assessment & Plan Note (Signed)
Will start pt on amlodipine.  If additional BP control will add BB. PRN hydralazine.

## 2022-07-02 NOTE — Assessment & Plan Note (Signed)
2/2 acute pancreatitis. Pt has normal lipase, came with abdominal pain ct imaging showing pancreatitis.  PRN pain control with morphine. MIVF.  NPO except for sips with meds.

## 2022-07-02 NOTE — Assessment & Plan Note (Signed)
A1c ? Prediabetes or diabetes.

## 2022-07-02 NOTE — ED Provider Notes (Signed)
[facility] Provider Note    Event Date/Time   First MD Initiated Contact with Patient 07/02/22 1623     (approximate)   History   Abdominal Pain   HPI  David Harrell is a 62 y.o. male with a history of pancreatitis who presents with complaints of left-sided abdominal pain.  He reports this is consistent with prior episodes of pancreatitis.  He reports he was admitted at the end of January of this year for similar pain.  Review of records demonstrates that the patient was admitted on January 31, CT scan demonstrated acute pancreatitis despite normal lipase at that time.     Physical Exam   Triage Vital Signs: ED Triage Vitals [07/02/22 1500]  Enc Vitals Group     BP (!) 145/103     Pulse Rate 98     Resp 17     Temp 98.5 F (36.9 C)     Temp Source Oral     SpO2 96 %     Weight 72.6 kg (160 lb)     Height      Head Circumference      Peak Flow      Pain Score 8     Pain Loc      Pain Edu?      Excl. in GC?     Most recent vital signs: Vitals:   07/02/22 1500 07/02/22 1708  BP: (!) 145/103 116/77  Pulse: 98 70  Resp: 17 16  Temp: 98.5 F (36.9 C)   SpO2: 96% 97%     General: Awake, no distress.  CV:  Good peripheral perfusion.  Resp:  Normal effort.  Abd:  No distention.  Tenderness in the left abdomen Other:     ED Results / Procedures / Treatments   Labs (all labs ordered are listed, but only abnormal results are displayed) Labs Reviewed  COMPREHENSIVE METABOLIC PANEL - Abnormal; Notable for the following components:      Result Value   Glucose, Bld 148 (*)    Total Protein 8.3 (*)    All other components within normal limits  CBC - Abnormal; Notable for the following components:   WBC 11.6 (*)    All other components within normal limits  URINALYSIS, ROUTINE W REFLEX MICROSCOPIC - Abnormal; Notable for the following components:   Color, Urine YELLOW (*)    APPearance HAZY (*)    Hgb urine dipstick LARGE (*)     Protein, ur 30 (*)    All other components within normal limits  LIPASE, BLOOD     EKG     RADIOLOGY Ct abd/pelv    PROCEDURES:  Critical Care performed:   Procedures   MEDICATIONS ORDERED IN ED: Medications  morphine (PF) 4 MG/ML injection 4 mg (4 mg Intravenous Given 07/02/22 1711)  ondansetron (ZOFRAN) injection 4 mg (4 mg Intravenous Given 07/02/22 1710)  sodium chloride 0.9 % bolus 1,000 mL (0 mLs Intravenous Stopped 07/02/22 1814)  iohexol (OMNIPAQUE) 300 MG/ML solution 100 mL (100 mLs Intravenous Contrast Given 07/02/22 1726)     IMPRESSION / MDM / ASSESSMENT AND PLAN / ED COURSE  I reviewed the triage vital signs and the nursing notes. Patient's presentation is most consistent with acute presentation with potential threat to life or bodily function.  Patient presents with abdominal pain as detailed above, given his history suspicious for pancreatitis.  Differential also includes ureterolithiasis, UTI, less likely small bowel obstruction  Will treat with IV  morphine, IV Zofran, obtain CT abdomen pelvis.  Lab work notable for some blood in the urine  Pending CT  CT scan is consistent with pancreatitis, possible pseudocyst noted  Patient feeling somewhat improved after morphine, still having pain we will discuss with the hospitalist for admission      FINAL CLINICAL IMPRESSION(S) / ED DIAGNOSES   Final diagnoses:  Acute pancreatitis, unspecified complication status, unspecified pancreatitis type     Rx / DC Orders   ED Discharge Orders     None        Note:  This document was prepared using Dragon voice recognition software and may include unintentional dictation errors.   Jene Every, MD 07/02/22 [phone]

## 2022-07-02 NOTE — H&P (Signed)
History and Physical     Patient: EMAN MORIMOTO DDU:[id number] DOB: 04-02-1960 DOA: 07/02/2022 DOS: the patient was seen and examined on 07/02/2022 PCP: Gracelyn Nurse, MD   Patient coming from: Home  Chief Complaint: Abdominal pain.  HISTORY OF PRESENT ILLNESS: YERACHMIEL SPINNEY is an 62 y.o. male seen in ed for abd pain and nausea. Pt states he has not drank in over 20 years. And the only medicine change he recalls is in [place] when got started in [place] and hsi first episode of pancreatitis was. Pt reports that he has bene told of seizures but does not have any seizure meds and has not had any seizures,. Pt has hardware in his neck and does not know if it is MRI compatible. D/W him results of pancreatic cyst nad need for MRI.   Past Medical History:  Diagnosis Date   Arthritis    Hiatal hernia    Hypertension    Review of Systems  Gastrointestinal:  Positive for abdominal pain and nausea.  All other systems reviewed and are negative.  Allergies  Allergen Reactions   Aspirin    Penicillins    Tape    Vicodin [Hydrocodone-Acetaminophen]    Past Surgical History:  Procedure Laterality Date   JOINT REPLACEMENT     MEDICATIONS: Prior to Admission medications   Medication Sig Start Date End Date Taking? Authorizing Provider  ALPRAZolam Prudy Feeler) 1 MG tablet Take 1 mg by mouth every 12 (twelve) hours as needed for sleep or anxiety.    [provider]  lisinopril (ZESTRIL) 20 MG tablet Take 20 mg by mouth daily.    [provider]  morphine (MS CONTIN) 15 MG 12 hr tablet Take 15 mg by mouth every evening. 03/29/22   [provider]  morphine (MS CONTIN) 30 MG 12 hr tablet Take 30 mg by mouth daily after breakfast. 03/29/22   [provider]  Oxycodone HCl 10 MG TABS Take 1 tablet by mouth every 4 (four) hours as needed. 03/10/22   [provider]  rosuvastatin (CRESTOR) 20 MG tablet Take 20 mg by mouth daily.    [provider]   ED  Course: Pt in Ed alert awake oriented afebrile O2 sats of 97% on room air Vitals:   07/02/22 1500 07/02/22 1708  BP: (!) 145/103 116/77  Pulse: 98 70  Resp: 17 16  Temp: 98.5 F (36.9 C)   TempSrc: Oral   SpO2: 96% 97%  Weight: 72.6 kg    No intake/output data recorded. SpO2: 97 % Blood work in ed shows: CMP is normal electrolytes LFTs kidney function glucose 148. CBC shows leukocytosis of 11.6 normal hemoglobin and platelet count. Urinalysis shows hemoglobin 6-10 RBCs  in the urine protein 0-5 WBC hazy urine yellow color negative for nitrite and leukocyte esterase. CT of the abdomen is abnormal with pancreatitis and cyst see full report below.  Results for orders placed or performed during the hospital encounter of 07/02/22 (from the past 72 hour(s))  Urinalysis, Routine w reflex microscopic -Urine, Clean Catch     Status: Abnormal   Collection Time: 07/02/22  3:01 PM  Result Value Ref Range   Color, Urine YELLOW (A) YELLOW   APPearance HAZY (A) CLEAR   Specific Gravity, Urine 1.018 1.005 - 1.030   pH 5.0 5.0 - 8.0   Glucose, UA NEGATIVE NEGATIVE mg/dL   Hgb urine dipstick LARGE (A) NEGATIVE   Bilirubin Urine NEGATIVE NEGATIVE   Ketones, ur NEGATIVE NEGATIVE  mg/dL   Protein, ur 30 (A) NEGATIVE mg/dL   Nitrite NEGATIVE NEGATIVE   Leukocytes,Ua NEGATIVE NEGATIVE   RBC / HPF 6-10 0 - 5 RBC/hpf   WBC, UA 0-5 0 - 5 WBC/hpf   Bacteria, UA NONE SEEN NONE SEEN   Squamous Epithelial / HPF NONE SEEN 0 - 5 /HPF   Mucus PRESENT    Hyaline Casts, UA PRESENT     Comment: Performed at [facility], [address] [place], [place] [address]  Lipase, blood     Status: None   Collection Time: 07/02/22  3:03 PM  Result Value Ref Range   Lipase 26 11 - 51 U/L    Comment: Performed at [facility], [address] [place], [place] [address]  Comprehensive metabolic panel     Status: Abnormal   Collection Time: 07/02/22  3:03 PM  Result Value Ref Range   Sodium 139  135 - 145 mmol/L   Potassium 4.4 3.5 - 5.1 mmol/L   Chloride 103 98 - 111 mmol/L   CO2 25 22 - 32 mmol/L   Glucose, Bld 148 (H) 70 - 99 mg/dL    Comment: Glucose reference range applies only to samples taken after fasting for at least 8 hours.   BUN 9 6 - 20 mg/dL   Creatinine, Ser 0.98 0.61 - 1.24 mg/dL   Calcium 9.6 8.9 - 11.9 mg/dL   Total Protein 8.3 (H) 6.5 - 8.1 g/dL   Albumin 4.6 3.5 - 5.0 g/dL   AST 23 15 - 41 U/L   ALT 19 0 - 44 U/L   Alkaline Phosphatase 61 38 - 126 U/L   Total Bilirubin 0.6 0.3 - 1.2 mg/dL   GFR, Estimated >14 >78 mL/min    Comment: (NOTE) Calculated using the CKD-EPI Creatinine Equation (2021)    Anion gap 11 5 - 15    Comment: Performed at [facility], [address] [place], [place] [address]  CBC     Status: Abnormal   Collection Time: 07/02/22  3:03 PM  Result Value Ref Range   WBC 11.6 (H) 4.0 - 10.5 K/uL   RBC 5.22 4.22 - 5.81 MIL/uL   Hemoglobin 15.2 13.0 - 17.0 g/dL   HCT 13.0 86.5 - 78.4 %   MCV 88.1 80.0 - 100.0 fL   MCH 29.1 26.0 - 34.0 pg   MCHC 33.0 30.0 - 36.0 g/dL   RDW 69.6 29.5 - 28.4 %   Platelets 298 150 - 400 K/uL   nRBC 0.0 0.0 - 0.2 %    Comment: Performed at [facility], [address] [place], [place] [address]    Lab Results  Component Value Date   CREATININE 0.87 07/02/2022   CREATININE 0.85 04/12/2022   CREATININE 0.87 04/11/2022      Latest Ref Rng & Units 07/02/2022    3:03 PM 04/12/2022    4:36 AM 04/11/2022    5:54 AM  CMP  Glucose 70 - 99 mg/dL 010  272  536   BUN 6 - 20 mg/dL 9  12  7    Creatinine 0.61 - 1.24 mg/dL 6.44  0.34  7.42   Sodium 135 - 145 mmol/L 139  140  141   Potassium 3.5 - 5.1 mmol/L 4.4  3.8  3.7   Chloride 98 - 111 mmol/L 103  104  105   CO2 22 - 32 mmol/L 25  28  28    Calcium 8.9 - 10.3 mg/dL  9.6  8.8  9.0   Total Protein 6.5 - 8.1 g/dL 8.3   7.0   Total Bilirubin 0.3 - 1.2 mg/dL 0.6   0.7   Alkaline Phos 38 - 126 U/L 61   58   AST 15 - 41 U/L 23   22   ALT 0  - 44 U/L 19   19    Unresulted Labs (From admission, onward)     Start     Ordered   07/03/22 0500  Comprehensive metabolic panel  Tomorrow morning,   STAT        07/02/22 1850   07/03/22 0500  CBC  Tomorrow morning,   STAT        07/02/22 1850   07/02/22 1903  CA 19-9 (SERIAL)  Add-on,   AD        07/02/22 1902   07/02/22 1838  Hemoglobin A1c  Add-on,   AD        07/02/22 1837           Pt has received : Orders Placed This Encounter  Procedures   CT ABDOMEN PELVIS W CONTRAST    Standing Status:   Standing    Number of Occurrences:   1    Order Specific Question:   Does the patient have a contrast media/X-ray dye allergy?    Answer:   No    Order Specific Question:   If indicated for the ordered procedure, I authorize the administration of contrast media per Radiology protocol    Answer:   Yes    Order Specific Question:   If indicated for the ordered procedure, I authorize the administration of oral contrast media per Radiology protocol    Answer:   Yes   MR ABDOMEN W WO CONTRAST    Standing Status:   Standing    Number of Occurrences:   1    Order Specific Question:   If indicated for the ordered procedure, I authorize the administration of contrast media per Radiology protocol    Answer:   Yes    Order Specific Question:   What is the patient's sedation requirement?    Answer:   No Sedation    Order Specific Question:   Does the patient have a pacemaker or implanted devices?    Answer:   No   Lipase, blood    Standing Status:   Standing    Number of Occurrences:   1   Comprehensive metabolic panel    Standing Status:   Standing    Number of Occurrences:   1   CBC    Standing Status:   Standing    Number of Occurrences:   1   Urinalysis, Routine w reflex microscopic -Urine, Clean Catch    Standing Status:   Standing    Number of Occurrences:   1    Order Specific Question:   Specimen Source    Answer:   Urine, Clean Catch [76]   Comprehensive metabolic panel     Standing Status:   Standing    Number of Occurrences:   1   CBC    Standing Status:   Standing    Number of Occurrences:   1   Hemoglobin A1c    Standing Status:   Standing    Number of Occurrences:   1   CA 19-9 (SERIAL)    Standing Status:   Standing    Number of Occurrences:   1   Diet  NPO time specified Except for: [employer], Sips with Meds    Standing Status:   Standing    Number of Occurrences:   1    Order Specific Question:   Except for    Answer:   Ice Chips    Order Specific Question:   Except for    Answer:   Sips with Meds   SCDs    Standing Status:   Standing    Number of Occurrences:   1    Order Specific Question:   Laterality    Answer:   Bilateral   Cardiac Monitoring Continuous x 24 hours Indications for use: Other; other indications for use: Monitor for ischemia    Standing Status:   Standing    Number of Occurrences:   1    Order Specific Question:   Indications for use:    Answer:   Other    Order Specific Question:   other indications for use:    Answer:   Monitor for ischemia   Vital signs    Standing Status:   Standing    Number of Occurrences:   1   Notify physician (specify)    Standing Status:   Standing    Number of Occurrences:   20    Order Specific Question:   Notify Physician    Answer:   for pulse less than 55 or greater than 120    Order Specific Question:   Notify Physician    Answer:   for respiratory rate less than 12 or greater than 25    Order Specific Question:   Notify Physician    Answer:   for temperature greater than 100.5 F    Order Specific Question:   Notify Physician    Answer:   for urinary output less than 30 mL/hr for four hours    Order Specific Question:   Notify Physician    Answer:   for systolic BP less than 90 or greater than 160, diastolic BP less than 60 or greater than 100   Progressive Mobility Protocol: No Restrictions    Standing Status:   Standing    Number of Occurrences:   1   If patient diabetic or  glucose greater than 140 notify physician for Sliding Scale Insulin Orders    Standing Status:   Standing    Number of Occurrences:   20   Intake and Output    Standing Status:   Standing    Number of Occurrences:   1   Do not place and if present remove PureWick    Standing Status:   Standing    Number of Occurrences:   1   Initiate Oral Care Protocol    Standing Status:   Standing    Number of Occurrences:   1   Initiate Carrier Fluid Protocol    Standing Status:   Standing    Number of Occurrences:   1   RN may order General Admission PRN Orders utilizing "General Admission PRN medications" (through manage orders) for the following patient needs: allergy symptoms (Claritin), cold sores (Carmex), cough (Robitussin DM), eye irritation (Liquifilm Tears), hemorrhoids (Tucks), indigestion (Maalox), minor skin irritation (Hydrocortisone Cream), muscle pain (Ben Gay), nose irritation (saline nasal spray) and sore throat (Chloraseptic spray).    Standing Status:   Standing    Number of Occurrences:   [phone]   Maintain IV access    Standing Status:   Standing    Number of Occurrences:  1   Full code    Standing Status:   Standing    Number of Occurrences:   1    Order Specific Question:   By:    Answer:   Other   Consult to hospitalist    Standing Status:   Standing    Number of Occurrences:   1    Order Specific Question:   Place call to:    Answer:   [phone]    Order Specific Question:   Reason for Consult    Answer:   Admit    Order Specific Question:   Diagnosis/Clinical Info for Consult:    Answer:   pancreatitis   Pulse oximetry check with vital signs    Standing Status:   Standing    Number of Occurrences:   1   Incentive spirometry    Standing Status:   Standing    Number of Occurrences:   1   Admit to Inpatient (patient's expected length of stay will be greater than 2 midnights or inpatient only procedure)    Standing Status:   Standing    Number of Occurrences:   1     Order Specific Question:   Hospital Area    Answer:   [facility] [[id number]]    Order Specific Question:   Level of Care    Answer:   Telemetry Medical [104]    Order Specific Question:   Covid Evaluation    Answer:   Asymptomatic - no recent exposure (last 10 days) testing not required    Order Specific Question:   Diagnosis    Answer:   Abdominal pain [409811]    Order Specific Question:   Admitting Physician    Answer:   Darrold Junker    Order Specific Question:   Attending Physician    Answer:   Darrold Junker    Order Specific Question:   Certification:    Answer:   I certify this patient will need inpatient services for at least 2 midnights    Order Specific Question:   Estimated Length of Stay    Answer:   2   Aspiration precautions    Standing Status:   Standing    Number of Occurrences:   1   Fall precautions    Standing Status:   Standing    Number of Occurrences:   1    Meds ordered this encounter  Medications   morphine (PF) 4 MG/ML injection 4 mg   ondansetron (ZOFRAN) injection 4 mg   sodium chloride 0.9 % bolus 1,000 mL   iohexol (OMNIPAQUE) 300 MG/ML solution 100 mL   rosuvastatin (CRESTOR) tablet 20 mg   ALPRAZolam (XANAX) tablet 1 mg   lactated ringers infusion   OR Linked Order Group    ondansetron (ZOFRAN) tablet 4 mg    ondansetron (ZOFRAN) injection 4 mg   sodium chloride flush (NS) 0.9 % injection 3 mL   pantoprazole (PROTONIX) injection 40 mg   nicotine (NICODERM CQ - dosed in mg/24 hours) patch 21 mg   heparin injection 5,000 Units   morphine (PF) 2 MG/ML injection 2 mg   diazepam (VALIUM) injection 2.5 mg    Admission Imaging : CT ABDOMEN PELVIS W CONTRAST  Result Date: 07/02/2022 CLINICAL DATA:  Left-sided abdominal pain, suspected pancreatitis. EXAM: CT ABDOMEN AND PELVIS WITH CONTRAST TECHNIQUE: Multidetector CT imaging of the abdomen and pelvis was performed using the standard protocol following bolus  administration of intravenous contrast. RADIATION DOSE REDUCTION: This exam was performed according to the departmental dose-optimization program which includes automated exposure control, adjustment of the mA and/or kV according to patient size and/or use of iterative reconstruction technique. CONTRAST:  OMNIPAQUE IOHEXOL 300 MG/ML  SOLN COMPARISON:  04/07/2022 FINDINGS: Lower chest: Unremarkable Hepatobiliary: Stable 6 mm cyst in segment 2 of the liver. Borderline intrahepatic biliary dilatation. Gallbladder unremarkable. No extrahepatic biliary dilatation. Pancreas: In the pancreatic tail the site of prior mild hypoenhancement and inflammation, there is a larger region of hypoenhancement measuring 3.8 by 3.0 cm on image 18 series 2, and faintly reduced enhancement further distally in the pancreatic tail is well. Moreover, there is a bilobed cystic lesion within the pancreas measuring 2.6 by 1.6 by 2.0 cm. This cystic lesion does not have substantial peripheral enhancement and accordingly I favor a pseudocyst over abscess. In this setting, underlying pancreatic adenocarcinoma is difficult to totally exclude. Although a substantial portion of the pancreatic tail is hypoenhancing, we do not demonstrate lack of enhancement to suggest pancreatic necrosis. Pancreatic body and head appear normal. Mild stranding in the adipose tissues around the inflamed portion of the pancreas. Spleen: Unremarkable Adrenals/Urinary Tract: No clinically significant findings. The median lobe of the prostate gland indents the bladder base. 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 Atherosclerosis is present, including aortoiliac atherosclerotic disease. No pathologic adenopathy. Reproductive: Mild prostatomegaly. Other: No supplemental non-categorized findings. Musculoskeletal:  Posterolateral rod and pedicle screw fixation with interbody fusion at L5-S1. Posterior decompression at L5-S1. IMPRESSION: 1. Enlarging region of hypoenhancement in the pancreatic tail at the site of prior mild hypoenhancement and inflammation. New bilobed cystic lesion centrally within this hypoenhancing region measuring 2.6 by 1.6 by 2.0 cm (volume = 4.4 cm^3). Although the appearance favors a pseudocyst over abscess, the possibility of underlying pancreatic adenocarcinoma with secondary inflammation is difficult to totally exclude. Assuming typical clinical presentation and scenario for pancreatitis, follow up pancreatic protocol MRI with and without contrast is recommended in 2-3 months time to reassess the region and ensure expected improvement. If the clinical scenario is atypical for pancreatitis, then a more aggressive course for potential sampling of the pancreatic tail should be considered. 2. 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 3. Borderline intrahepatic biliary dilatation. 4. Mild prostatomegaly. 5. Prior fusion at L5-S1. 6. Aortic atherosclerosis. Aortic Atherosclerosis (ICD10-I70.0). Electronically Signed   By: Gaylyn Rong M.D.   On: 07/02/2022 18:05   Physical Examination: Vitals:   07/02/22 1500 07/02/22 1708  BP: (!) 145/103 116/77  Pulse: 98 70  Temp: 98.5 F (36.9 C)   Resp: 17 16  Weight: 72.6 kg   SpO2: 96% 97%  TempSrc: Oral    Physical Exam Vitals and nursing note reviewed.  Constitutional:      General: He is not in acute distress.    Appearance: Normal appearance. He is not ill-appearing, toxic-appearing or diaphoretic.  HENT:     Head: Normocephalic and atraumatic.     Right Ear: Hearing and external ear normal.     Left Ear: Hearing and external ear normal.     Nose: Nose normal. No nasal deformity.     Mouth/Throat:     Lips: Pink.      Mouth: Mucous membranes are moist.     Tongue:  No lesions.     Pharynx: Oropharynx is clear.  Eyes:     Extraocular Movements: Extraocular movements intact.     Pupils: Pupils are equal, round, and reactive to light.  Cardiovascular:     Rate and Rhythm: Normal rate and regular rhythm.     Pulses: Normal pulses.     Heart sounds: Normal heart sounds.  Pulmonary:     Effort: Pulmonary effort is normal.     Breath sounds: Normal breath sounds.  Abdominal:     General: Bowel sounds are normal. There is no distension.     Palpations: Abdomen is soft. There is no mass.     Tenderness: There is abdominal tenderness. There is no guarding.     Hernia: No hernia is present.  Musculoskeletal:     Right lower leg: No edema.     Left lower leg: No edema.  Skin:    General: Skin is warm.  Neurological:     General: No focal deficit present.     Mental Status: He is alert and oriented to person, place, and time.     Cranial Nerves: Cranial nerves 2-12 are intact.     Motor: Motor function is intact.  Psychiatric:        Attention and Perception: Attention normal.        Mood and Affect: Mood normal.        Speech: Speech normal.        Behavior: Behavior normal. Behavior is cooperative.        Cognition and Memory: Cognition normal.     Assessment and Plan: * Abdominal pain 2/2 acute pancreatitis. Pt has normal lipase, came with abdominal pain ct imaging showing pancreatitis.  PRN pain control with morphine. MIVF.  NPO except for sips with meds.    Pancreatitis Pt admitted with pancreatitis. MRI ordered for cyst eval.  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  D/W GI Dr. [name] and nothing to do as it is chronic.  No bleeding or anemia.  IV PPI. MIVF.    Hyperglycemia A1c ? Prediabetes or diabetes.    Hypertension Will start pt on amlodipine.  If additional BP control will add BB. PRN hydralazine.   Current tobacco use Counseled and  nicotine patch.     DVT prophylaxis:  Heparin Code Status:  Full code    07/02/2022    6:04 PM  Advanced Directives  Does Patient Have a Medical Advance Directive? No    Family Communication:  None Emergency Contact: Contact Information     Name Relation Home Work Mobile   Taher,CECILIA M Spouse [phone]     Eulogio, Requena   [phone]       Disposition Plan:  Home Consults: None Admission status: Inpatient Unit / Expected LOS: Med/tele/1 to 2 days.  Gertha Calkin MD [facility]  6 PM- 2 AM. [phone]( Pager )  For questions regarding this patient please use [email or web address] to contact the current [facility] MD.   Bonita Quin may also call [phone] to contact current Assigned [facility] Attending/Consulting MD for this patient.

## 2022-07-02 NOTE — ED Triage Notes (Signed)
Pt sts that he has been having abd pain on the left side. Pt sts that he has a hx of pancreatitis and feels like it is the same.

## 2022-07-02 NOTE — ED Notes (Signed)
Pt to MRI

## 2022-07-03 DIAGNOSIS — K8689 Other specified diseases of pancreas: Secondary | ICD-10-CM

## 2022-07-03 LAB — CBC
HCT: 37.4 % — ABNORMAL LOW (ref 39.0–52.0)
Hemoglobin: 12.3 g/dL — ABNORMAL LOW (ref 13.0–17.0)
MCH: 29.1 pg (ref 26.0–34.0)
MCHC: 32.9 g/dL (ref 30.0–36.0)
MCV: 88.4 fL (ref 80.0–100.0)
Platelets: 200 10*3/uL (ref 150–400)
RBC: 4.23 MIL/uL (ref 4.22–5.81)
RDW: 13.3 % (ref 11.5–15.5)
WBC: 6.1 10*3/uL (ref 4.0–10.5)
nRBC: 0 % (ref 0.0–0.2)

## 2022-07-03 LAB — COMPREHENSIVE METABOLIC PANEL
ALT: 15 U/L (ref 0–44)
AST: 14 U/L — ABNORMAL LOW (ref 15–41)
Albumin: 3.5 g/dL (ref 3.5–5.0)
Alkaline Phosphatase: 51 U/L (ref 38–126)
Anion gap: 5 (ref 5–15)
BUN: 8 mg/dL (ref 6–20)
CO2: 26 mmol/L (ref 22–32)
Calcium: 8.5 mg/dL — ABNORMAL LOW (ref 8.9–10.3)
Chloride: 110 mmol/L (ref 98–111)
Creatinine, Ser: 0.76 mg/dL (ref 0.61–1.24)
GFR, Estimated: >60 mL/min (ref >60)
Glucose, Bld: 105 mg/dL — ABNORMAL HIGH (ref 70–99)
Potassium: 4 mmol/L (ref 3.5–5.1)
Sodium: 141 mmol/L (ref 135–145)
Total Bilirubin: 0.6 mg/dL (ref 0.3–1.2)
Total Protein: 6.4 g/dL — ABNORMAL LOW (ref 6.5–8.1)

## 2022-07-03 LAB — HEMOGLOBIN A1C
Hgb A1c MFr Bld: 6.2 % — ABNORMAL HIGH (ref 4.8–5.6)
Mean Plasma Glucose: 131.24 mg/dL

## 2022-07-03 MED ORDER — LISINOPRIL 20 MG PO TABS
20.0000 mg | ORAL_TABLET | Freq: Every day | ORAL | Status: DC
  Administered 2022-07-03 – 2022-07-07 (×4): 20 mg via ORAL
  Filled 2022-07-03 (×5): qty 1

## 2022-07-03 MED ORDER — OXYCODONE HCL 5 MG PO TABS
10.0000 mg | ORAL_TABLET | ORAL | Status: DC | PRN
  Administered 2022-07-03 – 2022-07-07 (×11): 10 mg via ORAL
  Filled 2022-07-03 (×11): qty 2

## 2022-07-03 NOTE — Consult Note (Signed)
Inpatient Consultation   Patient ID: David Harrell is a 62 y.o. male.  Requesting Provider: Rosezetta Schlatter, MD  Date of Admission: 07/02/2022  Date of Consult: 07/03/22   Reason for Consultation: Pancreatic tail lesion  Patient's Chief Complaint:   Chief Complaint  Patient presents with   Abdominal Pain    62 year old Caucasian male with history of chronic back pain, hypertension, chronic opioid dependence, tobacco dependence and history of alcohol use (no alcohol in the last 20 years) who presents to the hospital with recurrent left lower quadrant pain.  Pt with episode of acute pancreatitis at the end of January for which he was hospitalized. He reports he was doing somewhat better until recently where he started developing more LLQ pain which is similar to his episode of pancreatitis. His lipase has never been elevated on either episode.  He continues to have postprandial discomfort.  Underwent both CT and MRI with and without-demonstrating multilocular pancreatic tail lesion measuring 4.4 x 3.1 x 2.8 cm with pancreatic main duct dilation associated and parenchymal atrophy.  All this potentially could be a pseudocyst concern given clinical picture for pancreatic malignancy. CA 19-9 pending.  LFTs within normal limits. Splenic vein demonstrates occlusion with collateralization  He reports his bowel movements are regular without melena or hematochezia.  No hematemesis or coffee-ground emesis  Has had a bruise at LLQ site since his pancreatitis in January that he reports is improving, but still tender.  Patient is currently resting in bed.  He is currently NPO.  He notes that he has unintentionally lost approximately 40 pounds since December of last year.  Triglycerides have never been elevated.  No family history of pancreatic disease or malignancy.    Continues to smoke ~1/2 ppd of cigarettes.  He has been cutting back over the last 5 years No etoh in 20 years. No illicit substance  use No new medications or changes in dosages.  Denies NSAIDs, Anti-plt agents, and anticoagulants Denies family history of gastrointestinal disease and malignancy Previous Endoscopies: 07/1998- EGD- unknown findings- no records outside of date available  Pt reports colonoscopy within the last 10 years, but I cannot find record of this in [facility] or [facility] EMR    Past Medical History:  Diagnosis Date   Arthritis    Hiatal hernia    Hypertension     Past Surgical History:  Procedure Laterality Date   JOINT REPLACEMENT      Allergies  Allergen Reactions   Aspirin    Penicillins    Tape    Vicodin [Hydrocodone-Acetaminophen]     History reviewed. No pertinent family history.  Social History   Tobacco Use   Smoking status: Every Day    Packs/day: 0.50    Years: 40.00    Additional pack years: 0.00    Total pack years: 20.00    Types: Cigarettes   Smokeless tobacco: Never  Substance Use Topics   Alcohol use: Not Currently   Drug use: Yes    Frequency: 1.0 times per week    Types: Marijuana     Pertinent GI related history and allergies were reviewed with the patient  Review of Systems  Constitutional:  Negative for activity change, appetite change, chills, diaphoresis, fatigue, fever and unexpected weight change.  HENT:  Negative for trouble swallowing and voice change.   Respiratory:  Negative for shortness of breath and wheezing.   Cardiovascular:  Negative for chest pain, palpitations and leg swelling.  Gastrointestinal:  Positive for  abdominal pain. Negative for abdominal distention, anal bleeding, blood in stool, constipation, diarrhea, nausea and vomiting.  Musculoskeletal:  Negative for arthralgias and myalgias.  Skin:  Negative for color change and pallor.  Neurological:  Negative for dizziness, syncope and weakness.  Psychiatric/Behavioral:  Negative for confusion. The patient is not nervous/anxious.   All other systems reviewed and are negative.     Medications Home Medications No current facility-administered medications on file prior to encounter.   Current Outpatient Medications on File Prior to Encounter  Medication Sig Dispense Refill   ALPRAZolam (XANAX) 1 MG tablet Take 1 mg by mouth every 12 (twelve) hours as needed for sleep or anxiety.     lisinopril (ZESTRIL) 20 MG tablet Take 20 mg by mouth daily.     morphine (MS CONTIN) 15 MG 12 hr tablet Take 15 mg by mouth every evening.     morphine (MS CONTIN) 30 MG 12 hr tablet Take 30 mg by mouth daily after breakfast.     Oxycodone HCl 10 MG TABS Take 1 tablet by mouth every 4 (four) hours as needed.     rosuvastatin (CRESTOR) 20 MG tablet Take 20 mg by mouth daily.     Pertinent GI related medications were reviewed with the patient  Inpatient Medications  Current Facility-Administered Medications:    ALPRAZolam (XANAX) tablet 1 mg, 1 mg, Oral, Q12H PRN, Gertha Calkin, MD   heparin injection 5,000 Units, 5,000 Units, Subcutaneous, Q8H, Gertha Calkin, MD, 5,000 Units at 07/03/22 8295   lactated ringers infusion, , Intravenous, Continuous, Gertha Calkin, MD, Last Rate: 100 mL/hr at 07/02/22 2202, New Bag at 07/02/22 2202   morphine (PF) 2 MG/ML injection 2 mg, 2 mg, Intravenous, Q4H PRN, Gertha Calkin, MD, 2 mg at 07/03/22 0422   nicotine (NICODERM CQ - dosed in mg/24 hours) patch 21 mg, 21 mg, Transdermal, Daily, Allena Katz, Eliezer Mccoy, MD   ondansetron (ZOFRAN) tablet 4 mg, 4 mg, Oral, Q6H PRN **OR** ondansetron (ZOFRAN) injection 4 mg, 4 mg, Intravenous, Q6H PRN, Irena Cords V, MD, 4 mg at 07/03/22 0422   pantoprazole (PROTONIX) injection 40 mg, 40 mg, Intravenous, Q12H, Irena Cords V, MD, 40 mg at 07/02/22 1941   rosuvastatin (CRESTOR) tablet 20 mg, 20 mg, Oral, Daily, Patel, Ekta V, MD   sodium chloride flush (NS) 0.9 % injection 3 mL, 3 mL, Intravenous, Q12H, Irena Cords V, MD, 3 mL at 07/02/22 2158  lactated ringers 100 mL/hr at 07/02/22 2202    ALPRAZolam, morphine injection,  ondansetron **OR** ondansetron (ZOFRAN) IV   Objective   Vitals:   07/02/22 2135 07/03/22 0416 07/03/22 0738 07/03/22 0747  BP: 136/66 124/73 (!) 149/80 130/76  Pulse: 63 (!) 54 (!) 55 (!) 58  Resp: 16 16 16    Temp: 98.4 F (36.9 C) 97.8 F (36.6 C) 98.2 F (36.8 C) 98.1 F (36.7 C)  TempSrc: Oral Oral Oral Oral  SpO2: 100% 100% 99% 97%  Weight: 65.8 kg     Height: 5\' 8"  (1.727 m)        Physical Exam Vitals and nursing note reviewed.  Constitutional:      General: He is not in acute distress.    Appearance: Normal appearance. He is not ill-appearing, toxic-appearing or diaphoretic.  HENT:     Head: Normocephalic and atraumatic.     Nose: Nose normal.     Mouth/Throat:     Mouth: Mucous membranes are moist.     Pharynx: Oropharynx is clear.  Eyes:     General: No scleral icterus.    Extraocular Movements: Extraocular movements intact.  Cardiovascular:     Rate and Rhythm: Normal rate and regular rhythm.     Heart sounds: Normal heart sounds. No murmur heard.    No friction rub. No gallop.  Pulmonary:     Effort: Pulmonary effort is normal. No respiratory distress.     Breath sounds: Normal breath sounds. No wheezing, rhonchi or rales.  Abdominal:     General: Bowel sounds are normal. There is no distension.     Palpations: Abdomen is soft.     Tenderness: There is abdominal tenderness (LLQ at bruise site). There is no guarding or rebound.  Musculoskeletal:     Cervical back: Neck supple.     Right lower leg: No edema.     Left lower leg: No edema.  Skin:    General: Skin is warm and dry.     Coloration: Skin is not jaundiced or pale.     Comments: Faded bruise in LLQ- ttp  Neurological:     General: No focal deficit present.     Mental Status: He is alert and oriented to person, place, and time. Mental status is at baseline.  Psychiatric:        Mood and Affect: Mood normal.        Behavior: Behavior normal.        Thought Content: Thought content normal.         Judgment: Judgment normal.     Laboratory Data Recent Labs  Lab 07/02/22 1503 07/03/22 0533  WBC 11.6* 6.1  HGB 15.2 12.3*  HCT 46.0 37.4*  PLT 298 200   Recent Labs  Lab 07/02/22 1503 07/03/22 0533  NA 139 141  K 4.4 4.0  CL 103 110  CO2 25 26  BUN 9 8  CALCIUM 9.6 8.5*  PROT 8.3* 6.4*  BILITOT 0.6 0.6  ALKPHOS 61 51  ALT 19 15  AST 23 14*  GLUCOSE 148* 105*   No results for input(s): "INR" in the last 168 hours.  Recent Labs    07/02/22 1503  LIPASE 26        Imaging Studies: MR ABDOMEN W WO CONTRAST  Result Date: 07/03/2022 CLINICAL DATA:  Pancreatic tail lesion on CT scan earlier same day. EXAM: MRI ABDOMEN WITHOUT AND WITH CONTRAST TECHNIQUE: Multiplanar multisequence MR imaging of the abdomen was performed both before and after the administration of intravenous contrast. CONTRAST:  7mL GADAVIST GADOBUTROL 1 MMOL/ML IV SOLN COMPARISON:  CT scan from earlier same day. FINDINGS: Lower chest: Unremarkable. Hepatobiliary: No suspicious focal abnormality in the liver parenchyma. Mild intrahepatic biliary duct prominence. No evidence for gallstones. No common bile duct dilatation (5 mm diameter in the head of the pancreas. No evidence for choledocholithiasis Pancreas: 4.4 x 3.1 x 2.8 cm multilocular mass lesion is identified in the tail of pancreas. There is atrophy in the tip of the pancreas with associated main duct dilatation. After IV contrast administration there is enhancement in the periphery of the lesion with enhancing smooth internal septations. Spleen:  No splenomegaly. No focal mass lesion. Adrenals/Urinary Tract: No adrenal nodule or mass. Right kidney unremarkable. 1.7 cm simple cyst noted upper pole left kidney. No followup imaging is recommended. Stomach/Bowel: Stomach is unremarkable. No gastric wall thickening. No evidence of outlet obstruction. Duodenum is normally positioned as is the ligament of Treitz. No small bowel or colonic dilatation within  the visualized abdomen. Vascular/Lymphatic:  No abdominal aortic aneurysm. No abdominal lymphadenopathy. 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 Other:  No intraperitoneal free fluid. Musculoskeletal: No focal suspicious marrow enhancement within the visualized bony anatomy. Artifact from lower lumbar fixation hardware evident. IMPRESSION: 1. 4.4 x 3.1 x 2.8 cm multilocular mass lesion in the tail of the pancreas with associated main duct dilatation and parenchymal atrophy in the tip of the pancreatic tail. Mucinous cystic neoplasm, IPMN, and pseudocyst could all have this appearance although mucinous cystic neoplasm favored based on imaging. Endoscopic ultrasound recommended to further evaluate. 2. 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 3. Mild intrahepatic biliary duct dilatation. No common bile duct dilatation. No evidence for choledocholithiasis. Electronically Signed   By: Kennith Center M.D.   On: 07/03/2022 08:47   MR 3D Recon At Scanner  Result Date: 07/03/2022 CLINICAL DATA:  Pancreatic tail lesion on CT scan earlier same day. EXAM: MRI ABDOMEN WITHOUT AND WITH CONTRAST TECHNIQUE: Multiplanar multisequence MR imaging of the abdomen was performed both before and after the administration of intravenous contrast. CONTRAST:  7mL GADAVIST GADOBUTROL 1 MMOL/ML IV SOLN COMPARISON:  CT scan from earlier same day. FINDINGS: Lower chest: Unremarkable. Hepatobiliary: No suspicious focal abnormality in the liver parenchyma. Mild intrahepatic biliary duct prominence. No evidence for gallstones. No common bile duct dilatation (5 mm diameter in the head of the pancreas. No evidence for choledocholithiasis Pancreas: 4.4 x 3.1 x 2.8 cm multilocular mass lesion is identified in the tail of pancreas. There is atrophy in the tip of the pancreas with associated main duct dilatation. After IV contrast administration there is enhancement in the periphery of the lesion with enhancing smooth internal septations. Spleen:  No  splenomegaly. No focal mass lesion. Adrenals/Urinary Tract: No adrenal nodule or mass. Right kidney unremarkable. 1.7 cm simple cyst noted upper pole left kidney. No followup imaging is recommended. Stomach/Bowel: Stomach is unremarkable. No gastric wall thickening. No evidence of outlet obstruction. Duodenum is normally positioned as is the ligament of Treitz. No small bowel or colonic dilatation within the visualized abdomen. Vascular/Lymphatic: No abdominal aortic aneurysm. No abdominal lymphadenopathy. 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 Other:  No intraperitoneal free fluid. Musculoskeletal: No focal suspicious marrow enhancement within the visualized bony anatomy. Artifact from lower lumbar fixation hardware evident. IMPRESSION: 1. 4.4 x 3.1 x 2.8 cm multilocular mass lesion in the tail of the pancreas with associated main duct dilatation and parenchymal atrophy in the tip of the pancreatic tail. Mucinous cystic neoplasm, IPMN, and pseudocyst could all have this appearance although mucinous cystic neoplasm favored based on imaging. Endoscopic ultrasound recommended to further evaluate. 2. 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 3. Mild intrahepatic biliary duct dilatation. No common bile duct dilatation. No evidence for choledocholithiasis. Electronically Signed   By: Kennith Center M.D.   On: 07/03/2022 08:47   CT ABDOMEN PELVIS W CONTRAST  Result Date: 07/02/2022 CLINICAL DATA:  Left-sided abdominal pain, suspected pancreatitis. EXAM: CT ABDOMEN AND PELVIS WITH CONTRAST TECHNIQUE: Multidetector CT imaging of the abdomen and pelvis was performed using the standard protocol following bolus administration of intravenous contrast. RADIATION DOSE REDUCTION: This exam was performed according to the departmental dose-optimization program which includes automated exposure control, adjustment of the mA and/or kV according to patient size and/or use of iterative reconstruction technique. CONTRAST:  OMNIPAQUE  IOHEXOL 300 MG/ML  SOLN COMPARISON:  04/07/2022 FINDINGS: Lower chest: Unremarkable Hepatobiliary: Stable 6 mm cyst in segment 2 of the liver. Borderline intrahepatic biliary dilatation. Gallbladder unremarkable. No extrahepatic biliary dilatation. Pancreas: In the  pancreatic tail the site of prior mild hypoenhancement and inflammation, there is a larger region of hypoenhancement measuring 3.8 by 3.0 cm on image 18 series 2, and faintly reduced enhancement further distally in the pancreatic tail is well. Moreover, there is a bilobed cystic lesion within the pancreas measuring 2.6 by 1.6 by 2.0 cm. This cystic lesion does not have substantial peripheral enhancement and accordingly I favor a pseudocyst over abscess. In this setting, underlying pancreatic adenocarcinoma is difficult to totally exclude. Although a substantial portion of the pancreatic tail is hypoenhancing, we do not demonstrate lack of enhancement to suggest pancreatic necrosis. Pancreatic body and head appear normal. Mild stranding in the adipose tissues around the inflamed portion of the pancreas. Spleen: Unremarkable Adrenals/Urinary Tract: No clinically significant findings. The median lobe of the prostate gland indents the bladder base. 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 Atherosclerosis is present, including aortoiliac atherosclerotic disease. No pathologic adenopathy. Reproductive: Mild prostatomegaly. Other: No supplemental non-categorized findings. Musculoskeletal: Posterolateral rod and pedicle screw fixation with interbody fusion at L5-S1. Posterior decompression at L5-S1. IMPRESSION: 1. Enlarging region of hypoenhancement in the pancreatic tail at the site of prior mild hypoenhancement and inflammation. New bilobed cystic lesion centrally within this hypoenhancing region  measuring 2.6 by 1.6 by 2.0 cm (volume = 4.4 cm^3). Although the appearance favors a pseudocyst over abscess, the possibility of underlying pancreatic adenocarcinoma with secondary inflammation is difficult to totally exclude. Assuming typical clinical presentation and scenario for pancreatitis, follow up pancreatic protocol MRI with and without contrast is recommended in 2-3 months time to reassess the region and ensure expected improvement. If the clinical scenario is atypical for pancreatitis, then a more aggressive course for potential sampling of the pancreatic tail should be considered. 2. 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 3. Borderline intrahepatic biliary dilatation. 4. Mild prostatomegaly. 5. Prior fusion at L5-S1. 6. Aortic atherosclerosis. Aortic Atherosclerosis (ICD10-I70.0). Electronically Signed   By: Gaylyn Rong M.D.   On: 07/02/2022 18:05    Assessment:   # Pancreatic Tail Lesion- noted on imaging - multilocular pancreatic tail lesion measuring 4.4 x 3.1 x 2.8 cm with pancreatic main duct dilation associated and parenchymal atrophy - CA19-9 pending - Suspicious for malignancy given other clinical findings, however, may be IPMN given recent pancreatitis episode in active tobacco use and h/o etoh use (none in 20 years)  # Chronic Pancreatitis- possible acute episode - potentially had Cullen sign on previous episode. No flank or other bruising. 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 Have contacted patient navigator Oncology has been consulted by primary team  CA19-9 pending  No signs of GIB- no melena/hematochezia/hematemesis/coffee ground emesis. BUN/Cr ratio wnl. 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 If none present, can consider anticoagulation at that juncture LFTs wnl Counseled on tobacco cessation  Will arrange for outpatient follow up  GI to sign off. Available as needed. Please do not hesitate to call regarding questions or concerns.  I personally performed the service.  Management of other medical comorbidities as per primary team  Thank you for allowing [place] to participate in this patient's care.   [name], DO [facility]  Portions of the record may have been created with voice recognition software. Occasional wrong-word or 'sound-a-like' substitutions may have occurred due to the inherent limitations of voice recognition software.  Read the chart carefully and recognize, using context, where substitutions may have occurred.

## 2022-07-03 NOTE — Progress Notes (Signed)
Progress Note   [name]:[id number] DOB: Aug 31, 1960 DOA: 07/02/2022     1 DOS: the patient was seen and examined on 07/03/2022    Subjective:  Patient seen and examined at bedside this morning I spent extensive time reviewing patient's CT scan as well as MRI of the abdomen with him Findings showing a lesion in the pancreatic tail to rule out carcinoma I have consulted gastroenterologist as well as oncologist  Brief hospital course: David Harrell is an 62 y.o. male seen in ed for abd pain and nausea. Pt states he has not drank in over 20 years. And the only medicine change he recalls is in [place] when got started in [place] and his first episode of pancreatitis was. Pt reports that he has bene told of seizures but does not have any seizure meds and has not had any seizures,. Pt has hardware in his neck and does not know if it is MRI compatible. D/W him results of pancreatic cyst nad need for MRI.     Assessment and Plan:  Pancreatic tail lesion  Abdominal pain 2/2 acute pancreatitis. Pt has normal lipase, came with abdominal pain ct imaging showing pancreatitis.  PRN pain control with morphine. MIVF.  Advancement of diet as tolerated Have consulted oncologist as well as gastroenterologist Have reviewed the CT scan and MRI of the abdomen results and I have discussed this with the patient. Patient would need further investigation to rule out carcinoma at this time   Pancreatitis Pt admitted with pancreatitis. MRI ordered for cyst eval.  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  D/W GI Dr. [name] and nothing to do as it is chronic.  No bleeding or anemia.  IV PPI. MIVF.     Hyperglycemia A1c ? Prediabetes or diabetes.      Hypertension Will start pt on amlodipine.  If additional BP control will add BB. PRN hydralazine.     Current tobacco use Counseled and nicotine patch.        DVT prophylaxis:  Heparin  Code Status:  Full  code    Physical Exam: Physical Exam Vitals and nursing note reviewed.  Constitutional:      General: He is not in acute distress. HENT:     Head: Normocephalic and atraumatic.  Eyes:     Extraocular Movements: Extraocular movements intact.     Pupils: Pupils are equal, round, and reactive to light.  Cardiovascular:     Rate and Rhythm: Normal rate and regular rhythm.  Pulmonary:     Effort: Pulmonary effort is normal.     Breath sounds: Normal breath sounds.  Abdominal:     General: Bowel sounds are normal. There is no distension.     Tenderness: There is abdominal tenderness. There is no guarding.  Musculoskeletal:     Right lower leg: No edema.     Left lower leg: No edema.  Skin:    General: Skin is warm.  Neurological:     General: No focal deficit present.     Data Reviewed: I have reviewed patient's CT scan as well as MRI results as discussed above   Disposition: Status is: Inpatient Continues to meet inpatient criteria given management for current abdominal pain due to acute pancreatitis   Planned Discharge Destination: Home  Time spent: 55 minutes   Vitals:   07/02/22 2135 07/03/22 0416 07/03/22 0738 07/03/22 0747  BP: 136/66 124/73 (!) 149/80 130/76  Pulse: 63 (!) 54 (!) 55 Marland Kitchen)  58  Resp: 16 16 16    Temp: 98.4 F (36.9 C) 97.8 F (36.6 C) 98.2 F (36.8 C) 98.1 F (36.7 C)  TempSrc: Oral Oral Oral Oral  SpO2: 100% 100% 99% 97%  Weight: 65.8 kg     Height: 5\' 8"  (1.727 m)        Author: Loyce Dys, MD 07/03/2022 2:41 PM  For on call review [email or web address].

## 2022-07-04 DIAGNOSIS — K8689 Other specified diseases of pancreas: Secondary | ICD-10-CM | POA: Diagnosis not present

## 2022-07-04 LAB — BASIC METABOLIC PANEL
Anion gap: 5 (ref 5–15)
BUN: 6 mg/dL (ref 6–20)
CO2: 27 mmol/L (ref 22–32)
Calcium: 8.6 mg/dL — ABNORMAL LOW (ref 8.9–10.3)
Chloride: 108 mmol/L (ref 98–111)
Creatinine, Ser: 0.77 mg/dL (ref 0.61–1.24)
GFR, Estimated: >60 mL/min (ref >60)
Glucose, Bld: 107 mg/dL — ABNORMAL HIGH (ref 70–99)
Potassium: 3.7 mmol/L (ref 3.5–5.1)
Sodium: 140 mmol/L (ref 135–145)

## 2022-07-04 LAB — CBC WITH DIFFERENTIAL/PLATELET
Abs Immature Granulocytes: 0.01 10*3/uL (ref 0.00–0.07)
Basophils Absolute: 0.1 10*3/uL (ref 0.0–0.1)
Basophils Relative: 1 %
Eosinophils Absolute: 0.2 10*3/uL (ref 0.0–0.5)
Eosinophils Relative: 3 %
HCT: 37.8 % — ABNORMAL LOW (ref 39.0–52.0)
Hemoglobin: 12.4 g/dL — ABNORMAL LOW (ref 13.0–17.0)
Immature Granulocytes: 0 %
Lymphocytes Relative: 46 %
Lymphs Abs: 2.7 10*3/uL (ref 0.7–4.0)
MCH: 29 pg (ref 26.0–34.0)
MCHC: 32.8 g/dL (ref 30.0–36.0)
MCV: 88.5 fL (ref 80.0–100.0)
Monocytes Absolute: 0.4 10*3/uL (ref 0.1–1.0)
Monocytes Relative: 7 %
Neutro Abs: 2.5 10*3/uL (ref 1.7–7.7)
Neutrophils Relative %: 43 %
Platelets: 198 10*3/uL (ref 150–400)
RBC: 4.27 MIL/uL (ref 4.22–5.81)
RDW: 13.2 % (ref 11.5–15.5)
WBC: 5.8 10*3/uL (ref 4.0–10.5)
nRBC: 0 % (ref 0.0–0.2)

## 2022-07-04 NOTE — Progress Notes (Signed)
Progress Note   [name] ZOX:[id number] DOB: 12-04-1960 DOA: 07/02/2022     2 DOS: the patient was seen and examined on 07/04/2022    Subjective:  Patient seen and examined at bedside this morning Still has some abdominal pain however improving Oncology and gastroenterologist on board   Brief hospital course: David Harrell is an 62 y.o. male seen in ed for abd pain and nausea. Pt states he has not drank in over 20 years. And the only medicine change he recalls is in [place] when got started in [place] and his first episode of pancreatitis was. Pt reports that he has bene told of seizures but does not have any seizure meds and has not had any seizures,. Pt has hardware in his neck and does not know if it is MRI compatible. D/W him results of pancreatic cyst nad need for MRI.      Assessment and Plan:   Pancreatic tail lesion  Abdominal pain secondary to acute pancreatitis as well as pancreatic tail mass Pt has normal lipase, came with abdominal pain ct imaging showing pancreatitis with pancreatic tail mass confirmed by MRI PRN pain control with morphine. MIVF.  Advancement of diet as tolerated Have consulted oncologist as well as gastroenterologist-we appreciate input Have reviewed the CT scan and MRI of the abdomen results and I have discussed this with the patient. Patient would need further investigation to rule out carcinoma at this time   Pancreatitis Pt admitted with pancreatitis. 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  D/W GI Dr. [name] and nothing to do as it is chronic.  No bleeding or anemia.  IV PPI. MIVF.     Hyperglycemia A1c ? Prediabetes or diabetes.      Hypertension Continue lisinopril     Current tobacco use Counseled and nicotine patch.        DVT prophylaxis:  Heparin   Code Status:  Full code       Physical Exam: Physical Exam Vitals and nursing note reviewed.  Constitutional:      General: He is not in  acute distress. HENT:     Head: Normocephalic and atraumatic.  Eyes:     Extraocular Movements: Extraocular movements intact.     Pupils: Pupils are equal, round, and reactive to light.  Cardiovascular:     Rate and Rhythm: Normal rate and regular rhythm.  Pulmonary:     Effort: Pulmonary effort is normal.     Breath sounds: Normal breath sounds.  Abdominal:     General: Bowel sounds are normal. There is no distension.     Tenderness: There is abdominal tenderness in epigastric region. There is no guarding.  Musculoskeletal:     Right lower leg: No edema.     Left lower leg: No edema.  Skin:    General: Skin is warm.  Neurological:     General: No focal deficit present.     Data Reviewed: I have personally reviewed patient's laboratory results showing sodium 140 potassium 3.9   Disposition: Status is: Inpatient Continues to meet inpatient criteria given management for current abdominal pain due to acute pancreatitis as well as pancreatic mass needing several consultants input    Planned Discharge Destination: Home   Time spent: 50 minutes   Family communication: According to the patient he would like me to hold on weight discussing his current medical condition and findings with the wife until biopsy is done and diagnosis is confirmed  Vitals:   07/03/22 1920 07/04/22 0600 07/04/22 0810 07/04/22 0810  BP: 137/79 133/81 (!) 141/77 (!) 141/77  Pulse: (!) 59 60 (!) 52 (!) 52  Resp: 16 16 14 14   Temp: 98.2 F (36.8 C) 98.4 F (36.9 C) 97.8 F (36.6 C) 97.8 F (36.6 C)  TempSrc: Oral Oral Oral   SpO2: 100% 100% 99% 99%  Weight:      Height:       Author: Loyce Dys, MD 07/04/2022 2:36 PM  For on call review [email or web address].

## 2022-07-04 NOTE — H&P (View-Only) (Signed)
Inpatient Follow-up/Progress Note   Patient ID: David Harrell is a 62 y.o. male.  Overnight Events / Subjective Findings NAEON. Still with some abdominal comfortable. Tolerating liquid diet. No other acute GI complaints.  Review of Systems  Constitutional:  Negative for activity change, appetite change, chills, diaphoresis, fatigue, fever and unexpected weight change.  HENT:  Negative for trouble swallowing and voice change.   Respiratory:  Negative for shortness of breath and wheezing.   Cardiovascular:  Negative for chest pain, palpitations and leg swelling.  Gastrointestinal:  Positive for abdominal pain (mild). Negative for abdominal distention, anal bleeding, blood in stool, constipation, diarrhea, nausea and vomiting.  Musculoskeletal:  Negative for arthralgias and myalgias.  Skin:  Negative for color change and pallor.  Neurological:  Negative for dizziness, syncope and weakness.  Psychiatric/Behavioral:  Negative for confusion. The patient is not nervous/anxious.   All other systems reviewed and are negative.    Medications  Current Facility-Administered Medications:    ALPRAZolam (XANAX) tablet 1 mg, 1 mg, Oral, Q12H PRN, Irena Cords V, MD, 1 mg at 07/04/22 0546   heparin injection 5,000 Units, 5,000 Units, Subcutaneous, Q8H, Gertha Calkin, MD, 5,000 Units at 07/04/22 0546   lactated ringers infusion, , Intravenous, Continuous, Gertha Calkin, MD, Last Rate: 100 mL/hr at 07/04/22 0545, New Bag at 07/04/22 0545   lisinopril (ZESTRIL) tablet 20 mg, 20 mg, Oral, Daily, Djan, Prince T, MD, 20 mg at 07/04/22 0840   morphine (PF) 2 MG/ML injection 2 mg, 2 mg, Intravenous, Q4H PRN, Irena Cords V, MD, 2 mg at 07/04/22 0546   nicotine (NICODERM CQ - dosed in mg/24 hours) patch 21 mg, 21 mg, Transdermal, Daily, Irena Cords V, MD, 21 mg at 07/04/22 0841   ondansetron (ZOFRAN) tablet 4 mg, 4 mg, Oral, Q6H PRN **OR** ondansetron (ZOFRAN) injection 4 mg, 4 mg, Intravenous, Q6H PRN, Irena Cords V, MD, 4 mg at 07/03/22 0422   oxyCODONE (Oxy IR/ROXICODONE) immediate release tablet 10 mg, 10 mg, Oral, Q4H PRN, Rosezetta Schlatter T, MD, 10 mg at 07/04/22 1011   pantoprazole (PROTONIX) injection 40 mg, 40 mg, Intravenous, Q12H, Irena Cords V, MD, 40 mg at 07/04/22 0840   rosuvastatin (CRESTOR) tablet 20 mg, 20 mg, Oral, Daily, Irena Cords V, MD, 20 mg at 07/04/22 0840   sodium chloride flush (NS) 0.9 % injection 3 mL, 3 mL, Intravenous, Q12H, Irena Cords V, MD, 3 mL at 07/04/22 0841  lactated ringers 100 mL/hr at 07/04/22 0545    ALPRAZolam, morphine injection, ondansetron **OR** ondansetron (ZOFRAN) IV, oxyCODONE   Objective    Vitals:   07/03/22 1920 07/04/22 0600 07/04/22 0810 07/04/22 0810  BP: 137/79 133/81 (!) 141/77 (!) 141/77  Pulse: (!) 59 60 (!) 52 (!) 52  Resp: 16 16 14 14   Temp: 98.2 F (36.8 C) 98.4 F (36.9 C) 97.8 F (36.6 C) 97.8 F (36.6 C)  TempSrc: Oral Oral Oral   SpO2: 100% 100% 99% 99%  Weight:      Height:         Physical Exam Vitals and nursing note reviewed.  Constitutional:      General: He is not in acute distress.    Appearance: Normal appearance. He is not ill-appearing, toxic-appearing or diaphoretic.  HENT:     Head: Normocephalic and atraumatic.     Nose: Nose normal.     Mouth/Throat:     Mouth: Mucous membranes are moist.     Pharynx: Oropharynx is clear.  Eyes:     General: No scleral icterus.    Extraocular Movements: Extraocular movements intact.  Cardiovascular:     Rate and Rhythm: Normal rate and regular rhythm.     Heart sounds: Normal heart sounds. No murmur heard.    No friction rub. No gallop.  Pulmonary:     Effort: Pulmonary effort is normal. No respiratory distress.     Breath sounds: Normal breath sounds. No wheezing, rhonchi or rales.  Abdominal:     General: Bowel sounds are normal. There is no distension.     Palpations: Abdomen is soft.     Tenderness: There is abdominal tenderness (mild llq). There is no  guarding or rebound.  Musculoskeletal:     Cervical back: Neck supple.     Right lower leg: No edema.     Left lower leg: No edema.  Skin:    General: Skin is warm and dry.     Coloration: Skin is not jaundiced or pale.  Neurological:     General: No focal deficit present.     Mental Status: He is alert and oriented to person, place, and time. Mental status is at baseline.  Psychiatric:        Mood and Affect: Mood normal.        Behavior: Behavior normal.        Thought Content: Thought content normal.        Judgment: Judgment normal.      Laboratory Data Recent Labs  Lab 07/02/22 1503 07/03/22 0533 07/04/22 0551  WBC 11.6* 6.1 5.8  HGB 15.2 12.3* 12.4*  HCT 46.0 37.4* 37.8*  PLT 298 200 198  NEUTOPHILPCT  --   --  43  LYMPHOPCT  --   --  46  MONOPCT  --   --  7  EOSPCT  --   --  3   Recent Labs  Lab 07/02/22 1503 07/03/22 0533 07/04/22 0551  NA 139 141 140  K 4.4 4.0 3.7  CL 103 110 108  CO2 25 26 27   BUN 9 8 6   CREATININE 0.87 0.76 0.77  CALCIUM 9.6 8.5* 8.6*  PROT 8.3* 6.4*  --   BILITOT 0.6 0.6  --   ALKPHOS 61 51  --   ALT 19 15  --   AST 23 14*  --   GLUCOSE 148* 105* 107*   No results for input(s): "INR" in the last 168 hours.    Imaging Studies: MR ABDOMEN W WO CONTRAST  Result Date: 07/03/2022 CLINICAL DATA:  Pancreatic tail lesion on CT scan earlier same day. EXAM: MRI ABDOMEN WITHOUT AND WITH CONTRAST TECHNIQUE: Multiplanar multisequence MR imaging of the abdomen was performed both before and after the administration of intravenous contrast. CONTRAST:  7mL GADAVIST GADOBUTROL 1 MMOL/ML IV SOLN COMPARISON:  CT scan from earlier same day. FINDINGS: Lower chest: Unremarkable. Hepatobiliary: No suspicious focal abnormality in the liver parenchyma. Mild intrahepatic biliary duct prominence. No evidence for gallstones. No common bile duct dilatation (5 mm diameter in the head of the pancreas. No evidence for choledocholithiasis Pancreas: 4.4 x 3.1 x  2.8 cm multilocular mass lesion is identified in the tail of pancreas. There is atrophy in the tip of the pancreas with associated main duct dilatation. After IV contrast administration there is enhancement in the periphery of the lesion with enhancing smooth internal septations. Spleen:  No splenomegaly. No focal mass lesion. Adrenals/Urinary Tract: No adrenal nodule or mass. Right kidney unremarkable. 1.7 cm simple  cyst noted upper pole left kidney. No followup imaging is recommended. Stomach/Bowel: Stomach is unremarkable. No gastric wall thickening. No evidence of outlet obstruction. Duodenum is normally positioned as is the ligament of Treitz. No small bowel or colonic dilatation within the visualized abdomen. Vascular/Lymphatic: No abdominal aortic aneurysm. No abdominal lymphadenopathy. 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 Other:  No intraperitoneal free fluid. Musculoskeletal: No focal suspicious marrow enhancement within the visualized bony anatomy. Artifact from lower lumbar fixation hardware evident. IMPRESSION: 1. 4.4 x 3.1 x 2.8 cm multilocular mass lesion in the tail of the pancreas with associated main duct dilatation and parenchymal atrophy in the tip of the pancreatic tail. Mucinous cystic neoplasm, IPMN, and pseudocyst could all have this appearance although mucinous cystic neoplasm favored based on imaging. Endoscopic ultrasound recommended to further evaluate. 2. 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 3. Mild intrahepatic biliary duct dilatation. No common bile duct dilatation. No evidence for choledocholithiasis. Electronically Signed   By: Kennith Center M.D.   On: 07/03/2022 08:47   MR 3D Recon At Scanner  Result Date: 07/03/2022 CLINICAL DATA:  Pancreatic tail lesion on CT scan earlier same day. EXAM: MRI ABDOMEN WITHOUT AND WITH CONTRAST TECHNIQUE: Multiplanar multisequence MR imaging of the abdomen was performed both before and after the administration of intravenous contrast. CONTRAST:   7mL GADAVIST GADOBUTROL 1 MMOL/ML IV SOLN COMPARISON:  CT scan from earlier same day. FINDINGS: Lower chest: Unremarkable. Hepatobiliary: No suspicious focal abnormality in the liver parenchyma. Mild intrahepatic biliary duct prominence. No evidence for gallstones. No common bile duct dilatation (5 mm diameter in the head of the pancreas. No evidence for choledocholithiasis Pancreas: 4.4 x 3.1 x 2.8 cm multilocular mass lesion is identified in the tail of pancreas. There is atrophy in the tip of the pancreas with associated main duct dilatation. After IV contrast administration there is enhancement in the periphery of the lesion with enhancing smooth internal septations. Spleen:  No splenomegaly. No focal mass lesion. Adrenals/Urinary Tract: No adrenal nodule or mass. Right kidney unremarkable. 1.7 cm simple cyst noted upper pole left kidney. No followup imaging is recommended. Stomach/Bowel: Stomach is unremarkable. No gastric wall thickening. No evidence of outlet obstruction. Duodenum is normally positioned as is the ligament of Treitz. No small bowel or colonic dilatation within the visualized abdomen. Vascular/Lymphatic: No abdominal aortic aneurysm. No abdominal lymphadenopathy. 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 Other:  No intraperitoneal free fluid. Musculoskeletal: No focal suspicious marrow enhancement within the visualized bony anatomy. Artifact from lower lumbar fixation hardware evident. IMPRESSION: 1. 4.4 x 3.1 x 2.8 cm multilocular mass lesion in the tail of the pancreas with associated main duct dilatation and parenchymal atrophy in the tip of the pancreatic tail. Mucinous cystic neoplasm, IPMN, and pseudocyst could all have this appearance although mucinous cystic neoplasm favored based on imaging. Endoscopic ultrasound recommended to further evaluate. 2. 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 3. Mild intrahepatic biliary duct dilatation. No common bile duct dilatation. No evidence for  choledocholithiasis. Electronically Signed   By: Kennith Center M.D.   On: 07/03/2022 08:47   CT ABDOMEN PELVIS W CONTRAST  Result Date: 07/02/2022 CLINICAL DATA:  Left-sided abdominal pain, suspected pancreatitis. EXAM: CT ABDOMEN AND PELVIS WITH CONTRAST TECHNIQUE: Multidetector CT imaging of the abdomen and pelvis was performed using the standard protocol following bolus administration of intravenous contrast. RADIATION DOSE REDUCTION: This exam was performed according to the departmental dose-optimization program which includes automated exposure control, adjustment of the mA and/or kV according to patient size and/or  use of iterative reconstruction technique. CONTRAST:  OMNIPAQUE IOHEXOL 300 MG/ML  SOLN COMPARISON:  04/07/2022 FINDINGS: Lower chest: Unremarkable Hepatobiliary: Stable 6 mm cyst in segment 2 of the liver. Borderline intrahepatic biliary dilatation. Gallbladder unremarkable. No extrahepatic biliary dilatation. Pancreas: In the pancreatic tail the site of prior mild hypoenhancement and inflammation, there is a larger region of hypoenhancement measuring 3.8 by 3.0 cm on image 18 series 2, and faintly reduced enhancement further distally in the pancreatic tail is well. Moreover, there is a bilobed cystic lesion within the pancreas measuring 2.6 by 1.6 by 2.0 cm. This cystic lesion does not have substantial peripheral enhancement and accordingly I favor a pseudocyst over abscess. In this setting, underlying pancreatic adenocarcinoma is difficult to totally exclude. Although a substantial portion of the pancreatic tail is hypoenhancing, we do not demonstrate lack of enhancement to suggest pancreatic necrosis. Pancreatic body and head appear normal. Mild stranding in the adipose tissues around the inflamed portion of the pancreas. Spleen: Unremarkable Adrenals/Urinary Tract: No clinically significant findings. The median lobe of the prostate gland indents the bladder base. Stomach/Bowel:  Unremarkable Vascular/Lymphatic: Severely attenuated and borderline occluded splenic vein along the inflamed portion of the pancreas with gastric collateral vessels to the splenic vein. Atherosclerosis is present, including aortoiliac atherosclerotic disease. No pathologic adenopathy. Reproductive: Mild prostatomegaly. Other: No supplemental non-categorized findings. Musculoskeletal: Posterolateral rod and pedicle screw fixation with interbody fusion at L5-S1. Posterior decompression at L5-S1. IMPRESSION: 1. Enlarging region of hypoenhancement in the pancreatic tail at the site of prior mild hypoenhancement and inflammation. New bilobed cystic lesion centrally within this hypoenhancing region measuring 2.6 by 1.6 by 2.0 cm (volume = 4.4 cm^3). Although the appearance favors a pseudocyst over abscess, the possibility of underlying pancreatic adenocarcinoma with secondary inflammation is difficult to totally exclude. Assuming typical clinical presentation and scenario for pancreatitis, follow up pancreatic protocol MRI with and without contrast is recommended in 2-3 months time to reassess the region and ensure expected improvement. If the clinical scenario is atypical for pancreatitis, then a more aggressive course for potential sampling of the pancreatic tail should be considered. 2. 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 3. Borderline intrahepatic biliary dilatation. 4. Mild prostatomegaly. 5. Prior fusion at L5-S1. 6. Aortic atherosclerosis. Aortic Atherosclerosis (ICD10-I70.0). Electronically Signed   By: Gaylyn Rong M.D.   On: 07/02/2022 18:05    Assessment:   # Pancreatic Tail Lesion- noted on imaging - multilocular pancreatic tail lesion measuring 4.4 x 3.1 x 2.8 cm with pancreatic main duct dilation associated and parenchymal atrophy - CA19-9 pending - Suspicious for malignancy  given other clinical findings, however, may be IPMN given recent pancreatitis episode in active tobacco use and h/o etoh use (none in 20 years)   # Chronic Pancreatitis- possible acute episode - potentially had Cullen sign on previous episode. No flank or other bruising. 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 Have contacted patient navigator Oncology has been consulted by primary team  CA19-9 pending  No signs of GIB- no melena/hematochezia/hematemesis/coffee ground emesis. BUN/Cr ratio wnl. 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 Informed consent was obtained from the patient/patient representative after explaining the indication, nature, and risks of the procedure including but not limited to death, bleeding, perforation, missed neoplasm/lesions, cardiorespiratory compromise, and reaction to medications. Opportunity for questions was given and appropriate answers were provided. Patient/patient representative has verbalized understanding is amenable to undergoing the procedure.  I personally performed the service.  Management of other medical comorbidities as  per primary team  Thank you for allowing [place] to participate in this patient's care. Please don't hesitate to call if any questions or concerns arise.   [name], DO [facility]  Portions of the record may have been created with voice recognition software. Occasional wrong-word or 'sound-a-like' substitutions may have occurred due to the inherent limitations of voice recognition software.  Read the chart carefully and recognize, using context, where substitutions may have occurred.

## 2022-07-04 NOTE — Plan of Care (Signed)
Pt A&Ox4 Afebrile on RA with no respiratory distress noted. PO meds taken whole with no issues noted. IV clean, dry and intact.  Oxycodone given for pain management. Pt OOB to  bathroom/chair this shift. All safety precautions maintained, bed in low position, call bell within reach, bed in low position. Pt resting in bed at this time. Will continue with plan of care.   Problem: Education: Goal: Knowledge of General Education information will improve Description: Including pain rating scale, medication(s)/side effects and non-pharmacologic comfort measures Outcome: Progressing   Problem: Health Behavior/Discharge Planning: Goal: Ability to manage health-related needs will improve Outcome: Progressing   Problem: Clinical Measurements: Goal: Ability to maintain clinical measurements within normal limits will improve Outcome: Progressing Goal: Will remain free from infection Outcome: Progressing Goal: Diagnostic test results will improve Outcome: Progressing Goal: Respiratory complications will improve Outcome: Progressing Goal: Cardiovascular complication will be avoided Outcome: Progressing   Problem: Activity: Goal: Risk for activity intolerance will decrease Outcome: Progressing   Problem: Nutrition: Goal: Adequate nutrition will be maintained Outcome: Progressing   Problem: Coping: Goal: Level of anxiety will decrease Outcome: Progressing   Problem: Elimination: Goal: Will not experience complications related to bowel motility Outcome: Progressing Goal: Will not experience complications related to urinary retention Outcome: Progressing   Problem: Pain Managment: Goal: General experience of comfort will improve Outcome: Progressing   Problem: Safety: Goal: Ability to remain free from injury will improve Outcome: Progressing   Problem: Skin Integrity: Goal: Risk for impaired skin integrity will decrease Outcome: Progressing

## 2022-07-04 NOTE — Consult Note (Signed)
[facility]  [phone] [phone]  ID: David Harrell OB: 09/12/1960  MR#: [id number]  AOZ#:[id number]  Patient Care Team: Gracelyn Nurse, MD as PCP - General (Internal Medicine)  CHIEF COMPLAINT: Pancreatic tail mass  INTERVAL HISTORY: Patient is a 62 year old male who was admitted to the hospital with intractable abdominal/left flank pain.  Subsequent imaging revealed a 4.4 cm multifocal mass lesion at the tail of the pancreas that was not evident on previous abdominal imaging in January 2024.  Patient has a poor appetite and admits to unintentional weight loss as well.  He he has no neurologic complaints.  He denies any recent fevers or illnesses.  He has no chest pain, shortness of breath, cough, or hemoptysis.  He denies any nausea, vomiting, constipation, or diarrhea.  He has no urinary complaints.  Patient offers no further specific complaints today.  REVIEW OF SYSTEMS:   Review of Systems  Constitutional:  Positive for weight loss. Negative for fever and malaise/fatigue.  Respiratory: Negative.  Negative for cough and shortness of breath.   Cardiovascular: Negative.  Negative for chest pain and leg swelling.  Gastrointestinal:  Positive for abdominal pain. Negative for nausea.  Genitourinary:  Positive for flank pain.  Musculoskeletal:  Negative for back pain.  Skin: Negative.   Neurological: Negative.  Negative for dizziness, focal weakness, weakness and headaches.  Psychiatric/Behavioral: Negative.  The patient is not nervous/anxious.     As per HPI. Otherwise, a complete review of systems is negative.  PAST MEDICAL HISTORY: Past Medical History:  Diagnosis Date   Arthritis    Hiatal hernia    Hypertension     PAST SURGICAL HISTORY: Past Surgical History:  Procedure Laterality Date   JOINT REPLACEMENT      FAMILY HISTORY: History reviewed. No pertinent family history.  ADVANCED DIRECTIVES (Y/N):  @ADVDIR @  HEALTH  MAINTENANCE: Social History   Tobacco Use   Smoking status: Every Day    Packs/day: 0.50    Years: 40.00    Additional pack years: 0.00    Total pack years: 20.00    Types: Cigarettes   Smokeless tobacco: Never  Substance Use Topics   Alcohol use: Not Currently   Drug use: Yes    Frequency: 1.0 times per week    Types: Marijuana     Colonoscopy:  PAP:  Bone density:  Lipid panel:  Allergies  Allergen Reactions   Aspirin    Penicillins    Tape    Vicodin [Hydrocodone-Acetaminophen]     Current Facility-Administered Medications  Medication Dose Route Frequency Provider Last Rate Last Admin   ALPRAZolam (XANAX) tablet 1 mg  1 mg Oral Q12H PRN Gertha Calkin, MD   1 mg at 07/04/22 0546   heparin injection 5,000 Units  5,000 Units Subcutaneous Q8H Gertha Calkin, MD   5,000 Units at 07/04/22 0546   lactated ringers infusion   Intravenous Continuous Gertha Calkin, MD 100 mL/hr at 07/04/22 0545 New Bag at 07/04/22 0545   lisinopril (ZESTRIL) tablet 20 mg  20 mg Oral Daily Rosezetta Schlatter T, MD   20 mg at 07/04/22 0840   morphine (PF) 2 MG/ML injection 2 mg  2 mg Intravenous Q4H PRN Gertha Calkin, MD   2 mg at 07/04/22 0546   nicotine (NICODERM CQ - dosed in mg/24 hours) patch 21 mg  21 mg Transdermal Daily Gertha Calkin, MD   21 mg at 07/04/22 0841   ondansetron (ZOFRAN) tablet 4  mg  4 mg Oral Q6H PRN Gertha Calkin, MD       Or   ondansetron [facility]) injection 4 mg  4 mg Intravenous Q6H PRN Gertha Calkin, MD   4 mg at 07/03/22 0422   oxyCODONE (Oxy IR/ROXICODONE) immediate release tablet 10 mg  10 mg Oral Q4H PRN Rosezetta Schlatter T, MD   10 mg at 07/04/22 1011   pantoprazole (PROTONIX) injection 40 mg  40 mg Intravenous Q12H Irena Cords V, MD   40 mg at 07/04/22 0840   rosuvastatin (CRESTOR) tablet 20 mg  20 mg Oral Daily Irena Cords V, MD   20 mg at 07/04/22 0840   sodium chloride flush (NS) 0.9 % injection 3 mL  3 mL Intravenous Q12H Irena Cords V, MD   3 mL at 07/04/22 0841     OBJECTIVE: Vitals:   07/04/22 0810 07/04/22 0810  BP: (!) 141/77 (!) 141/77  Pulse: (!) 52 (!) 52  Resp: 14 14  Temp: 97.8 F (36.6 C) 97.8 F (36.6 C)  SpO2: 99% 99%     Body mass index is 22.05 kg/m.    ECOG FS:1 - Symptomatic but completely ambulatory  General: Well-developed, well-nourished, no acute distress. Eyes: Pink conjunctiva, anicteric sclera. HEENT: Normocephalic, moist mucous membranes. Lungs: No audible wheezing or coughing. Heart: Regular rate and rhythm. Abdomen: Soft, nontender, no obvious distention. Musculoskeletal: No edema, cyanosis, or clubbing. Neuro: Alert, answering all questions appropriately. Cranial nerves grossly intact. Skin: No rashes or petechiae noted. Psych: Normal affect. Lymphatics: No cervical, calvicular, axillary or inguinal LAD.   LAB RESULTS:  Lab Results  Component Value Date   NA 140 07/04/2022   K 3.7 07/04/2022   CL 108 07/04/2022   CO2 27 07/04/2022   GLUCOSE 107 (H) 07/04/2022   BUN 6 07/04/2022   CREATININE 0.77 07/04/2022   CALCIUM 8.6 (L) 07/04/2022   PROT 6.4 (L) 07/03/2022   ALBUMIN 3.5 07/03/2022   AST 14 (L) 07/03/2022   ALT 15 07/03/2022   ALKPHOS 51 07/03/2022   BILITOT 0.6 07/03/2022   GFRNONAA >60 07/04/2022   GFRAA >60 04/13/2018    Lab Results  Component Value Date   WBC 5.8 07/04/2022   NEUTROABS 2.5 07/04/2022   HGB 12.4 (L) 07/04/2022   HCT 37.8 (L) 07/04/2022   MCV 88.5 07/04/2022   PLT 198 07/04/2022     STUDIES: MR ABDOMEN W WO CONTRAST  Result Date: 07/03/2022 CLINICAL DATA:  Pancreatic tail lesion on CT scan earlier same day. EXAM: MRI ABDOMEN WITHOUT AND WITH CONTRAST TECHNIQUE: Multiplanar multisequence MR imaging of the abdomen was performed both before and after the administration of intravenous contrast. CONTRAST:  7mL GADAVIST GADOBUTROL 1 MMOL/ML IV SOLN COMPARISON:  CT scan from earlier same day. FINDINGS: Lower chest: Unremarkable. Hepatobiliary: No suspicious focal  abnormality in the liver parenchyma. Mild intrahepatic biliary duct prominence. No evidence for gallstones. No common bile duct dilatation (5 mm diameter in the head of the pancreas. No evidence for choledocholithiasis Pancreas: 4.4 x 3.1 x 2.8 cm multilocular mass lesion is identified in the tail of pancreas. There is atrophy in the tip of the pancreas with associated main duct dilatation. After IV contrast administration there is enhancement in the periphery of the lesion with enhancing smooth internal septations. Spleen:  No splenomegaly. No focal mass lesion. Adrenals/Urinary Tract: No adrenal nodule or mass. Right kidney unremarkable. 1.7 cm simple cyst noted upper pole left kidney. No followup imaging is recommended. Stomach/Bowel: Stomach  is unremarkable. No gastric wall thickening. No evidence of outlet obstruction. Duodenum is normally positioned as is the ligament of Treitz. No small bowel or colonic dilatation within the visualized abdomen. Vascular/Lymphatic: No abdominal aortic aneurysm. No abdominal lymphadenopathy. 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 Other:  No intraperitoneal free fluid. Musculoskeletal: No focal suspicious marrow enhancement within the visualized bony anatomy. Artifact from lower lumbar fixation hardware evident. IMPRESSION: 1. 4.4 x 3.1 x 2.8 cm multilocular mass lesion in the tail of the pancreas with associated main duct dilatation and parenchymal atrophy in the tip of the pancreatic tail. Mucinous cystic neoplasm, IPMN, and pseudocyst could all have this appearance although mucinous cystic neoplasm favored based on imaging. Endoscopic ultrasound recommended to further evaluate. 2. 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 3. Mild intrahepatic biliary duct dilatation. No common bile duct dilatation. No evidence for choledocholithiasis. Electronically Signed   By: Kennith Center M.D.   On: 07/03/2022 08:47   MR 3D Recon At Scanner  Result Date: 07/03/2022 CLINICAL DATA:  Pancreatic  tail lesion on CT scan earlier same day. EXAM: MRI ABDOMEN WITHOUT AND WITH CONTRAST TECHNIQUE: Multiplanar multisequence MR imaging of the abdomen was performed both before and after the administration of intravenous contrast. CONTRAST:  7mL GADAVIST GADOBUTROL 1 MMOL/ML IV SOLN COMPARISON:  CT scan from earlier same day. FINDINGS: Lower chest: Unremarkable. Hepatobiliary: No suspicious focal abnormality in the liver parenchyma. Mild intrahepatic biliary duct prominence. No evidence for gallstones. No common bile duct dilatation (5 mm diameter in the head of the pancreas. No evidence for choledocholithiasis Pancreas: 4.4 x 3.1 x 2.8 cm multilocular mass lesion is identified in the tail of pancreas. There is atrophy in the tip of the pancreas with associated main duct dilatation. After IV contrast administration there is enhancement in the periphery of the lesion with enhancing smooth internal septations. Spleen:  No splenomegaly. No focal mass lesion. Adrenals/Urinary Tract: No adrenal nodule or mass. Right kidney unremarkable. 1.7 cm simple cyst noted upper pole left kidney. No followup imaging is recommended. Stomach/Bowel: Stomach is unremarkable. No gastric wall thickening. No evidence of outlet obstruction. Duodenum is normally positioned as is the ligament of Treitz. No small bowel or colonic dilatation within the visualized abdomen. Vascular/Lymphatic: No abdominal aortic aneurysm. No abdominal lymphadenopathy. 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 Other:  No intraperitoneal free fluid. Musculoskeletal: No focal suspicious marrow enhancement within the visualized bony anatomy. Artifact from lower lumbar fixation hardware evident. IMPRESSION: 1. 4.4 x 3.1 x 2.8 cm multilocular mass lesion in the tail of the pancreas with associated main duct dilatation and parenchymal atrophy in the tip of the pancreatic tail. Mucinous cystic neoplasm, IPMN, and pseudocyst could all have this appearance although mucinous cystic  neoplasm favored based on imaging. Endoscopic ultrasound recommended to further evaluate. 2. 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 3. Mild intrahepatic biliary duct dilatation. No common bile duct dilatation. No evidence for choledocholithiasis. Electronically Signed   By: Kennith Center M.D.   On: 07/03/2022 08:47   CT ABDOMEN PELVIS W CONTRAST  Result Date: 07/02/2022 CLINICAL DATA:  Left-sided abdominal pain, suspected pancreatitis. EXAM: CT ABDOMEN AND PELVIS WITH CONTRAST TECHNIQUE: Multidetector CT imaging of the abdomen and pelvis was performed using the standard protocol following bolus administration of intravenous contrast. RADIATION DOSE REDUCTION: This exam was performed according to the departmental dose-optimization program which includes automated exposure control, adjustment of the mA and/or kV according to patient size and/or use of iterative reconstruction technique. CONTRAST:  OMNIPAQUE IOHEXOL 300 MG/ML  SOLN COMPARISON:  04/07/2022 FINDINGS: Lower chest: Unremarkable Hepatobiliary: Stable 6 mm cyst in segment 2 of the liver. Borderline intrahepatic biliary dilatation. Gallbladder unremarkable. No extrahepatic biliary dilatation. Pancreas: In the pancreatic tail the site of prior mild hypoenhancement and inflammation, there is a larger region of hypoenhancement measuring 3.8 by 3.0 cm on image 18 series 2, and faintly reduced enhancement further distally in the pancreatic tail is well. Moreover, there is a bilobed cystic lesion within the pancreas measuring 2.6 by 1.6 by 2.0 cm. This cystic lesion does not have substantial peripheral enhancement and accordingly I favor a pseudocyst over abscess. In this setting, underlying pancreatic adenocarcinoma is difficult to totally exclude. Although a substantial portion of the pancreatic tail is hypoenhancing, we do not demonstrate lack of enhancement to suggest pancreatic necrosis. Pancreatic body and head appear normal. Mild stranding in the  adipose tissues around the inflamed portion of the pancreas. Spleen: Unremarkable Adrenals/Urinary Tract: No clinically significant findings. The median lobe of the prostate gland indents the bladder base. 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 Atherosclerosis is present, including aortoiliac atherosclerotic disease. No pathologic adenopathy. Reproductive: Mild prostatomegaly. Other: No supplemental non-categorized findings. Musculoskeletal: Posterolateral rod and pedicle screw fixation with interbody fusion at L5-S1. Posterior decompression at L5-S1. IMPRESSION: 1. Enlarging region of hypoenhancement in the pancreatic tail at the site of prior mild hypoenhancement and inflammation. New bilobed cystic lesion centrally within this hypoenhancing region measuring 2.6 by 1.6 by 2.0 cm (volume = 4.4 cm^3). Although the appearance favors a pseudocyst over abscess, the possibility of underlying pancreatic adenocarcinoma with secondary inflammation is difficult to totally exclude. Assuming typical clinical presentation and scenario for pancreatitis, follow up pancreatic protocol MRI with and without contrast is recommended in 2-3 months time to reassess the region and ensure expected improvement. If the clinical scenario is atypical for pancreatitis, then a more aggressive course for potential sampling of the pancreatic tail should be considered. 2. 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 3. Borderline intrahepatic biliary dilatation. 4. Mild prostatomegaly. 5. Prior fusion at L5-S1. 6. Aortic atherosclerosis. Aortic Atherosclerosis (ICD10-I70.0). Electronically Signed   By: Gaylyn Rong M.D.   On: 07/02/2022 18:05    ASSESSMENT: Pancreatic tail  mass.  PLAN:    Pancreatic tail mass: MRI results from July 03, 2018, reviewed independently and report as above with a 4.4 cm multifocally mass lesion at the tail the pancreas highly suspicious for underlying malignancy.  No other disease is noted on imaging.  CT scan from April 07, 2022 did not note any pancreatic mass.  CA 19-9 is pending at time of dictation.  Appreciate GI input with plans for EGD tomorrow and then EUS for biopsy upon discharge.  Patient can follow-up in the cancer center after his EUS to discuss his final pathology results and treatment planning if necessary. Abdominal pain: Likely secondary to pancreatitis/pancreatic mass.  Continue current narcotic regimen. Anemia mild, monitor.  Appreciate consult, will follow.   Jeralyn Ruths, MD   07/04/2022 12:46 PM

## 2022-07-04 NOTE — Progress Notes (Signed)
 Inpatient Follow-up/Progress Note   Patient ID: David Harrell is a 62 y.o. male.  Overnight Events / Subjective Findings NAEON. Still with some abdominal comfortable. Tolerating liquid diet. No other acute GI complaints.  Review of Systems  Constitutional:  Negative for activity change, appetite change, chills, diaphoresis, fatigue, fever and unexpected weight change.  HENT:  Negative for trouble swallowing and voice change.   Respiratory:  Negative for shortness of breath and wheezing.   Cardiovascular:  Negative for chest pain, palpitations and leg swelling.  Gastrointestinal:  Positive for abdominal pain (mild). Negative for abdominal distention, anal bleeding, blood in stool, constipation, diarrhea, nausea and vomiting.  Musculoskeletal:  Negative for arthralgias and myalgias.  Skin:  Negative for color change and pallor.  Neurological:  Negative for dizziness, syncope and weakness.  Psychiatric/Behavioral:  Negative for confusion. The patient is not nervous/anxious.   All other systems reviewed and are negative.    Medications  Current Facility-Administered Medications:    ALPRAZolam (XANAX) tablet 1 mg, 1 mg, Oral, Q12H PRN, Patel, Ekta V, MD, 1 mg at 07/04/22 0546   heparin injection 5,000 Units, 5,000 Units, Subcutaneous, Q8H, Patel, Ekta V, MD, 5,000 Units at 07/04/22 0546   lactated ringers infusion, , Intravenous, Continuous, Patel, Ekta V, MD, Last Rate: 100 mL/hr at 07/04/22 0545, New Bag at 07/04/22 0545   lisinopril (ZESTRIL) tablet 20 mg, 20 mg, Oral, Daily, Djan, Prince T, MD, 20 mg at 07/04/22 0840   morphine (PF) 2 MG/ML injection 2 mg, 2 mg, Intravenous, Q4H PRN, Patel, Ekta V, MD, 2 mg at 07/04/22 0546   nicotine (NICODERM CQ - dosed in mg/24 hours) patch 21 mg, 21 mg, Transdermal, Daily, Patel, Ekta V, MD, 21 mg at 07/04/22 0841   ondansetron (ZOFRAN) tablet 4 mg, 4 mg, Oral, Q6H PRN **OR** ondansetron (ZOFRAN) injection 4 mg, 4 mg, Intravenous, Q6H PRN, Patel,  Ekta V, MD, 4 mg at 07/03/22 0422   oxyCODONE (Oxy IR/ROXICODONE) immediate release tablet 10 mg, 10 mg, Oral, Q4H PRN, Djan, Prince T, MD, 10 mg at 07/04/22 1011   pantoprazole (PROTONIX) injection 40 mg, 40 mg, Intravenous, Q12H, Patel, Ekta V, MD, 40 mg at 07/04/22 0840   rosuvastatin (CRESTOR) tablet 20 mg, 20 mg, Oral, Daily, Patel, Ekta V, MD, 20 mg at 07/04/22 0840   sodium chloride flush (NS) 0.9 % injection 3 mL, 3 mL, Intravenous, Q12H, Patel, Ekta V, MD, 3 mL at 07/04/22 0841  lactated ringers 100 mL/hr at 07/04/22 0545    ALPRAZolam, morphine injection, ondansetron **OR** ondansetron (ZOFRAN) IV, oxyCODONE   Objective    Vitals:   07/03/22 1920 07/04/22 0600 07/04/22 0810 07/04/22 0810  BP: 137/79 133/81 (!) 141/77 (!) 141/77  Pulse: (!) 59 60 (!) 52 (!) 52  Resp: 16 16 14 14  Temp: 98.2 F (36.8 C) 98.4 F (36.9 C) 97.8 F (36.6 C) 97.8 F (36.6 C)  TempSrc: Oral Oral Oral   SpO2: 100% 100% 99% 99%  Weight:      Height:         Physical Exam Vitals and nursing note reviewed.  Constitutional:      General: He is not in acute distress.    Appearance: Normal appearance. He is not ill-appearing, toxic-appearing or diaphoretic.  HENT:     Head: Normocephalic and atraumatic.     Nose: Nose normal.     Mouth/Throat:     Mouth: Mucous membranes are moist.     Pharynx: Oropharynx is clear.    Eyes:     General: No scleral icterus.    Extraocular Movements: Extraocular movements intact.  Cardiovascular:     Rate and Rhythm: Normal rate and regular rhythm.     Heart sounds: Normal heart sounds. No murmur heard.    No friction rub. No gallop.  Pulmonary:     Effort: Pulmonary effort is normal. No respiratory distress.     Breath sounds: Normal breath sounds. No wheezing, rhonchi or rales.  Abdominal:     General: Bowel sounds are normal. There is no distension.     Palpations: Abdomen is soft.     Tenderness: There is abdominal tenderness (mild llq). There is no  guarding or rebound.  Musculoskeletal:     Cervical back: Neck supple.     Right lower leg: No edema.     Left lower leg: No edema.  Skin:    General: Skin is warm and dry.     Coloration: Skin is not jaundiced or pale.  Neurological:     General: No focal deficit present.     Mental Status: He is alert and oriented to person, place, and time. Mental status is at baseline.  Psychiatric:        Mood and Affect: Mood normal.        Behavior: Behavior normal.        Thought Content: Thought content normal.        Judgment: Judgment normal.      Laboratory Data Recent Labs  Lab 07/02/22 1503 07/03/22 0533 07/04/22 0551  WBC 11.6* 6.1 5.8  HGB 15.2 12.3* 12.4*  HCT 46.0 37.4* 37.8*  PLT 298 200 198  NEUTOPHILPCT  --   --  43  LYMPHOPCT  --   --  46  MONOPCT  --   --  7  EOSPCT  --   --  3   Recent Labs  Lab 07/02/22 1503 07/03/22 0533 07/04/22 0551  NA 139 141 140  K 4.4 4.0 3.7  CL 103 110 108  CO2 25 26 27  BUN 9 8 6  CREATININE 0.87 0.76 0.77  CALCIUM 9.6 8.5* 8.6*  PROT 8.3* 6.4*  --   BILITOT 0.6 0.6  --   ALKPHOS 61 51  --   ALT 19 15  --   AST 23 14*  --   GLUCOSE 148* 105* 107*   No results for input(s): "INR" in the last 168 hours.    Imaging Studies: MR ABDOMEN W WO CONTRAST  Result Date: 07/03/2022 CLINICAL DATA:  Pancreatic tail lesion on CT scan earlier same day. EXAM: MRI ABDOMEN WITHOUT AND WITH CONTRAST TECHNIQUE: Multiplanar multisequence MR imaging of the abdomen was performed both before and after the administration of intravenous contrast. CONTRAST:  7mL GADAVIST GADOBUTROL 1 MMOL/ML IV SOLN COMPARISON:  CT scan from earlier same day. FINDINGS: Lower chest: Unremarkable. Hepatobiliary: No suspicious focal abnormality in the liver parenchyma. Mild intrahepatic biliary duct prominence. No evidence for gallstones. No common bile duct dilatation (5 mm diameter in the head of the pancreas. No evidence for choledocholithiasis Pancreas: 4.4 x 3.1 x  2.8 cm multilocular mass lesion is identified in the tail of pancreas. There is atrophy in the tip of the pancreas with associated main duct dilatation. After IV contrast administration there is enhancement in the periphery of the lesion with enhancing smooth internal septations. Spleen:  No splenomegaly. No focal mass lesion. Adrenals/Urinary Tract: No adrenal nodule or mass. Right kidney unremarkable. 1.7 cm simple   cyst noted upper pole left kidney. No followup imaging is recommended. Stomach/Bowel: Stomach is unremarkable. No gastric wall thickening. No evidence of outlet obstruction. Duodenum is normally positioned as is the ligament of Treitz. No small bowel or colonic dilatation within the visualized abdomen. Vascular/Lymphatic: No abdominal aortic aneurysm. No abdominal lymphadenopathy. 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 Other:  No intraperitoneal free fluid. Musculoskeletal: No focal suspicious marrow enhancement within the visualized bony anatomy. Artifact from lower lumbar fixation hardware evident. IMPRESSION: 1. 4.4 x 3.1 x 2.8 cm multilocular mass lesion in the tail of the pancreas with associated main duct dilatation and parenchymal atrophy in the tip of the pancreatic tail. Mucinous cystic neoplasm, IPMN, and pseudocyst could all have this appearance although mucinous cystic neoplasm favored based on imaging. Endoscopic ultrasound recommended to further evaluate. 2. 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 3. Mild intrahepatic biliary duct dilatation. No common bile duct dilatation. No evidence for choledocholithiasis. Electronically Signed   By: Eric  Mansell M.D.   On: 07/03/2022 08:47   MR 3D Recon At Scanner  Result Date: 07/03/2022 CLINICAL DATA:  Pancreatic tail lesion on CT scan earlier same day. EXAM: MRI ABDOMEN WITHOUT AND WITH CONTRAST TECHNIQUE: Multiplanar multisequence MR imaging of the abdomen was performed both before and after the administration of intravenous contrast. CONTRAST:   7mL GADAVIST GADOBUTROL 1 MMOL/ML IV SOLN COMPARISON:  CT scan from earlier same day. FINDINGS: Lower chest: Unremarkable. Hepatobiliary: No suspicious focal abnormality in the liver parenchyma. Mild intrahepatic biliary duct prominence. No evidence for gallstones. No common bile duct dilatation (5 mm diameter in the head of the pancreas. No evidence for choledocholithiasis Pancreas: 4.4 x 3.1 x 2.8 cm multilocular mass lesion is identified in the tail of pancreas. There is atrophy in the tip of the pancreas with associated main duct dilatation. After IV contrast administration there is enhancement in the periphery of the lesion with enhancing smooth internal septations. Spleen:  No splenomegaly. No focal mass lesion. Adrenals/Urinary Tract: No adrenal nodule or mass. Right kidney unremarkable. 1.7 cm simple cyst noted upper pole left kidney. No followup imaging is recommended. Stomach/Bowel: Stomach is unremarkable. No gastric wall thickening. No evidence of outlet obstruction. Duodenum is normally positioned as is the ligament of Treitz. No small bowel or colonic dilatation within the visualized abdomen. Vascular/Lymphatic: No abdominal aortic aneurysm. No abdominal lymphadenopathy. 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 Other:  No intraperitoneal free fluid. Musculoskeletal: No focal suspicious marrow enhancement within the visualized bony anatomy. Artifact from lower lumbar fixation hardware evident. IMPRESSION: 1. 4.4 x 3.1 x 2.8 cm multilocular mass lesion in the tail of the pancreas with associated main duct dilatation and parenchymal atrophy in the tip of the pancreatic tail. Mucinous cystic neoplasm, IPMN, and pseudocyst could all have this appearance although mucinous cystic neoplasm favored based on imaging. Endoscopic ultrasound recommended to further evaluate. 2. 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 3. Mild intrahepatic biliary duct dilatation. No common bile duct dilatation. No evidence for  choledocholithiasis. Electronically Signed   By: Eric  Mansell M.D.   On: 07/03/2022 08:47   CT ABDOMEN PELVIS W CONTRAST  Result Date: 07/02/2022 CLINICAL DATA:  Left-sided abdominal pain, suspected pancreatitis. EXAM: CT ABDOMEN AND PELVIS WITH CONTRAST TECHNIQUE: Multidetector CT imaging of the abdomen and pelvis was performed using the standard protocol following bolus administration of intravenous contrast. RADIATION DOSE REDUCTION: This exam was performed according to the departmental dose-optimization program which includes automated exposure control, adjustment of the mA and/or kV according to patient size and/or   use of iterative reconstruction technique. CONTRAST:  100mL OMNIPAQUE IOHEXOL 300 MG/ML  SOLN COMPARISON:  04/07/2022 FINDINGS: Lower chest: Unremarkable Hepatobiliary: Stable 6 mm cyst in segment 2 of the liver. Borderline intrahepatic biliary dilatation. Gallbladder unremarkable. No extrahepatic biliary dilatation. Pancreas: In the pancreatic tail the site of prior mild hypoenhancement and inflammation, there is a larger region of hypoenhancement measuring 3.8 by 3.0 cm on image 18 series 2, and faintly reduced enhancement further distally in the pancreatic tail is well. Moreover, there is a bilobed cystic lesion within the pancreas measuring 2.6 by 1.6 by 2.0 cm. This cystic lesion does not have substantial peripheral enhancement and accordingly I favor a pseudocyst over abscess. In this setting, underlying pancreatic adenocarcinoma is difficult to totally exclude. Although a substantial portion of the pancreatic tail is hypoenhancing, we do not demonstrate lack of enhancement to suggest pancreatic necrosis. Pancreatic body and head appear normal. Mild stranding in the adipose tissues around the inflamed portion of the pancreas. Spleen: Unremarkable Adrenals/Urinary Tract: No clinically significant findings. The median lobe of the prostate gland indents the bladder base. Stomach/Bowel:  Unremarkable Vascular/Lymphatic: Severely attenuated and borderline occluded splenic vein along the inflamed portion of the pancreas with gastric collateral vessels to the splenic vein. Atherosclerosis is present, including aortoiliac atherosclerotic disease. No pathologic adenopathy. Reproductive: Mild prostatomegaly. Other: No supplemental non-categorized findings. Musculoskeletal: Posterolateral rod and pedicle screw fixation with interbody fusion at L5-S1. Posterior decompression at L5-S1. IMPRESSION: 1. Enlarging region of hypoenhancement in the pancreatic tail at the site of prior mild hypoenhancement and inflammation. New bilobed cystic lesion centrally within this hypoenhancing region measuring 2.6 by 1.6 by 2.0 cm (volume = 4.4 cm^3). Although the appearance favors a pseudocyst over abscess, the possibility of underlying pancreatic adenocarcinoma with secondary inflammation is difficult to totally exclude. Assuming typical clinical presentation and scenario for pancreatitis, follow up pancreatic protocol MRI with and without contrast is recommended in 2-3 months time to reassess the region and ensure expected improvement. If the clinical scenario is atypical for pancreatitis, then a more aggressive course for potential sampling of the pancreatic tail should be considered. 2. 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 3. Borderline intrahepatic biliary dilatation. 4. Mild prostatomegaly. 5. Prior fusion at L5-S1. 6. Aortic atherosclerosis. Aortic Atherosclerosis (ICD10-I70.0). Electronically Signed   By: Walter  Liebkemann M.D.   On: 07/02/2022 18:05    Assessment:   # Pancreatic Tail Lesion- noted on imaging - multilocular pancreatic tail lesion measuring 4.4 x 3.1 x 2.8 cm with pancreatic main duct dilation associated and parenchymal atrophy - CA19-9 pending - Suspicious for malignancy  given other clinical findings, however, may be IPMN given recent pancreatitis episode in active tobacco use and h/o etoh use (none in 20 years)   # Chronic Pancreatitis- possible acute episode - potentially had Cullen sign on previous episode. No flank or other bruising. 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 Have contacted patient navigator Oncology has been consulted by primary team  CA19-9 pending  No signs of GIB- no melena/hematochezia/hematemesis/coffee ground emesis. BUN/Cr ratio wnl. 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 Informed consent was obtained from the patient/patient representative after explaining the indication, nature, and risks of the procedure including but not limited to death, bleeding, perforation, missed neoplasm/lesions, cardiorespiratory compromise, and reaction to medications. Opportunity for questions was given and appropriate answers were provided. Patient/patient representative has verbalized understanding is amenable to undergoing the procedure.  I personally performed the service.  Management of other medical comorbidities as  per primary team  Thank you for allowing us to participate in this patient's care. Please don't hesitate to call if any questions or concerns arise.   Isais Klipfel Michael Xochitl Egle, DO [facility]  Portions of the record may have been created with voice recognition software. Occasional wrong-word or 'sound-a-like' substitutions may have occurred due to the inherent limitations of voice recognition software.  Read the chart carefully and recognize, using context, where substitutions may have occurred.  

## 2022-07-05 ENCOUNTER — Inpatient Hospital Stay: Payer: Medicare HMO | Admitting: Anesthesiology

## 2022-07-05 ENCOUNTER — Encounter: Payer: Self-pay | Admitting: Internal Medicine

## 2022-07-05 ENCOUNTER — Encounter
Admission: EM | Disposition: A | Discharge status: Home / Self Care | Payer: Self-pay | Source: Home / Self Care | Attending: Internal Medicine

## 2022-07-05 DIAGNOSIS — K8689 Other specified diseases of pancreas: Secondary | ICD-10-CM | POA: Diagnosis not present

## 2022-07-05 HISTORY — PX: ESOPHAGOGASTRODUODENOSCOPY (EGD) WITH PROPOFOL: SHX5813

## 2022-07-05 LAB — CBC WITH DIFFERENTIAL/PLATELET
Abs Immature Granulocytes: 0.01 10*3/uL (ref 0.00–0.07)
Basophils Absolute: 0.1 10*3/uL (ref 0.0–0.1)
Basophils Relative: 1 %
Eosinophils Absolute: 0.2 10*3/uL (ref 0.0–0.5)
Eosinophils Relative: 3 %
HCT: 36.1 % — ABNORMAL LOW (ref 39.0–52.0)
Hemoglobin: 12.1 g/dL — ABNORMAL LOW (ref 13.0–17.0)
Immature Granulocytes: 0 %
Lymphocytes Relative: 45 %
Lymphs Abs: 2.9 10*3/uL (ref 0.7–4.0)
MCH: 29.4 pg (ref 26.0–34.0)
MCHC: 33.5 g/dL (ref 30.0–36.0)
MCV: 87.6 fL (ref 80.0–100.0)
Monocytes Absolute: 0.5 10*3/uL (ref 0.1–1.0)
Monocytes Relative: 7 %
Neutro Abs: 2.9 10*3/uL (ref 1.7–7.7)
Neutrophils Relative %: 44 %
Platelets: 183 10*3/uL (ref 150–400)
RBC: 4.12 MIL/uL — ABNORMAL LOW (ref 4.22–5.81)
RDW: 13.2 % (ref 11.5–15.5)
WBC: 6.4 10*3/uL (ref 4.0–10.5)
nRBC: 0 % (ref 0.0–0.2)

## 2022-07-05 LAB — BASIC METABOLIC PANEL
Anion gap: 5 (ref 5–15)
BUN: <5 mg/dL — ABNORMAL LOW (ref 6–20)
CO2: 30 mmol/L (ref 22–32)
Calcium: 8.6 mg/dL — ABNORMAL LOW (ref 8.9–10.3)
Chloride: 107 mmol/L (ref 98–111)
Creatinine, Ser: 0.8 mg/dL (ref 0.61–1.24)
GFR, Estimated: >60 mL/min (ref >60)
Glucose, Bld: 102 mg/dL — ABNORMAL HIGH (ref 70–99)
Potassium: 3.9 mmol/L (ref 3.5–5.1)
Sodium: 142 mmol/L (ref 135–145)

## 2022-07-05 SURGERY — ESOPHAGOGASTRODUODENOSCOPY (EGD) WITH PROPOFOL
Anesthesia: General

## 2022-07-05 MED ORDER — PROPOFOL 1000 MG/100ML IV EMUL
INTRAVENOUS | Status: AC
  Filled 2022-07-05: qty 100

## 2022-07-05 MED ORDER — DEXMEDETOMIDINE HCL IN NACL 80 MCG/20ML IV SOLN
INTRAVENOUS | Status: DC | PRN
  Administered 2022-07-05: 12 ug via INTRAVENOUS
  Administered 2022-07-05: 100 ug via INTRAVENOUS

## 2022-07-05 MED ORDER — GLYCOPYRROLATE 0.2 MG/ML IJ SOLN
INTRAMUSCULAR | Status: DC | PRN
  Administered 2022-07-05: .2 mg via INTRAVENOUS

## 2022-07-05 MED ORDER — SODIUM CHLORIDE 0.9 % IV SOLN: INTRAVENOUS | Status: DC

## 2022-07-05 MED ORDER — PROPOFOL 10 MG/ML IV BOLUS
INTRAVENOUS | Status: DC | PRN
  Administered 2022-07-05: 10 mg via INTRAVENOUS
  Administered 2022-07-05: 80 mg via INTRAVENOUS
  Administered 2022-07-05: 10 mg via INTRAVENOUS

## 2022-07-05 NOTE — Transfer of Care (Signed)
Immediate Anesthesia Transfer of Care Note  Patient: Linken Mcglothen [facility]  Procedure(s) Performed: ESOPHAGOGASTRODUODENOSCOPY (EGD) WITH PROPOFOL  Patient Location: PACU and Endoscopy Unit  Anesthesia Type:General  Level of Consciousness: awake  Airway & Oxygen Therapy: Patient Spontanous Breathing  Post-op Assessment: Report given to RN and Post -op Vital signs reviewed and stable  Post vital signs: Reviewed and stable  Last Vitals:  Vitals Value Taken Time  BP 145/71 07/05/22 1406  Temp 36.4 C 07/05/22 1406  Pulse 54 07/05/22 1407  Resp 14 07/05/22 1407  SpO2 100 % 07/05/22 1407  Vitals shown include unvalidated device data.  Last Pain:  Vitals:   07/05/22 1406  TempSrc: Temporal  PainSc: 0-No pain      Patients Stated Pain Goal: 0 (07/05/22 0417)  Complications: No notable events documented.

## 2022-07-05 NOTE — Plan of Care (Signed)

## 2022-07-05 NOTE — Progress Notes (Signed)
Progress Note   [name] QMV:[id number] DOB: May 31, 1960 DOA: 07/02/2022     3 DOS: the patient was seen and examined on 07/05/2022       Subjective:  Patient seen and examined at bedside this morning Still has some abdominal pain however improving Oncology and gastroenterologist on board Patient underwent endoscopic ultrasound evaluation and biopsy today   Brief hospital course: David Harrell is an 62 y.o. male seen in ed for abd pain and nausea. Pt states he has not drank in over 20 years. And the only medicine change he recalls is in [place] when got started in [place] and his first episode of pancreatitis was. Pt reports that he has bene told of seizures but does not have any seizure meds and has not had any seizures,. Pt has hardware in his neck and does not know if it is MRI compatible. D/W him results of pancreatic cyst nad need for MRI.      Pancreatic tail lesion  Abdominal pain secondary to acute pancreatitis as well as pancreatic tail mass Pt has normal lipase, came with abdominal pain ct imaging showing pancreatitis with pancreatic tail mass confirmed by MRI PRN pain control with morphine. MIVF.  We will advance patient's diet today if able to tolerate for potential discharge tomorrow Have consulted oncologist as well as gastroenterologist-we appreciate input Have reviewed the CT scan and MRI of the abdomen results and I have discussed this with the patient. Patient underwent endoscopic ultrasound evaluation and biopsy today  Pancreatitis Pt admitted with pancreatitis. 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  D/W GI Dr. [name] and nothing to do as it is chronic.  No bleeding or anemia.  IV PPI. MIVF.     Hyperglycemia A1c showing prediabetes     Hypertension Continue lisinopril     Current tobacco use Counseled and nicotine patch.        DVT prophylaxis:  Heparin   Code Status:  Full code       Physical  Exam: Physical Exam Vitals and nursing note reviewed.  Constitutional:      General: He is not in acute distress. HENT:     Head: Normocephalic and atraumatic.  Eyes:     Extraocular Movements: Extraocular movements intact.  Cardiovascular:     Rate and Rhythm: Normal rate and regular rhythm.  Pulmonary:     Effort: Pulmonary effort is normal.     Breath sounds: Normal breath sounds.  Abdominal:     General: Bowel sounds are normal. There is no distension.     Tenderness: There is abdominal tenderness in epigastric region. There is no guarding.  Musculoskeletal:     Right lower leg: No edema.     Left lower leg: No edema.  Skin:    General: Skin is warm.  Neurological:     General: No focal deficit present.     Data Reviewed: I have personally reviewed patient's laboratory results showing sodium 140 potassium 3.9   Disposition: Status is: Inpatient Continues to meet inpatient criteria given management for current abdominal pain due to acute pancreatitis as well as pancreatic mass needing several consultants input    Planned Discharge Destination: Home   Time spent: 45 minutes   Family communication: Wife present at bedside and updated     Vitals:   07/05/22 0423 07/05/22 0816 07/05/22 1257 07/05/22 1406  BP: 127/89 131/81 (!) 156/81 (!) 145/71  Pulse: (!) 56 (!) 56 (!) 58 (!) 54  Resp: 18  18 17   Temp: 97.7 F (36.5 C) 97.6 F (36.4 C) (!) 97.5 F (36.4 C) (!) 97.5 F (36.4 C)  TempSrc: Oral Oral Temporal Temporal  SpO2: 95% 98% 100% 100%  Weight:   63.5 kg   Height:   5\' 8"  (1.727 m)      Author: Loyce Dys, MD 07/05/2022 3:24 PM  For on call review [email or web address].

## 2022-07-05 NOTE — Anesthesia Postprocedure Evaluation (Signed)
Anesthesia Post Note  [name] [facility]  Procedure(s) Performed: ESOPHAGOGASTRODUODENOSCOPY (EGD) WITH PROPOFOL  Patient location during evaluation: PACU Anesthesia Type: General Level of consciousness: awake and alert, oriented and patient cooperative Pain management: pain level controlled Vital Signs Assessment: post-procedure vital signs reviewed and stable Respiratory status: spontaneous breathing, nonlabored ventilation and respiratory function stable Cardiovascular status: blood pressure returned to baseline and stable Postop Assessment: adequate PO intake Anesthetic complications: no   No notable events documented.   Last Vitals:  Vitals:   07/05/22 1257 07/05/22 1406  BP: (!) 156/81 (!) 145/71  Pulse: (!) 58 (!) 54  Resp: 18 17  Temp: (!) 36.4 C (!) 36.4 C  SpO2: 100% 100%    Last Pain:  Vitals:   07/05/22 1427  TempSrc:   PainSc: 0-No pain                 Reed Breech

## 2022-07-05 NOTE — Anesthesia Procedure Notes (Signed)
Procedure Name: MAC Date/Time: 07/05/2022 1:52 PM  Performed by: Cheral Bay, CRNAPre-anesthesia Checklist: Patient identified, Emergency Drugs available, Suction available, Patient being monitored and Timeout performed Patient Re-evaluated:Patient Re-evaluated prior to induction Oxygen Delivery Method: Simple face mask Induction Type: IV induction Placement Confirmation: positive ETCO2 and CO2 detector

## 2022-07-05 NOTE — Op Note (Signed)
[facility] Patient Name: David Harrell Procedure Date: 07/05/2022 1:45 PM MRN: [id number] Account #: [id number] Date of Birth: Aug 17, 1960 Admit Type: Inpatient Age: 62 Room: [facility] ENDO ROOM 2 Gender: Male Note Status: Finalized Instrument Name: Laurette Schimke [id number] Procedure:             Upper GI endoscopy Indications:           Exclusion of gastric varices Providers:             Trenda Moots, DO Referring MD:          Gracelyn Nurse, MD (Referring MD) Medicines:             Monitored Anesthesia Care Complications:         No immediate complications. Estimated blood loss:                         Minimal. Procedure:             Pre-Anesthesia Assessment:                        - Prior to the procedure, a History and Physical was                         performed, and patient medications and allergies were                         reviewed. The patient is competent. The risks and                         benefits of the procedure and the sedation options and                         risks were discussed with the patient. All questions                         were answered and informed consent was obtained.                         Patient identification and proposed procedure were                         verified by the physician, the nurse, the anesthetist                         and the technician in the endoscopy suite. Mental                         Status Examination: alert and oriented. Airway                         Examination: normal oropharyngeal airway and neck                         mobility. Respiratory Examination: clear to                         auscultation. CV Examination: RRR, no murmurs, no S3  or S4. Prophylactic Antibiotics: The patient does not                         require prophylactic antibiotics. Prior                         Anticoagulants: The patient has taken no anticoagulant                          or antiplatelet agents. ASA Grade Assessment: III - A                         patient with severe systemic disease. After reviewing                         the risks and benefits, the patient was deemed in                         satisfactory condition to undergo the procedure. The                         anesthesia plan was to use monitored anesthesia care                         (MAC). Immediately prior to administration of                         medications, the patient was re-assessed for adequacy                         to receive sedatives. The heart rate, respiratory                         rate, oxygen saturations, blood pressure, adequacy of                         pulmonary ventilation, and response to care were                         monitored throughout the procedure. The physical                         status of the patient was re-assessed after the                         procedure.                        After obtaining informed consent, the endoscope was                         passed under direct vision. Throughout the procedure,                         the patient's blood pressure, pulse, and oxygen                         saturations were monitored continuously. The Endoscope  was introduced through the mouth, and advanced to the                         second part of duodenum. The upper GI endoscopy was                         accomplished without difficulty. The patient tolerated                         the procedure well. Findings:      The duodenal bulb, first portion of the duodenum and second portion of       the duodenum were normal. Estimated blood loss: none.      The entire examined stomach was normal. No varices, GAVE, portal       gastropathy appreciated. Estimated blood loss: none.      The Z-line was irregular. Biopsies were taken with a cold forceps for       histology. Estimated blood loss was minimal.       Esophagogastric landmarks were identified: the gastroesophageal junction       was found at 40 cm from the incisors.      The examined esophagus was normal. no signs of esophageal varices       Estimated blood loss: none. Impression:            - Normal duodenal bulb, first portion of the duodenum                         and second portion of the duodenum.                        - Normal stomach.                        - Z-line irregular. Biopsied.                        - Esophagogastric landmarks identified.                        - Normal esophagus. Recommendation:        - Patient has a contact number available for                         emergencies. The signs and symptoms of potential                         delayed complications were discussed with the patient.                         Return to normal activities tomorrow. Written                         discharge instructions were provided to the patient.                        - Advance diet as tolerated to low fat.                        - Return patient to hospital ward for ongoing care.                        -  Continue present medications.                        - Await pathology results.                        - Return to GI clinic as previously scheduled.                        - Refer to advanced endoscopy for endoscopic                         ultrasound at appointment to be scheduled.                        - The findings and recommendations were discussed with                         the patient. Procedure Code(s):     --- Professional ---                        [phone], Esophagogastroduodenoscopy, flexible,                         transoral; with biopsy, single or multiple Diagnosis Code(s):     --- Professional ---                        K22.89, Other specified disease of esophagus CPT copyright 2022 American Medical Association. All rights reserved. The codes documented in this report are preliminary and upon coder  review may  be revised to meet current compliance requirements. Attending Participation:      I personally performed the entire procedure. Elfredia Nevins, DO [name] DO, DO 07/05/2022 2:08:33 PM This report has been signed electronically. Number of Addenda: 0 Note Initiated On: 07/05/2022 1:45 PM Estimated Blood Loss:  Estimated blood loss was minimal.      [facility]

## 2022-07-05 NOTE — Care Plan (Signed)
Brief GI Post op note  Pt seen in his room with his wife and sister post procedure All parties updated about results  No signs of varices or portal htn noted Nurse navigator arranging EUS Will contact patient for outpatient office follow up. 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 Available as needed. Please do not hesitate to call regarding questions or concerns.  Enis Slipper, DO [facility]

## 2022-07-05 NOTE — Anesthesia Preprocedure Evaluation (Signed)
Anesthesia Evaluation  Patient identified by MRN, date of birth, ID band Patient awake    Reviewed: Allergy & Precautions, NPO status , Patient's Chart, lab work & pertinent test results  History of Anesthesia Complications Negative for: history of anesthetic complications  Airway Mallampati: I   Neck ROM: Full    Dental  (+) Poor Dentition, Missing, Chipped, Loose   Pulmonary Current Smoker (1/2 ppd) and Patient abstained from smoking.   Pulmonary exam normal breath sounds clear to auscultation       Cardiovascular hypertension, Normal cardiovascular exam Rhythm:Regular Rate:Normal     Neuro/Psych Occasional marijuana use; chronic back pain    GI/Hepatic hiatal hernia,GERD  ,,  Endo/Other  negative endocrine ROS    Renal/GU negative Renal ROS     Musculoskeletal   Abdominal   Peds  Hematology negative hematology ROS (+)   Anesthesia Other Findings   Reproductive/Obstetrics                             Anesthesia Physical Anesthesia Plan  ASA: 3  Anesthesia Plan: General   Post-op Pain Management:    Induction: Intravenous  PONV Risk Score and Plan: 1 and Propofol infusion, TIVA and Treatment may vary due to age or medical condition  Airway Management Planned: Natural Airway  Additional Equipment:   Intra-op Plan:   Post-operative Plan:   Informed Consent: I have reviewed the patients History and Physical, chart, labs and discussed the procedure including the risks, benefits and alternatives for the proposed anesthesia with the patient or authorized representative who has indicated his/her understanding and acceptance.       Plan Discussed with: CRNA  Anesthesia Plan Comments: (LMA/GETA backup discussed.  Patient consented for risks of anesthesia including but not limited to:  - adverse reactions to medications - damage to eyes, teeth, lips or other oral mucosa -  nerve damage due to positioning  - sore throat or hoarseness - damage to heart, brain, nerves, lungs, other parts of body or loss of life  Informed patient about role of CRNA in peri- and intra-operative care.  Patient voiced understanding.)       Anesthesia Quick Evaluation

## 2022-07-05 NOTE — Interval H&P Note (Signed)
History and Physical Interval Note: Progress note from 07/04/22  was reviewed and there was no interval change after seeing and examining the patient.  Morning labs were reviewed. Abdominal discomfort unchanged. NPO since midnight. Written consent was obtained from the patient after discussion of risks, benefits, and alternatives. Patient has consented to proceed with Esophagogastroduodenoscopy with possible intervention  Esophagogastroduodenoscopy with possible biopsy, control of bleeding, polypectomy, and interventions as necessary has been discussed with the patient/patient representative. Informed consent was obtained from the patient/patient representative after explaining the indication, nature, and risks of the procedure including but not limited to death, bleeding, perforation, missed neoplasm/lesions, cardiorespiratory compromise, and reaction to medications. Opportunity for questions was given and appropriate answers were provided. Patient/patient representative has verbalized understanding is amenable to undergoing the procedure.    07/05/2022 1:47 PM  David Harrell  has presented today for surgery, with the diagnosis of splenic vein thrombosis; rule out varices.  The various methods of treatment have been discussed with the patient and family. After consideration of risks, benefits and other options for treatment, the patient has consented to  Procedure(s): ESOPHAGOGASTRODUODENOSCOPY (EGD) WITH PROPOFOL (N/A) as a surgical intervention.  The patient's history has been reviewed, patient examined, no change in status, stable for surgery.  I have reviewed the patient's chart and labs.  Questions were answered to the patient's satisfaction.     [name]

## 2022-07-05 NOTE — Care Management Important Message (Signed)
Important Message  Patient Details  Name: David Harrell MRN: [id number] Date of Birth: 08/23/1960   Medicare Important Message Given:  Yes     Johnell Comings 07/05/2022, 11:48 AM

## 2022-07-06 ENCOUNTER — Encounter: Payer: Self-pay | Admitting: Gastroenterology

## 2022-07-06 DIAGNOSIS — K8689 Other specified diseases of pancreas: Secondary | ICD-10-CM | POA: Diagnosis not present

## 2022-07-06 LAB — CBC WITH DIFFERENTIAL/PLATELET
Abs Immature Granulocytes: 0.03 10*3/uL (ref 0.00–0.07)
Basophils Absolute: 0.1 10*3/uL (ref 0.0–0.1)
Basophils Relative: 1 %
Eosinophils Absolute: 0.2 10*3/uL (ref 0.0–0.5)
Eosinophils Relative: 3 %
HCT: 37.8 % — ABNORMAL LOW (ref 39.0–52.0)
Hemoglobin: 12.4 g/dL — ABNORMAL LOW (ref 13.0–17.0)
Immature Granulocytes: 0 %
Lymphocytes Relative: 42 %
Lymphs Abs: 2.8 10*3/uL (ref 0.7–4.0)
MCH: 29.2 pg (ref 26.0–34.0)
MCHC: 32.8 g/dL (ref 30.0–36.0)
MCV: 88.9 fL (ref 80.0–100.0)
Monocytes Absolute: 0.5 10*3/uL (ref 0.1–1.0)
Monocytes Relative: 7 %
Neutro Abs: 3.2 10*3/uL (ref 1.7–7.7)
Neutrophils Relative %: 47 %
Platelets: 181 10*3/uL (ref 150–400)
RBC: 4.25 MIL/uL (ref 4.22–5.81)
RDW: 13.1 % (ref 11.5–15.5)
WBC: 6.8 10*3/uL (ref 4.0–10.5)
nRBC: 0 % (ref 0.0–0.2)

## 2022-07-06 LAB — BASIC METABOLIC PANEL
Anion gap: 6 (ref 5–15)
BUN: 5 mg/dL — ABNORMAL LOW (ref 6–20)
CO2: 28 mmol/L (ref 22–32)
Calcium: 8.6 mg/dL — ABNORMAL LOW (ref 8.9–10.3)
Chloride: 108 mmol/L (ref 98–111)
Creatinine, Ser: 0.77 mg/dL (ref 0.61–1.24)
GFR, Estimated: >60 mL/min (ref >60)
Glucose, Bld: 110 mg/dL — ABNORMAL HIGH (ref 70–99)
Potassium: 3.8 mmol/L (ref 3.5–5.1)
Sodium: 142 mmol/L (ref 135–145)

## 2022-07-06 MED ORDER — PANTOPRAZOLE SODIUM 40 MG PO TBEC
40.0000 mg | DELAYED_RELEASE_TABLET | Freq: Two times a day (BID) | ORAL | Status: DC
  Administered 2022-07-06 – 2022-07-07 (×2): 40 mg via ORAL
  Filled 2022-07-06 (×2): qty 1

## 2022-07-06 MED ORDER — ORAL CARE MOUTH RINSE: 15.0000 mL | OROMUCOSAL | Status: DC | PRN

## 2022-07-06 NOTE — Progress Notes (Signed)
  Transition of Care [facility]) Screening Note   Patient Details  Name: David Harrell Date of Birth: [birth year]   Transition of Care [facility]) CM/SW Contact:    Chapman Fitch, RN Phone Number: 07/06/2022, 10:14 AM    Transition of Care Department [facility]) has reviewed patient and no TOC needs have been identified at this time. We will continue to monitor patient advancement through interdisciplinary progression rounds. If new patient transition needs arise, please place a TOC consult.

## 2022-07-06 NOTE — Progress Notes (Addendum)
Progress Note   [name] ZOX:[id number] DOB: 15-Jan-1961 DOA: 07/02/2022     4 DOS: the patient was seen and examined on 07/06/2022   Subjective:  Patient seen and examined at bedside this morning Still has some abdominal pain however improving Tells me his abdominal pain gets worse with meals Have told him to avoid fatty meals as much as possible to relieve the pressure from his pancreas Oncology and gastroenterologist on board Patient underwent upper GI endoscopy and biopsy on 07/05/2022   Brief hospital course: David Harrell is an 62 y.o. male seen in ed for abd pain and nausea. Pt states he has not drank in over 20 years. And the only medicine change he recalls is in [place] when got started in [place] and his first episode of pancreatitis was. Pt reports that he has bene told of seizures but does not have any seizure meds and has not had any seizures,.  Patient underwent MRI of the abdomen with findings of pancreatic tail mass.   Assessment and plan:   Abdominal pain secondary to acute pancreatitis as well as pancreatic tail mass Pancreatic tail mass Pt has normal lipase, came with abdominal pain ct imaging showing pancreatitis with pancreatic tail mass confirmed by MRI PRN pain control with morphine. MIVF.  We will advance patient's diet today if able to tolerate for potential discharge tomorrow Have consulted oncologist as well as gastroenterologist-we appreciate input Have reviewed the CT scan and MRI of the abdomen results and I have discussed this with the patient. Patient underwent upper GI endoscopy and biopsy on 07/05/2022 No biopsy have been taken from the pancreas yet, patient will need an outpatient EUS for this.   Pancreatitis Pt admitted with pancreatitis. 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  D/W GI Dr. [name] and nothing to do as it is chronic.  No bleeding or anemia.  Continue PPI MIVF. 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  Hyperglycemia A1c showing prediabetes     Hypertension Continue lisinopril     Current tobacco use Counseled and nicotine patch.     DVT prophylaxis:  Heparin   Code Status:  Full code       Physical Exam: Physical Exam Vitals and nursing note reviewed.  Constitutional:      General: He is not in acute distress. HENT:     Head: Normocephalic and atraumatic.  Eyes:     Extraocular Movements: Extraocular movements intact.  Cardiovascular:     Rate and Rhythm: Normal rate and regular rhythm.  Pulmonary:     Effort: Pulmonary effort is normal.     Breath sounds: Normal breath sounds.  Abdominal:     General: Bowel sounds are normal. There is no distension.     Tenderness: Tenderness noted to the left lower quadrant with no guarding.  Musculoskeletal:     Right lower leg: No edema.     Left lower leg: No edema.  Skin:    General: Skin is warm.  Neurological:     General: No focal deficit present.     Data Reviewed: I have personally reviewed patient's laboratory results showing sodium 142 potassium 3.8 glucose 110 WBC 6.8 hemoglobin 12.4   Disposition: Status is: Inpatient Continues to meet  inpatient criteria given management for current abdominal pain due to acute pancreatitis as well as pancreatic mass     Planned Discharge Destination: Home   Time spent: 42 minutes   Family communication: Sister present at bedside and updated      Vitals:   07/05/22 1916 07/06/22 0424 07/06/22 0755 07/06/22 1517  BP: 121/75 118/62 127/76 (!) 135/96  Pulse: (!) 46 (!) 51 (!) 58 60  Resp: 16 17 18 18   Temp: 98.1 F (36.7 C) 98.1 F (36.7 C) 98 F (36.7 C) 98.7 F (37.1 C)  TempSrc: Oral   Oral Oral  SpO2: 99% 99% 98% 95%  Weight:      Height:         Author: Loyce Dys, MD 07/06/2022 3:46 PM  For on call review [email or web address].

## 2022-07-06 NOTE — Progress Notes (Signed)
[facility]  [phone] [phone]  ID: David Harrell OB: 09-06-1960  MR#: [id number]  AOZ#:[id number]  Patient Care Team: Gracelyn Nurse, MD as PCP - General (Internal Medicine)  CHIEF COMPLAINT: Pancreatic tail mass.  INTERVAL HISTORY: Patient continues to have abdominal pain, but states it is mildly improved.  He otherwise feels well.  EGD completed yesterday was unrevealing.  REVIEW OF SYSTEMS:   Review of Systems  Constitutional:  Positive for malaise/fatigue. Negative for fever and weight loss.  Respiratory: Negative.  Negative for cough, hemoptysis and shortness of breath.   Cardiovascular: Negative.  Negative for chest pain and leg swelling.  Gastrointestinal:  Positive for abdominal pain. Negative for nausea.  Genitourinary:  Positive for flank pain. Negative for dysuria.  Musculoskeletal:  Negative for back pain.  Skin: Negative.  Negative for rash.  Neurological:  Positive for weakness. Negative for dizziness, focal weakness and headaches.  Psychiatric/Behavioral: Negative.  The patient is not nervous/anxious.     As per HPI. Otherwise, a complete review of systems is negative.  PAST MEDICAL HISTORY: Past Medical History:  Diagnosis Date   Arthritis    Hiatal hernia    Hypertension     PAST SURGICAL HISTORY: Past Surgical History:  Procedure Laterality Date   ESOPHAGOGASTRODUODENOSCOPY (EGD) WITH PROPOFOL N/A 07/05/2022   Procedure: ESOPHAGOGASTRODUODENOSCOPY (EGD) WITH PROPOFOL;  Surgeon: [name], DO;  Location: [facility] ENDOSCOPY;  Service: Gastroenterology;  Laterality: N/A;   JOINT REPLACEMENT      FAMILY HISTORY: History reviewed. No pertinent family history.  ADVANCED DIRECTIVES (Y/N):  @ADVDIR @  HEALTH MAINTENANCE: Social History   Tobacco Use   Smoking status: Every Day    Packs/day: 0.50    Years: 40.00    Additional pack years: 0.00    Total pack years: 20.00    Types: Cigarettes    Smokeless tobacco: Never  Substance Use Topics   Alcohol use: Not Currently   Drug use: Yes    Frequency: 1.0 times per week    Types: Marijuana     Colonoscopy:  PAP:  Bone density:  Lipid panel:  Allergies  Allergen Reactions   Aspirin    Penicillins    Tape    Vicodin [Hydrocodone-Acetaminophen]     Current Facility-Administered Medications  Medication Dose Route Frequency Provider Last Rate Last Admin   ALPRAZolam (XANAX) tablet 1 mg  1 mg Oral Q12H PRN Gertha Calkin, MD   1 mg at 07/06/22 1443   heparin injection 5,000 Units  5,000 Units Subcutaneous Q8H Irena Cords V, MD   5,000 Units at 07/06/22 1302   lisinopril (ZESTRIL) tablet 20 mg  20 mg Oral Daily Rosezetta Schlatter T, MD   20 mg at 07/06/22 0912   morphine (PF) 2 MG/ML injection 2 mg  2 mg Intravenous Q4H PRN Gertha Calkin, MD   2 mg at 07/06/22 1443   nicotine (NICODERM CQ - dosed in mg/24 hours) patch 21 mg  21 mg Transdermal Daily Irena Cords V, MD   21 mg at 07/06/22 0913   ondansetron (ZOFRAN) tablet 4 mg  4 mg Oral Q6H PRN Gertha Calkin, MD       Or   ondansetron [facility]) injection 4 mg  4 mg Intravenous Q6H PRN Gertha Calkin, MD   4 mg at 07/03/22 0422   Oral care mouth rinse  15 mL Mouth Rinse PRN Loyce Dys, MD       oxyCODONE (Oxy IR/ROXICODONE) immediate  release tablet 10 mg  10 mg Oral Q4H PRN Rosezetta Schlatter T, MD   10 mg at 07/06/22 1300   pantoprazole (PROTONIX) EC tablet 40 mg  40 mg Oral BID Prince Solian F, RPH       rosuvastatin (CRESTOR) tablet 20 mg  20 mg Oral Daily Irena Cords V, MD   20 mg at 07/06/22 0912   sodium chloride flush (NS) 0.9 % injection 3 mL  3 mL Intravenous Q12H Irena Cords V, MD   3 mL at 07/06/22 0913    OBJECTIVE: Vitals:   07/06/22 0755 07/06/22 1517  BP: 127/76 (!) 135/96  Pulse: (!) 58 60  Resp: 18 18  Temp: 98 F (36.7 C) 98.7 F (37.1 C)  SpO2: 98% 95%     Body mass index is 21.29 kg/m.    ECOG FS:2 - Symptomatic, <50% confined to bed  General:  Well-developed, well-nourished, no acute distress. Eyes: Pink conjunctiva, anicteric sclera. HEENT: Normocephalic, moist mucous membranes. Lungs: No audible wheezing or coughing. Heart: Regular rate and rhythm. Abdomen: Soft, nontender, no obvious distention. Musculoskeletal: No edema, cyanosis, or clubbing. Neuro: Alert, answering all questions appropriately. Cranial nerves grossly intact. Skin: No rashes or petechiae noted. Psych: Normal affect.   LAB RESULTS:  Lab Results  Component Value Date   NA 142 07/06/2022   K 3.8 07/06/2022   CL 108 07/06/2022   CO2 28 07/06/2022   GLUCOSE 110 (H) 07/06/2022   BUN 5 (L) 07/06/2022   CREATININE 0.77 07/06/2022   CALCIUM 8.6 (L) 07/06/2022   PROT 6.4 (L) 07/03/2022   ALBUMIN 3.5 07/03/2022   AST 14 (L) 07/03/2022   ALT 15 07/03/2022   ALKPHOS 51 07/03/2022   BILITOT 0.6 07/03/2022   GFRNONAA >60 07/06/2022   GFRAA >60 04/13/2018    Lab Results  Component Value Date   WBC 6.8 07/06/2022   NEUTROABS 3.2 07/06/2022   HGB 12.4 (L) 07/06/2022   HCT 37.8 (L) 07/06/2022   MCV 88.9 07/06/2022   PLT 181 07/06/2022     STUDIES: MR ABDOMEN W WO CONTRAST  Result Date: 07/03/2022 CLINICAL DATA:  Pancreatic tail lesion on CT scan earlier same day. EXAM: MRI ABDOMEN WITHOUT AND WITH CONTRAST TECHNIQUE: Multiplanar multisequence MR imaging of the abdomen was performed both before and after the administration of intravenous contrast. CONTRAST:  7mL GADAVIST GADOBUTROL 1 MMOL/ML IV SOLN COMPARISON:  CT scan from earlier same day. FINDINGS: Lower chest: Unremarkable. Hepatobiliary: No suspicious focal abnormality in the liver parenchyma. Mild intrahepatic biliary duct prominence. No evidence for gallstones. No common bile duct dilatation (5 mm diameter in the head of the pancreas. No evidence for choledocholithiasis Pancreas: 4.4 x 3.1 x 2.8 cm multilocular mass lesion is identified in the tail of pancreas. There is atrophy in the tip of the  pancreas with associated main duct dilatation. After IV contrast administration there is enhancement in the periphery of the lesion with enhancing smooth internal septations. Spleen:  No splenomegaly. No focal mass lesion. Adrenals/Urinary Tract: No adrenal nodule or mass. Right kidney unremarkable. 1.7 cm simple cyst noted upper pole left kidney. No followup imaging is recommended. Stomach/Bowel: Stomach is unremarkable. No gastric wall thickening. No evidence of outlet obstruction. Duodenum is normally positioned as is the ligament of Treitz. No small bowel or colonic dilatation within the visualized abdomen. Vascular/Lymphatic: No abdominal aortic aneurysm. No abdominal lymphadenopathy. 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 Other:  No intraperitoneal free fluid. Musculoskeletal: No focal suspicious marrow enhancement within the visualized  bony anatomy. Artifact from lower lumbar fixation hardware evident. IMPRESSION: 1. 4.4 x 3.1 x 2.8 cm multilocular mass lesion in the tail of the pancreas with associated main duct dilatation and parenchymal atrophy in the tip of the pancreatic tail. Mucinous cystic neoplasm, IPMN, and pseudocyst could all have this appearance although mucinous cystic neoplasm favored based on imaging. Endoscopic ultrasound recommended to further evaluate. 2. 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 3. Mild intrahepatic biliary duct dilatation. No common bile duct dilatation. No evidence for choledocholithiasis. Electronically Signed   By: Kennith Center M.D.   On: 07/03/2022 08:47   MR 3D Recon At Scanner  Result Date: 07/03/2022 CLINICAL DATA:  Pancreatic tail lesion on CT scan earlier same day. EXAM: MRI ABDOMEN WITHOUT AND WITH CONTRAST TECHNIQUE: Multiplanar multisequence MR imaging of the abdomen was performed both before and after the administration of intravenous contrast. CONTRAST:  7mL GADAVIST GADOBUTROL 1 MMOL/ML IV SOLN COMPARISON:  CT scan from earlier same day. FINDINGS: Lower chest:  Unremarkable. Hepatobiliary: No suspicious focal abnormality in the liver parenchyma. Mild intrahepatic biliary duct prominence. No evidence for gallstones. No common bile duct dilatation (5 mm diameter in the head of the pancreas. No evidence for choledocholithiasis Pancreas: 4.4 x 3.1 x 2.8 cm multilocular mass lesion is identified in the tail of pancreas. There is atrophy in the tip of the pancreas with associated main duct dilatation. After IV contrast administration there is enhancement in the periphery of the lesion with enhancing smooth internal septations. Spleen:  No splenomegaly. No focal mass lesion. Adrenals/Urinary Tract: No adrenal nodule or mass. Right kidney unremarkable. 1.7 cm simple cyst noted upper pole left kidney. No followup imaging is recommended. Stomach/Bowel: Stomach is unremarkable. No gastric wall thickening. No evidence of outlet obstruction. Duodenum is normally positioned as is the ligament of Treitz. No small bowel or colonic dilatation within the visualized abdomen. Vascular/Lymphatic: No abdominal aortic aneurysm. No abdominal lymphadenopathy. 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 Other:  No intraperitoneal free fluid. Musculoskeletal: No focal suspicious marrow enhancement within the visualized bony anatomy. Artifact from lower lumbar fixation hardware evident. IMPRESSION: 1. 4.4 x 3.1 x 2.8 cm multilocular mass lesion in the tail of the pancreas with associated main duct dilatation and parenchymal atrophy in the tip of the pancreatic tail. Mucinous cystic neoplasm, IPMN, and pseudocyst could all have this appearance although mucinous cystic neoplasm favored based on imaging. Endoscopic ultrasound recommended to further evaluate. 2. 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 3. Mild intrahepatic biliary duct dilatation. No common bile duct dilatation. No evidence for choledocholithiasis. Electronically Signed   By: Kennith Center M.D.   On: 07/03/2022 08:47   CT ABDOMEN PELVIS W  CONTRAST  Result Date: 07/02/2022 CLINICAL DATA:  Left-sided abdominal pain, suspected pancreatitis. EXAM: CT ABDOMEN AND PELVIS WITH CONTRAST TECHNIQUE: Multidetector CT imaging of the abdomen and pelvis was performed using the standard protocol following bolus administration of intravenous contrast. RADIATION DOSE REDUCTION: This exam was performed according to the departmental dose-optimization program which includes automated exposure control, adjustment of the mA and/or kV according to patient size and/or use of iterative reconstruction technique. CONTRAST:  OMNIPAQUE IOHEXOL 300 MG/ML  SOLN COMPARISON:  04/07/2022 FINDINGS: Lower chest: Unremarkable Hepatobiliary: Stable 6 mm cyst in segment 2 of the liver. Borderline intrahepatic biliary dilatation. Gallbladder unremarkable. No extrahepatic biliary dilatation. Pancreas: In the pancreatic tail the site of prior mild hypoenhancement and inflammation, there is a larger region of hypoenhancement measuring 3.8 by 3.0 cm on image 18 series  2, and faintly reduced enhancement further distally in the pancreatic tail is well. Moreover, there is a bilobed cystic lesion within the pancreas measuring 2.6 by 1.6 by 2.0 cm. This cystic lesion does not have substantial peripheral enhancement and accordingly I favor a pseudocyst over abscess. In this setting, underlying pancreatic adenocarcinoma is difficult to totally exclude. Although a substantial portion of the pancreatic tail is hypoenhancing, we do not demonstrate lack of enhancement to suggest pancreatic necrosis. Pancreatic body and head appear normal. Mild stranding in the adipose tissues around the inflamed portion of the pancreas. Spleen: Unremarkable Adrenals/Urinary Tract: No clinically significant findings. The median lobe of the prostate gland indents the bladder base. Stomach/Bowel: Unremarkable Vascular/Lymphatic: Severely attenuated and borderline occluded splenic vein along the inflamed portion of  the pancreas with gastric collateral vessels to the splenic vein. Atherosclerosis is present, including aortoiliac atherosclerotic disease. No pathologic adenopathy. Reproductive: Mild prostatomegaly. Other: No supplemental non-categorized findings. Musculoskeletal: Posterolateral rod and pedicle screw fixation with interbody fusion at L5-S1. Posterior decompression at L5-S1. IMPRESSION: 1. Enlarging region of hypoenhancement in the pancreatic tail at the site of prior mild hypoenhancement and inflammation. New bilobed cystic lesion centrally within this hypoenhancing region measuring 2.6 by 1.6 by 2.0 cm (volume = 4.4 cm^3). Although the appearance favors a pseudocyst over abscess, the possibility of underlying pancreatic adenocarcinoma with secondary inflammation is difficult to totally exclude. Assuming typical clinical presentation and scenario for pancreatitis, follow up pancreatic protocol MRI with and without contrast is recommended in 2-3 months time to reassess the region and ensure expected improvement. If the clinical scenario is atypical for pancreatitis, then a more aggressive course for potential sampling of the pancreatic tail should be considered. 2. 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 3. Borderline intrahepatic biliary dilatation. 4. Mild prostatomegaly. 5. Prior fusion at L5-S1. 6. Aortic atherosclerosis. Aortic Atherosclerosis (ICD10-I70.0). Electronically Signed   By: Gaylyn Rong M.D.   On: 07/02/2022 18:05    ASSESSMENT: Pancreatic tail mass.  PLAN:    Pancreatic tail mass: MRI results from July 03, 2018, reviewed independently and report as above with a 4.4 cm multifocally mass lesion at the tail the pancreas highly suspicious for underlying malignancy.  No other disease is noted on imaging.  CT scan from April 07, 2022 did not note any pancreatic mass.  CA 19-9  is pending at time of dictation.  Appreciate GI input.  EGD from yesterday does not reveal any significant pathology.  Patient tentatively has EUS scheduled as an outpatient on Jul 22, 2022.  Will arrange follow-up in the cancer center after his EUS to discuss his final pathology results and treatment planning if necessary. Abdominal pain: Likely secondary to pancreatitis/pancreatic mass.  Continue current narcotic regimen. Anemia: Mild, monitor. Disposition: Okay to discharge from an oncology standpoint with follow-up as above.  Will follow.   Jeralyn Ruths, MD   07/06/2022 4:10 PM

## 2022-07-06 NOTE — Progress Notes (Signed)
The patient is receiving Protonix by the intravenous route.  Based on criteria approved by the Pharmacy and Therapeutics Committee and the Medical Executive Committee, the medication is being converted to the equivalent oral dose form.  These criteria include: -No active GI bleeding -Able to tolerate diet of full liquids (or better) or tube feeding -Able to tolerate other medications by the oral or enteral route  If you have any questions about this conversion, please contact the Pharmacy Department.  Thank you.   Prince Solian III, PharmD Clinical Pharmacist

## 2022-07-07 ENCOUNTER — Telehealth (HOSPITAL_COMMUNITY): Payer: Self-pay | Admitting: Pharmacy Technician

## 2022-07-07 ENCOUNTER — Other Ambulatory Visit (HOSPITAL_COMMUNITY): Payer: Self-pay

## 2022-07-07 ENCOUNTER — Telehealth: Payer: Self-pay

## 2022-07-07 ENCOUNTER — Other Ambulatory Visit: Payer: Self-pay

## 2022-07-07 DIAGNOSIS — I10 Essential (primary) hypertension: Secondary | ICD-10-CM | POA: Diagnosis not present

## 2022-07-07 DIAGNOSIS — K8689 Other specified diseases of pancreas: Secondary | ICD-10-CM

## 2022-07-07 DIAGNOSIS — Z720 Tobacco use: Secondary | ICD-10-CM

## 2022-07-07 DIAGNOSIS — R739 Hyperglycemia, unspecified: Secondary | ICD-10-CM

## 2022-07-07 DIAGNOSIS — K859 Acute pancreatitis without necrosis or infection, unspecified: Secondary | ICD-10-CM | POA: Diagnosis not present

## 2022-07-07 DIAGNOSIS — R1032 Left lower quadrant pain: Secondary | ICD-10-CM | POA: Diagnosis not present

## 2022-07-07 LAB — BASIC METABOLIC PANEL
Anion gap: 4 — ABNORMAL LOW (ref 5–15)
BUN: 10 mg/dL (ref 6–20)
CO2: 29 mmol/L (ref 22–32)
Calcium: 8.5 mg/dL — ABNORMAL LOW (ref 8.9–10.3)
Chloride: 107 mmol/L (ref 98–111)
Creatinine, Ser: 0.83 mg/dL (ref 0.61–1.24)
GFR, Estimated: >60 mL/min (ref >60)
Glucose, Bld: 118 mg/dL — ABNORMAL HIGH (ref 70–99)
Potassium: 4 mmol/L (ref 3.5–5.1)
Sodium: 140 mmol/L (ref 135–145)

## 2022-07-07 LAB — CBC WITH DIFFERENTIAL/PLATELET
Abs Immature Granulocytes: 0.01 10*3/uL (ref 0.00–0.07)
Basophils Absolute: 0.1 10*3/uL (ref 0.0–0.1)
Basophils Relative: 1 %
Eosinophils Absolute: 0.3 10*3/uL (ref 0.0–0.5)
Eosinophils Relative: 4 %
HCT: 37.4 % — ABNORMAL LOW (ref 39.0–52.0)
Hemoglobin: 12.5 g/dL — ABNORMAL LOW (ref 13.0–17.0)
Immature Granulocytes: 0 %
Lymphocytes Relative: 36 %
Lymphs Abs: 2.6 10*3/uL (ref 0.7–4.0)
MCH: 29.1 pg (ref 26.0–34.0)
MCHC: 33.4 g/dL (ref 30.0–36.0)
MCV: 87.2 fL (ref 80.0–100.0)
Monocytes Absolute: 0.6 10*3/uL (ref 0.1–1.0)
Monocytes Relative: 9 %
Neutro Abs: 3.7 10*3/uL (ref 1.7–7.7)
Neutrophils Relative %: 50 %
Platelets: 156 10*3/uL (ref 150–400)
RBC: 4.29 MIL/uL (ref 4.22–5.81)
RDW: 12.9 % (ref 11.5–15.5)
WBC: 7.2 10*3/uL (ref 4.0–10.5)
nRBC: 0 % (ref 0.0–0.2)

## 2022-07-07 LAB — SURGICAL PATHOLOGY

## 2022-07-07 MED ORDER — ONDANSETRON HCL 4 MG PO TABS: 4.0000 mg | ORAL_TABLET | Freq: Four times a day (QID) | ORAL | 0 refills | Status: DC | PRN

## 2022-07-07 MED ORDER — APIXABAN 5 MG PO TABS: ORAL_TABLET | ORAL | 0 refills | Status: DC

## 2022-07-07 MED ORDER — PANTOPRAZOLE SODIUM 40 MG PO TBEC: 40.0000 mg | DELAYED_RELEASE_TABLET | Freq: Two times a day (BID) | ORAL | 0 refills | Status: DC

## 2022-07-07 NOTE — Telephone Encounter (Signed)
-----   Message from Lemar Lofty., MD sent at 07/06/2022  5:01 PM EDT ----- Regarding: RE: No problem. Please forward me the CA 19-9 results when they do return.  David Harrell, This patient is currently hospitalized but has a pancreatic lesion/cyst that needs EUS. Please offer the patient by last available May 16 date. Please update Dr. Orlie Dakin and RN Kavin Leech. Thanks. GM   ----- Message ----- From: Jeralyn Ruths, MD Sent: 07/06/2022   3:54 PM EDT To: [name], DO; Benita Gutter, RN; # Subject: RE:                                            Thank you!  -Tim ----- Message ----- From: Lemar Lofty., MD Sent: 07/06/2022   1:05 PM EDT To: Jeralyn Ruths, MD; # Subject: RE:                                            Hey team, Thanks for reaching out. I have 1 last slot on May 16. That is my soonest available at this point in time. Let me know and then I can have my team work on that. GM ----- Message ----- From: [name], DO Sent: 07/05/2022   2:58 PM EDT To: Jeralyn Ruths, MD; Benita Gutter, RN; # Subject: RE:                                            David Harrell completed today. No varices. No portal hypertensive features.  Ca19-9 pending Thanks, Elfredia Nevins ----- Message ----- From: Benita Gutter, RN Sent: 07/05/2022   2:52 PM EDT To: Jeralyn Ruths, MD; #  Hi Dr. Meridee Score, would you review his chart and your schedule for availability. [facility] first available is 5/23. [facility] is a month or so as well. Thank you!  Silva Bandy ----- Message ----- From: [name], DO Sent: 07/03/2022  10:22 AM EDT To: Benita Gutter, RN  Hi Kristi,  I hope this finds you well.  This patient was seen by myself and Dr. Orlie Dakin in the hospital. He has a pancreatic tail lesion appreciated on both CT and MRI. He is in need of endoscopic ultrasound. CA19-9 is pending.  Please let me know if you need additional  information.  I will set him up for f/u in our office as well.  Thanks! Elfredia Nevins

## 2022-07-07 NOTE — Plan of Care (Signed)
Patient is adequate for discharge. Resolving plan of care.  David Harrell

## 2022-07-07 NOTE — Hospital Course (Signed)
David Harrell is an 62 y.o. male seen in ed 07/02/22 for abd pain and nausea. Pt with episode of acute pancreatitis at the end of January for which he was hospitalized. He reports he was doing somewhat better until recently where he started developing more LLQ pain which is similar to his episode of pancreatitis.  Patient underwent MRI of the abdomen with findings of pancreatic tail mass. 04/28 oncology consulted, advised follow-up in the cancer center after EUS to discuss his final pathology results and treatment planning if necessary.  GI saw pt 04/28 for initial consult. EGD 04/29 to screen for varices - no signs of varices or portal htn noted, EUS outpatient planned for 05/16. 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 GI s/o.    Consultants:  GI HemOnc  Procedures: EGD 04/29      ASSESSMENT & PLAN:   Principal Problem:   Abdominal pain Active Problems:   Pancreatitis   Current tobacco use   Hypertension   Hyperglycemia   Pancreatic mass    Pancreatic Tail Lesion- noted on imaging - multilocular pancreatic tail lesion measuring 4.4 x 3.1 x 2.8 cm with pancreatic main duct dilation associated and parenchymal atrophy - CA19-9 pending - Suspicious for malignancy given other clinical findings, however, may be IPMN given recent pancreatitis episode in active tobacco use and h/o etoh use (none in 20 years)   # Chronic Pancreatitis- possible acute episode - potentially had Cullen sign on previous episode. No flank or other bruising. 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

## 2022-07-07 NOTE — Telephone Encounter (Signed)
EUS has been scheduled for 07/22/22 at 4 pm at [facility] with GM

## 2022-07-07 NOTE — Progress Notes (Signed)
IV removed without complications. Discharge education completed. Patient being wheeled to the [facility] exit for discharge in stable condition.  David Harrell

## 2022-07-07 NOTE — TOC Benefit Eligibility Note (Signed)
Patient Advocate Encounter  Insurance verification completed.    The patient is currently admitted and upon discharge could be taking Eliquis Starter Pack.  The current 30 day co-pay is $45.00.   The patient is insured through [employer] Medicare Part D   This test claim was processed through [facility]- copay amounts may vary at other pharmacies due to pharmacy/plan contracts, or as the patient moves through the different stages of their insurance plan.  Rosie Golson, CPHT Pharmacy Patient Advocate Specialist [facility] Patient Advocate Team Direct Number: [phone]  Fax: [phone]       

## 2022-07-07 NOTE — Consult Note (Signed)
[facility] Customer service manager [facility]) Accountable Care Organization (ACO) [facility] Liaison Note  07/07/2022  David Harrell [facility] 05-14-1960 [id number]  Location: [facility] RN Hospital Liaison screened the patient remotely at [facility].  Insurance: [employer]   David Harrell is a 62 y.o. male who is a Primary Care Patient of Gracelyn Nurse, MD. The patient was screened for readmission hospitalization with noted low risk score for unplanned readmission risk with 2 IP in 6 months.  The patient was assessed for potential [facility]) Care Management service needs for post hospital transition for care coordination. Review of patient's electronic medical record reveals patient will discharge with no anticipated needs.   Plan: [facility] Liaison will continue to follow progress and disposition to asess for post hospital community care coordination/management needs.  Referral request for community care coordination: anticipate [facility] Transitions of Care Team follow up.   [facility] Care Management/Population Health does not replace or interfere with any arrangements made by the Inpatient Transition of Care team.   For questions contact:   Elliot Cousin, RN, BSN [facility] Liaison [place]   [facility]  Population Health Office Hours MTWF 8:00 am to 6 pm off on Thursday [phone] mobile [phone] [Office toll free line][facility] Office Hours are M-F 8:30 - 5 pm 24 hour nurse advise line [phone] Conceirge  [email or web address]

## 2022-07-07 NOTE — Discharge Summary (Signed)
Physician Discharge Summary   [name] MRN: [id number]  DOB: 27-Sep-1960   Admit:     Date of Admission: 07/02/2022 Admitted from: home   Discharge: Date of discharge: 07/07/22 Disposition: Home Condition at discharge: good  CODE STATUS: FULL CODE     Discharge Physician: Sunnie Nielsen, DO [facility]     PCP: Gracelyn Nurse, MD  Recommendations for Outpatient Follow-up:  Follow up with PCP Gracelyn Nurse, MD in 1-2 weeks Please obtain labs/tests: CBC, BMP in 1-2 weeks Follow up as directed w/ GI 07/22/2022, hold Eliquis blood thinner 2 days prior to that (do NOT take on 05/14 or 05/15) Follow up with oncology after biopsy results are back Please follow up on the following pending results: EGD biopsy results  PCP AND OTHER OUTPATIENT PROVIDERS: SEE BELOW FOR SPECIFIC DISCHARGE INSTRUCTIONS PRINTED FOR PATIENT IN ADDITION TO GENERIC AVS PATIENT INFO    Discharge Instructions     Ambulatory Referral for Lung Cancer Scre   Complete by: As directed    Diet - low sodium heart healthy   Complete by: As directed    Increase activity slowly   Complete by: As directed          Discharge Diagnoses: Principal Problem:   Abdominal pain Active Problems:   Pancreatitis   Current tobacco use   Hypertension   Hyperglycemia   Pancreatic mass       Hospital Course: David Harrell is an 62 y.o. male seen in ed 07/02/22 for abd pain and nausea. Pt with episode of acute pancreatitis at the end of January for which he was hospitalized. He reports he was doing somewhat better until recently where he started developing more LLQ pain which is similar to his episode of pancreatitis.  Patient underwent MRI of the abdomen with findings of pancreatic tail mass. 04/28 oncology consulted, advised follow-up in the cancer center after EUS to discuss his final pathology results and treatment planning if necessary.  GI saw pt 04/28 for initial consult. EGD  04/29 to screen for varices - no signs of varices or portal htn noted, EUS outpatient planned for 05/16. 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 GI s/o.   Consultants:  GI HemOnc  Procedures: EGD 04/29      ASSESSMENT & PLAN:   Principal Problem:   Abdominal pain Active Problems:   Pancreatitis   Current tobacco use   Hypertension   Hyperglycemia   Pancreatic mass    Pancreatic Tail Lesion- noted on imaging - multilocular pancreatic tail lesion measuring 4.4 x 3.1 x 2.8 cm with pancreatic main duct dilation associated and parenchymal atrophy - CA19-9 pending - Suspicious for malignancy given other clinical findings, however, may be IPMN given recent pancreatitis episode in active tobacco use and h/o etoh use (none in 20 years)   Chronic Pancreatitis- possible acute episode - potentially had Cullen sign on previous episode. No flank or other bruising. 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   apixaban 5 MG Tabs tablet Commonly known as: Eliquis Take 2 tablets (10 mg total) by mouth 2 (two) times daily for 7 days, THEN 1 tablet (5 mg total) 2 (two)  times daily for 23 days. HOLD THIS MEDICATION FOR 2 DAYS PRIOR TO YOUR BIOPSY PROCEDURE. Start taking on: Jul 07, 2022   lisinopril 20 MG tablet Commonly known as: ZESTRIL Take 20 mg by mouth daily.   morphine 15 MG 12 hr tablet Commonly known as: MS CONTIN Take 15 mg by mouth every evening.   morphine 30 MG 12 hr tablet Commonly known as: MS CONTIN Take 30 mg by mouth daily after breakfast.   ondansetron 4 MG tablet Commonly known as: ZOFRAN Take 1 tablet (4 mg total) by mouth every 6 (six) hours as needed for nausea.   Oxycodone HCl 10 MG Tabs Take 1 tablet by mouth every 4 (four) hours as needed.   pantoprazole 40 MG tablet Commonly known as: PROTONIX Take 1 tablet (40 mg total) by mouth 2 (two) times daily.   rosuvastatin 20 MG tablet Commonly known as: CRESTOR Take 20 mg by mouth daily.          Allergies  Allergen Reactions   Aspirin    Penicillins    Tape    Vicodin [Hydrocodone-Acetaminophen]      Subjective: pt feeling wlel today, abd pain improved, no other concerns    Discharge Exam: BP 125/73 (BP Location: Left Arm)   Pulse 62   Temp (!) 97.4 F (36.3 C) (Oral)   Resp 15   Ht 5\' 8"  (1.727 m)   Wt 63.5 kg   SpO2 97%   BMI 21.29 kg/m  General: Pt is alert, awake, not in acute distress Cardiovascular: RRR, S1/S2 +, no rubs, no gallops Respiratory: CTA bilaterally, no wheezing, no rhonchi Abdominal: Soft, NT, ND, bowel sounds + Extremities: no edema, no cyanosis     The results of significant diagnostics from this hospitalization (including imaging, microbiology, ancillary and laboratory) are listed below for reference.     Microbiology: No results found for this or any previous visit (from the past 240 hour(s)).   Labs: BNP (last 3 results) No results for input(s): "BNP" in the last 8760 hours. Basic Metabolic Panel: Recent Labs  Lab 07/03/22 0533 07/04/22 0551 07/05/22 0431 07/06/22 0458 07/07/22 0635  NA 141 140 142 142  140  K 4.0 3.7 3.9 3.8 4.0  CL 110 108 107 108 107  CO2 26 27 30 28 29   GLUCOSE 105* 107* 102* 110* 118*  BUN 8 6 <5* 5* 10  CREATININE 0.76 0.77 0.80 0.77 0.83  CALCIUM 8.5* 8.6* 8.6* 8.6* 8.5*   Liver Function Tests: Recent Labs  Lab 07/02/22 1503 07/03/22 0533  AST 23 14*  ALT 19 15  ALKPHOS 61 51  BILITOT 0.6 0.6  PROT 8.3* 6.4*  ALBUMIN 4.6 3.5   Recent Labs  Lab 07/02/22 1503  LIPASE 26   No results for input(s): "AMMONIA" in the last 168 hours. CBC: Recent Labs  Lab 07/03/22 0533  07/04/22 0551 07/05/22 0431 07/06/22 0458 07/07/22 0635  WBC 6.1 5.8 6.4 6.8 7.2  NEUTROABS  --  2.5 2.9 3.2 3.7  HGB 12.3* 12.4* 12.1* 12.4* 12.5*  HCT 37.4* 37.8* 36.1* 37.8* 37.4*  MCV 88.4 88.5 87.6 88.9 87.2  PLT 200 198 183 181 156   Cardiac Enzymes: No results for input(s): "CKTOTAL", "CKMB", "CKMBINDEX", "TROPONINI" in the last 168 hours. BNP: Invalid input(s): "POCBNP" CBG: No results for input(s): "GLUCAP" in the last 168 hours. D-Dimer No results for input(s): "DDIMER" in the last 72 hours. Hgb A1c No results for input(s): "HGBA1C" in the last 72 hours. Lipid Profile No results for input(s): "CHOL", "HDL", "LDLCALC", "TRIG", "CHOLHDL", "LDLDIRECT" in the last 72 hours. Thyroid function studies No results for input(s): "TSH", "T4TOTAL", "T3FREE", "THYROIDAB" in the last 72 hours.  Invalid input(s): "FREET3" Anemia work up No results for input(s): "VITAMINB12", "FOLATE", "FERRITIN", "TIBC", "IRON", "RETICCTPCT" in the last 72 hours. Urinalysis    Component Value Date/Time   COLORURINE YELLOW (A) 07/02/2022 1501   APPEARANCEUR HAZY (A) 07/02/2022 1501   APPEARANCEUR Cloudy 06/30/2014 1922   LABSPEC 1.018 07/02/2022 1501   LABSPEC 1.015 06/30/2014 1922   PHURINE 5.0 07/02/2022 1501   GLUCOSEU NEGATIVE 07/02/2022 1501   GLUCOSEU Negative 06/30/2014 1922   HGBUR LARGE (A) 07/02/2022 1501   BILIRUBINUR NEGATIVE 07/02/2022 1501   BILIRUBINUR Negative  06/30/2014 1922   KETONESUR NEGATIVE 07/02/2022 1501   PROTEINUR 30 (A) 07/02/2022 1501   NITRITE NEGATIVE 07/02/2022 1501   LEUKOCYTESUR NEGATIVE 07/02/2022 1501   LEUKOCYTESUR Negative 06/30/2014 1922   Sepsis Labs Recent Labs  Lab 07/04/22 0551 07/05/22 0431 07/06/22 0458 07/07/22 0635  WBC 5.8 6.4 6.8 7.2   Microbiology No results found for this or any previous visit (from the past 240 hour(s)). Imaging MR ABDOMEN W WO CONTRAST  Result Date: 07/03/2022 CLINICAL DATA:  Pancreatic tail lesion on CT scan earlier same day. EXAM: MRI ABDOMEN WITHOUT AND WITH CONTRAST TECHNIQUE: Multiplanar multisequence MR imaging of the abdomen was performed both before and after the administration of intravenous contrast. CONTRAST:  7mL GADAVIST GADOBUTROL 1 MMOL/ML IV SOLN COMPARISON:  CT scan from earlier same day. FINDINGS: Lower chest: Unremarkable. Hepatobiliary: No suspicious focal abnormality in the liver parenchyma. Mild intrahepatic biliary duct prominence. No evidence for gallstones. No common bile duct dilatation (5 mm diameter in the head of the pancreas. No evidence for choledocholithiasis Pancreas: 4.4 x 3.1 x 2.8 cm multilocular mass lesion is identified in the tail of pancreas. There is atrophy in the tip of the pancreas with associated main duct dilatation. After IV contrast administration there is enhancement in the periphery of the lesion with enhancing smooth internal septations. Spleen:  No splenomegaly. No focal mass lesion. Adrenals/Urinary Tract: No adrenal nodule or mass. Right kidney unremarkable. 1.7 cm simple cyst noted upper pole left kidney. No followup imaging is recommended. Stomach/Bowel: Stomach is unremarkable. No gastric wall thickening. No evidence of outlet obstruction. Duodenum is normally positioned as is the ligament of Treitz. No small bowel or colonic dilatation within the visualized abdomen. Vascular/Lymphatic: No abdominal aortic aneurysm. No abdominal  lymphadenopathy. 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 Other:  No intraperitoneal free fluid. Musculoskeletal: No focal suspicious marrow enhancement within the visualized bony anatomy. Artifact from lower lumbar fixation hardware evident. IMPRESSION: 1. 4.4 x 3.1 x 2.8 cm multilocular mass lesion in the tail of the pancreas with associated main duct dilatation and parenchymal atrophy in the tip of the pancreatic tail. Mucinous cystic neoplasm,  IPMN, and pseudocyst could all have this appearance although mucinous cystic neoplasm favored based on imaging. Endoscopic ultrasound recommended to further evaluate. 2. 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 3. Mild intrahepatic biliary duct dilatation. No common bile duct dilatation. No evidence for choledocholithiasis. Electronically Signed   By: Kennith Center M.D.   On: 07/03/2022 08:47   MR 3D Recon At Scanner  Result Date: 07/03/2022 CLINICAL DATA:  Pancreatic tail lesion on CT scan earlier same day. EXAM: MRI ABDOMEN WITHOUT AND WITH CONTRAST TECHNIQUE: Multiplanar multisequence MR imaging of the abdomen was performed both before and after the administration of intravenous contrast. CONTRAST:  7mL GADAVIST GADOBUTROL 1 MMOL/ML IV SOLN COMPARISON:  CT scan from earlier same day. FINDINGS: Lower chest: Unremarkable. Hepatobiliary: No suspicious focal abnormality in the liver parenchyma. Mild intrahepatic biliary duct prominence. No evidence for gallstones. No common bile duct dilatation (5 mm diameter in the head of the pancreas. No evidence for choledocholithiasis Pancreas: 4.4 x 3.1 x 2.8 cm multilocular mass lesion is identified in the tail of pancreas. There is atrophy in the tip of the pancreas with associated main duct dilatation. After IV contrast administration there is enhancement in the periphery of the lesion with enhancing smooth internal septations. Spleen:  No splenomegaly. No focal mass lesion. Adrenals/Urinary Tract: No adrenal nodule or mass. Right  kidney unremarkable. 1.7 cm simple cyst noted upper pole left kidney. No followup imaging is recommended. Stomach/Bowel: Stomach is unremarkable. No gastric wall thickening. No evidence of outlet obstruction. Duodenum is normally positioned as is the ligament of Treitz. No small bowel or colonic dilatation within the visualized abdomen. Vascular/Lymphatic: No abdominal aortic aneurysm. No abdominal lymphadenopathy. 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 Other:  No intraperitoneal free fluid. Musculoskeletal: No focal suspicious marrow enhancement within the visualized bony anatomy. Artifact from lower lumbar fixation hardware evident. IMPRESSION: 1. 4.4 x 3.1 x 2.8 cm multilocular mass lesion in the tail of the pancreas with associated main duct dilatation and parenchymal atrophy in the tip of the pancreatic tail. Mucinous cystic neoplasm, IPMN, and pseudocyst could all have this appearance although mucinous cystic neoplasm favored based on imaging. Endoscopic ultrasound recommended to further evaluate. 2. 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 3. Mild intrahepatic biliary duct dilatation. No common bile duct dilatation. No evidence for choledocholithiasis. Electronically Signed   By: Kennith Center M.D.   On: 07/03/2022 08:47   CT ABDOMEN PELVIS W CONTRAST  Result Date: 07/02/2022 CLINICAL DATA:  Left-sided abdominal pain, suspected pancreatitis. EXAM: CT ABDOMEN AND PELVIS WITH CONTRAST TECHNIQUE: Multidetector CT imaging of the abdomen and pelvis was performed using the standard protocol following bolus administration of intravenous contrast. RADIATION DOSE REDUCTION: This exam was performed according to the departmental dose-optimization program which includes automated exposure control, adjustment of the mA and/or kV according to patient size and/or use of iterative reconstruction technique. CONTRAST:  OMNIPAQUE IOHEXOL 300 MG/ML  SOLN COMPARISON:  04/07/2022 FINDINGS: Lower chest: Unremarkable  Hepatobiliary: Stable 6 mm cyst in segment 2 of the liver. Borderline intrahepatic biliary dilatation. Gallbladder unremarkable. No extrahepatic biliary dilatation. Pancreas: In the pancreatic tail the site of prior mild hypoenhancement and inflammation, there is a larger region of hypoenhancement measuring 3.8 by 3.0 cm on image 18 series 2, and faintly reduced enhancement further distally in the pancreatic tail is well. Moreover, there is a bilobed cystic lesion within the pancreas measuring 2.6 by 1.6 by 2.0 cm. This cystic lesion does not have substantial peripheral enhancement and accordingly I favor a  pseudocyst over abscess. In this setting, underlying pancreatic adenocarcinoma is difficult to totally exclude. Although a substantial portion of the pancreatic tail is hypoenhancing, we do not demonstrate lack of enhancement to suggest pancreatic necrosis. Pancreatic body and head appear normal. Mild stranding in the adipose tissues around the inflamed portion of the pancreas. Spleen: Unremarkable Adrenals/Urinary Tract: No clinically significant findings. The median lobe of the prostate gland indents the bladder base. 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 Atherosclerosis is present, including aortoiliac atherosclerotic disease. No pathologic adenopathy. Reproductive: Mild prostatomegaly. Other: No supplemental non-categorized findings. Musculoskeletal: Posterolateral rod and pedicle screw fixation with interbody fusion at L5-S1. Posterior decompression at L5-S1. IMPRESSION: 1. Enlarging region of hypoenhancement in the pancreatic tail at the site of prior mild hypoenhancement and inflammation. New bilobed cystic lesion centrally within this hypoenhancing region measuring 2.6 by 1.6 by 2.0 cm (volume = 4.4 cm^3). Although the appearance favors a  pseudocyst over abscess, the possibility of underlying pancreatic adenocarcinoma with secondary inflammation is difficult to totally exclude. Assuming typical clinical presentation and scenario for pancreatitis, follow up pancreatic protocol MRI with and without contrast is recommended in 2-3 months time to reassess the region and ensure expected improvement. If the clinical scenario is atypical for pancreatitis, then a more aggressive course for potential sampling of the pancreatic tail should be considered. 2. 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 3. Borderline intrahepatic biliary dilatation. 4. Mild prostatomegaly. 5. Prior fusion at L5-S1. 6. Aortic atherosclerosis. Aortic Atherosclerosis (ICD10-I70.0). Electronically Signed   By: Gaylyn Rong M.D.   On: 07/02/2022 18:05      Time coordinating discharge: over 30 minutes  SIGNED:  Sunnie Nielsen DO [facility]

## 2022-07-07 NOTE — Telephone Encounter (Signed)
Pharmacy Patient Advocate Encounter  Insurance verification completed.    The patient is insured through [employer] Part D   The patient is currently admitted and ran test claims for the following: Eliquis .  Copays and coinsurance results were relayed to Inpatient clinical team.      

## 2022-07-08 NOTE — Telephone Encounter (Signed)
EUS scheduled, pt instructed and medications reviewed.  Patient instructions mailed to home.  Patient to call with any questions or concerns.  

## 2022-07-09 ENCOUNTER — Telehealth: Payer: Self-pay

## 2022-07-09 NOTE — Telephone Encounter (Signed)
Left message on machine to call back appt has been moved to 5/6 at 1015 am per GM.

## 2022-07-09 NOTE — Telephone Encounter (Signed)
----- Message from Lemar Lofty., MD sent at 07/09/2022  1:38 PM EDT ----- Regarding: RE: David Harrell, Please see if we can offer this patient the procedure slot that has just opened up for next week. He is on anticoagulation, but we have the OK from Hematology to hold it for the procedure he has scheduled for 5/16. If the date/time works for patient, please offer him the slot for next week. Otherwise, keep the current date. GM  FYI TJF and SMR and KDS, will see if we can get patient in sooner to understand cyst v mass v both.  ----- Message ----- From: Jeralyn Ruths, MD Sent: 07/08/2022   3:59 PM EDT To: [name], DO; Benita Gutter, RN; # Subject: RE:                                            Thanks!  ----- Message ----- From: [name], DO Sent: 07/08/2022   2:44 PM EDT To: Jeralyn Ruths, MD; Benita Gutter, RN; # Subject: RE:                                            I called the lab since it was taking a while- CA 19-9 is 130 (on 4/26; ULN for this lab is 35)   ----- Message ----- From: Lemar Lofty., MD Sent: 07/06/2022   1:05 PM EDT To: Jeralyn Ruths, MD; # Subject: RE:                                            Hey team, Thanks for reaching out. I have 1 last slot on May 16. That is my soonest available at this point in time. Let me know and then I can have my team work on that. GM ----- Message ----- From: [name], DO Sent: 07/05/2022   2:58 PM EDT To: Jeralyn Ruths, MD; Benita Gutter, RN; # Subject: RE:                                            Brain Hilts completed today. No varices. No portal hypertensive features.  Ca19-9 pending Thanks, Elfredia Nevins ----- Message ----- From: Benita Gutter, RN Sent: 07/05/2022   2:52 PM EDT To: Jeralyn Ruths, MD; #  Hi Dr. Meridee Score, would you review his chart and your schedule for availability.  first available is 5/23. [facility] is a  month or so as well. Thank you!  Silva Bandy ----- Message ----- From: [name], DO Sent: 07/03/2022  10:22 AM EDT To: Benita Gutter, RN  Hi Kristi,  I hope this finds you well.  This patient was seen by myself and Dr. Orlie Dakin in the hospital. He has a pancreatic tail lesion appreciated on both CT and MRI. He is in need of endoscopic ultrasound. CA19-9 is pending.  Please let me know if you need additional information.  I will set him up for f/u in our office as well.  Thanks!  Elfredia Nevins

## 2022-07-09 NOTE — Telephone Encounter (Signed)
The pt has been advised of the change in appt date and time. He agrees to the appt on 5/6 and has been re- instructed.

## 2022-07-12 ENCOUNTER — Ambulatory Visit (HOSPITAL_COMMUNITY)
Admission: RE | Admit: 2022-07-12 | Discharge: 2022-07-12 | Disposition: A | Discharge status: Home / Self Care | Payer: Medicare HMO | Attending: Gastroenterology | Admitting: Gastroenterology

## 2022-07-12 ENCOUNTER — Other Ambulatory Visit: Payer: Self-pay

## 2022-07-12 ENCOUNTER — Ambulatory Visit (HOSPITAL_BASED_OUTPATIENT_CLINIC_OR_DEPARTMENT_OTHER): Payer: Medicare HMO | Admitting: Anesthesiology

## 2022-07-12 ENCOUNTER — Ambulatory Visit (HOSPITAL_COMMUNITY): Payer: Medicare HMO | Admitting: Anesthesiology

## 2022-07-12 ENCOUNTER — Encounter (HOSPITAL_COMMUNITY)
Admission: RE | Disposition: A | Discharge status: Home / Self Care | Payer: Self-pay | Source: Home / Self Care | Attending: Gastroenterology

## 2022-07-12 ENCOUNTER — Encounter (HOSPITAL_COMMUNITY): Payer: Self-pay | Admitting: Gastroenterology

## 2022-07-12 DIAGNOSIS — Z79899 Other long term (current) drug therapy: Secondary | ICD-10-CM | POA: Diagnosis not present

## 2022-07-12 DIAGNOSIS — F17210 Nicotine dependence, cigarettes, uncomplicated: Secondary | ICD-10-CM | POA: Insufficient documentation

## 2022-07-12 DIAGNOSIS — K869 Disease of pancreas, unspecified: Secondary | ICD-10-CM | POA: Diagnosis not present

## 2022-07-12 DIAGNOSIS — K2289 Other specified disease of esophagus: Secondary | ICD-10-CM | POA: Diagnosis not present

## 2022-07-12 DIAGNOSIS — K862 Cyst of pancreas: Secondary | ICD-10-CM | POA: Insufficient documentation

## 2022-07-12 DIAGNOSIS — F1120 Opioid dependence, uncomplicated: Secondary | ICD-10-CM | POA: Insufficient documentation

## 2022-07-12 DIAGNOSIS — I899 Noninfective disorder of lymphatic vessels and lymph nodes, unspecified: Secondary | ICD-10-CM | POA: Diagnosis not present

## 2022-07-12 DIAGNOSIS — Z7901 Long term (current) use of anticoagulants: Secondary | ICD-10-CM | POA: Diagnosis not present

## 2022-07-12 DIAGNOSIS — X58XXXA Exposure to other specified factors, initial encounter: Secondary | ICD-10-CM | POA: Diagnosis not present

## 2022-07-12 DIAGNOSIS — R933 Abnormal findings on diagnostic imaging of other parts of digestive tract: Secondary | ICD-10-CM | POA: Diagnosis not present

## 2022-07-12 DIAGNOSIS — K8689 Other specified diseases of pancreas: Secondary | ICD-10-CM

## 2022-07-12 DIAGNOSIS — I10 Essential (primary) hypertension: Secondary | ICD-10-CM | POA: Diagnosis not present

## 2022-07-12 DIAGNOSIS — T182XXA Foreign body in stomach, initial encounter: Secondary | ICD-10-CM | POA: Insufficient documentation

## 2022-07-12 DIAGNOSIS — K3189 Other diseases of stomach and duodenum: Secondary | ICD-10-CM

## 2022-07-12 DIAGNOSIS — K838 Other specified diseases of biliary tract: Secondary | ICD-10-CM | POA: Insufficient documentation

## 2022-07-12 DIAGNOSIS — F17200 Nicotine dependence, unspecified, uncomplicated: Secondary | ICD-10-CM

## 2022-07-12 DIAGNOSIS — C259 Malignant neoplasm of pancreas, unspecified: Secondary | ICD-10-CM | POA: Insufficient documentation

## 2022-07-12 HISTORY — PX: EUS: SHX5427

## 2022-07-12 HISTORY — PX: FINE NEEDLE ASPIRATION: SHX6590

## 2022-07-12 HISTORY — PX: ESOPHAGOGASTRODUODENOSCOPY: SHX5428

## 2022-07-12 LAB — CA 19-9 (SERIAL): CA 19-9: 130 U/mL — ABNORMAL HIGH (ref 0–35)

## 2022-07-12 SURGERY — UPPER ENDOSCOPIC ULTRASOUND (EUS) RADIAL
Anesthesia: Monitor Anesthesia Care

## 2022-07-12 MED ORDER — CIPROFLOXACIN IN D5W 400 MG/200ML IV SOLN
INTRAVENOUS | Status: AC
  Filled 2022-07-12: qty 200

## 2022-07-12 MED ORDER — LACTATED RINGERS IV SOLN: INTRAVENOUS | Status: DC

## 2022-07-12 MED ORDER — GLYCOPYRROLATE 0.2 MG/ML IJ SOLN
INTRAMUSCULAR | Status: DC | PRN
  Administered 2022-07-12: .1 mg via INTRAVENOUS

## 2022-07-12 MED ORDER — APIXABAN 5 MG PO TABS: ORAL_TABLET | ORAL | 0 refills | Status: DC

## 2022-07-12 MED ORDER — PROPOFOL 500 MG/50ML IV EMUL
INTRAVENOUS | Status: DC | PRN
  Administered 2022-07-12: 150 ug/kg/min via INTRAVENOUS

## 2022-07-12 MED ORDER — LIDOCAINE HCL (CARDIAC) PF 100 MG/5ML IV SOSY
PREFILLED_SYRINGE | INTRAVENOUS | Status: DC | PRN
  Administered 2022-07-12: 40 mg via INTRAVENOUS
  Administered 2022-07-12: 20 mg via INTRAVENOUS

## 2022-07-12 MED ORDER — PROPOFOL 10 MG/ML IV BOLUS
INTRAVENOUS | Status: DC | PRN
  Administered 2022-07-12: 40 mg via INTRAVENOUS

## 2022-07-12 MED ORDER — CIPROFLOXACIN HCL 500 MG PO TABS: 500.0000 mg | ORAL_TABLET | Freq: Two times a day (BID) | ORAL | 0 refills | Status: AC

## 2022-07-12 MED ORDER — DEXMEDETOMIDINE HCL IN NACL 80 MCG/20ML IV SOLN
INTRAVENOUS | Status: DC | PRN
  Administered 2022-07-12 (×5): 4 ug via INTRAVENOUS

## 2022-07-12 MED ORDER — CIPROFLOXACIN IN D5W 400 MG/200ML IV SOLN
INTRAVENOUS | Status: DC | PRN
  Administered 2022-07-12: 400 mg via INTRAVENOUS

## 2022-07-12 MED ORDER — SODIUM CHLORIDE 0.9 % IV SOLN: INTRAVENOUS | Status: DC

## 2022-07-12 NOTE — Anesthesia Preprocedure Evaluation (Addendum)
Anesthesia Evaluation  Patient identified by MRN, date of birth, ID band Patient awake    Reviewed: Allergy & Precautions, NPO status , Patient's Chart, lab work & pertinent test results  Airway Mallampati: II  TM Distance: >3 FB Neck ROM: Full    Dental  (+) Loose, Missing, Dental Advisory Given,    Pulmonary Current Smoker and Patient abstained from smoking.   Pulmonary exam normal breath sounds clear to auscultation       Cardiovascular hypertension, Pt. on medications Normal cardiovascular exam Rhythm:Regular Rate:Normal     Neuro/Psych negative neurological ROS  negative psych ROS   GI/Hepatic hiatal hernia,,,(+)     substance abuse (MS Contin, 30mg  QAM, 15mg  QPM; oxycodone 10mg  q4hrs)    Endo/Other  negative endocrine ROS    Renal/GU negative Renal ROS  negative genitourinary   Musculoskeletal  (+) Arthritis ,  narcotic dependent  Abdominal   Peds  Hematology  (+) Blood dyscrasia (Eliquis, last dose Friday 5/3)   Anesthesia Other Findings Pancreatic lesion  Reproductive/Obstetrics                             Anesthesia Physical Anesthesia Plan  ASA: 3  Anesthesia Plan: MAC   Post-op Pain Management:    Induction: Intravenous  PONV Risk Score and Plan: Propofol infusion and Treatment may vary due to age or medical condition  Airway Management Planned: Natural Airway  Additional Equipment:   Intra-op Plan:   Post-operative Plan:   Informed Consent: I have reviewed the patients History and Physical, chart, labs and discussed the procedure including the risks, benefits and alternatives for the proposed anesthesia with the patient or authorized representative who has indicated his/her understanding and acceptance.     Dental advisory given  Plan Discussed with: CRNA  Anesthesia Plan Comments:        Anesthesia Quick Evaluation

## 2022-07-12 NOTE — Discharge Instructions (Signed)
YOU HAD AN ENDOSCOPIC PROCEDURE TODAY: Refer to the procedure report and other information in the discharge instructions given to you for any specific questions about what was found during the examination. If this information does not answer your questions, please call [place] office at [phone] to clarify.  ° °YOU SHOULD EXPECT: Some feelings of bloating in the abdomen. Passage of more gas than usual. Walking can help get rid of the air that was put into your GI tract during the procedure and reduce the bloating. If you had a lower endoscopy (such as a colonoscopy or flexible sigmoidoscopy) you may notice spotting of blood in your stool or on the toilet paper. Some abdominal soreness may be present for a day or two, also. ° °DIET: Your first meal following the procedure should be a light meal and then it is ok to progress to your normal diet. A half-sandwich or bowl of soup is an example of a good first meal. Heavy or fried foods are harder to digest and may make you feel nauseous or bloated. Drink plenty of fluids but you should avoid alcoholic beverages for 24 hours. If you had a esophageal dilation, please see attached instructions for diet.   ° °ACTIVITY: Your care partner should take you home directly after the procedure. You should plan to take it easy, moving slowly for the rest of the day. You can resume normal activity the day after the procedure however YOU SHOULD NOT DRIVE, use power tools, machinery or perform tasks that involve climbing or major physical exertion for 24 hours (because of the sedation medicines used during the test).  ° °SYMPTOMS TO REPORT IMMEDIATELY: °A gastroenterologist can be reached at any hour. Please call [phone]  for any of the following symptoms:  °Following lower endoscopy (colonoscopy, flexible sigmoidoscopy) °Excessive amounts of blood in the stool  °Significant tenderness, worsening of abdominal pains  °Swelling of the abdomen that is new, acute  °Fever of 100° or  higher  °Following upper endoscopy (EGD, EUS, ERCP, esophageal dilation) °Vomiting of blood or coffee ground material  °New, significant abdominal pain  °New, significant chest pain or pain under the shoulder blades  °Painful or persistently difficult swallowing  °New shortness of breath  °Black, tarry-looking or red, bloody stools ° °FOLLOW UP:  °If any biopsies were taken you will be contacted by phone or by letter within the next 1-3 weeks. Call [phone]  if you have not heard about the biopsies in 3 weeks.  °Please also call with any specific questions about appointments or follow up tests. ° °

## 2022-07-12 NOTE — Op Note (Addendum)
[facility] Patient Name: David Harrell Procedure Date: 07/12/2022 MRN: [id number] Attending MD: [name] , MD, [id number] Date of Birth: [birth year] CSN: [id number] Age: 62 Admit Type: Outpatient Procedure:                Upper EUS Indications:              Dilated pancreatic duct on CT scan, Suspected mass                            in pancreas on CT scan, Pancreatic cyst on MRCP,                            Suspected mass in pancreas on MRCP Providers:                [name], MD, Norman Clay, RN, Harrington Challenger,                            Technician Referring MD:             Tollie Pizza. Orlie Dakin, MD, Gracelyn Nurse, MD,                            [name] DO, DO Medicines:                Monitored Anesthesia Care, Cipro 400 mg IV Complications:            No immediate complications. Estimated Blood Loss:     Estimated blood loss was minimal. Procedure:                Pre-Anesthesia Assessment:                           - Prior to the procedure, a History and Physical                            was performed, and patient medications and                            allergies were reviewed. The patient's tolerance of                            previous anesthesia was also reviewed. The risks                            and benefits of the procedure and the sedation                            options and risks were discussed with the patient.                            All questions were answered, and informed consent                            was obtained. Prior Anticoagulants: The patient has  taken Eliquis (apixaban), last dose was 2 days                            prior to procedure. ASA Grade Assessment: III - A                            patient with severe systemic disease. After                            reviewing the risks and benefits, the patient was                            deemed in satisfactory condition  to undergo the                            procedure.                           After obtaining informed consent, the endoscope was                            passed under direct vision. Throughout the                            procedure, the patient's blood pressure, pulse, and                            oxygen saturations were monitored continuously. The                            [id number]-H190 ([id number]) Olympus endoscope was introduced                            through the mouth, and advanced to the second part                            of duodenum. The [id number]-Q190V ([id number]) Olympus                            duodenoscope was introduced through the mouth, and                            advanced to the area of papilla. The GF-[id number]                            ([id number]) Olympus linear ultrasound scope was                            introduced through the mouth, and advanced to the                            duodenum for ultrasound examination from the  stomach and duodenum. The upper EUS was                            accomplished without difficulty. The patient                            tolerated the procedure. Scope In: Scope Out: Findings:      ENDOSCOPIC FINDING: :      No gross lesions were noted in the entire esophagus.      The Z-line was irregular and was found 43 cm from the incisors.      A medium amount of food (residue) was found in the gastric body. Suction       via Endoscope was performed. Removal of solid components of foodstuffs       was accomplished with a Roth net. It was felt safe to continue the       procedure and potential EUS thereafter.      Patchy mildly erythematous mucosa without bleeding was found in the       entire examined stomach.      No gross lesions were noted in the duodenal bulb, in the first portion       of the duodenum and in the second portion of the duodenum.      The major papilla was normal.      ENDOSONOGRAPHIC  FINDING: :      An irregular mass was identified in the pancreatic body/tail region. The       mass was hypoechoic. The mass measured 25 mm by 21 mm in maximal       cross-sectional diameter. The outer margins were irregular. There was       sonographic evidence suggesting invasion into the splenic vein       (manifested by invasion). An intact interface was seen between the mass       and the superior mesenteric artery and celiac trunk suggesting a lack of       invasion. The remainder of the pancreas was examined. The       endosonographic appearance of parenchyma and the upstream pancreatic       duct indicated duct dilation, prominent ductal side-branches, potential       cystic dilation, hyperechoic ductal walls and parenchymal atrophy. The       downstream pancreas was just atrophic with normal appearing pancreatic       duct. Fine needle biopsy was performed of the mass. Color Doppler       imaging was utilized prior to needle puncture to confirm a lack of       significant vascular structures within the needle path. Six passes were       made with the 22 gauge SharkCore biopsy needle using a transgastric       approach. Visible cores of tissue were obtained. Preliminary cytologic       examination and touch preps were performed. Final cytology results are       pending.      There was no sign of significant endosonographic abnormality in the       common bile duct (3.4 mm) and in the common hepatic duct (4.3 mm).      Moderate hyperechoic material consistent with sludge was visualized       endosonographically in the gallbladder.      Endosonographic imaging of the ampulla  showed no intramural       (subepithelial) lesion.      Endosonographic imaging in the visualized portion of the liver showed no       mass.      No malignant-appearing lymph nodes were visualized in the celiac region       (level 20), peripancreatic region and porta hepatis region.      The celiac region was  visualized. Impression:               EGD Impression:                           - No gross lesions in the entire esophagus. Z-line                            irregular, 43 cm from the incisors.                           - A medium amount of food (residue) in the stomach.                            Removal was successful.                           - Erythematous mucosa in the stomach.                           - No gross lesions in the duodenal bulb, in the                            first portion of the duodenum and in the second                            portion of the duodenum.                           - Normal major papilla.                           EUS Impression:                           - A mass was identified in the pancreatic body/tail                            region immediately upstream to this was pretty                            significant pancreatic duct dilation and cystic                            dilation. Downstream of this was a more atrophic                            pancreas.. Cytology results are pending. However,  the endosonographic appearance is highly suspicious                            for adenocarcinoma versus a transformed IPMN. This                            was staged T2 N0 Mx by endosonographic criteria.                            The staging applies if malignancy is confirmed.                            Fine needle biopsy performed of the solid component.                           - There was no sign of significant pathology in the                            common bile duct and in the common hepatic duct.                           - Hyperechoic material consistent with sludge was                            visualized endosonographically in the gallbladder.                           - No malignant-appearing lymph nodes were                            visualized in the celiac region (level 20),                             peripancreatic region and porta hepatis region. Moderate Sedation:      Not Applicable - Patient had care per Anesthesia. Recommendation:           - The patient will be observed post-procedure,                            until all discharge criteria are met.                           - Discharge patient to home.                           - Patient has a contact number available for                            emergencies. The signs and symptoms of potential                            delayed complications were discussed with the  patient. Return to normal activities tomorrow.                            Written discharge instructions were provided to the                            patient.                           - Low fat diet.                           - Observe patient's clinical course.                           - Await cytology results. Will give 3 days worth of                            antibiotics even though we did not touch the cyst                            component this was very adjacent                           - To decrease post interventional infectious risk                            to the the cystic component versus dilated PD                            (immediately adjacent to the solid lesion sample                            today), ciprofloxacin 500 mg twice daily for 3 days.                           - May restart apixaban on 5/8 AM to decrease post                            interventional bleeding risk.                           - Continue present medications otherwise.                           - Monitor for signs/symptoms of bleeding,                            perforation, and infection. If issues please call                            our number to get further assistance as needed.                           - The findings and recommendations  were discussed                            with the patient.                           -  The findings and recommendations were discussed                            with the patient's family. Procedure Code(s):        --- Professional ---                           [phone], Esophagogastroduodenoscopy, flexible,                            transoral; with transendoscopic ultrasound-guided                            intramural or transmural fine needle                            aspiration/biopsy(s), (includes endoscopic                            ultrasound examination limited to the esophagus,                            stomach or duodenum, and adjacent structures)                           [id number], Esophagogastroduodenoscopy, flexible,                            transoral; with removal of foreign body(s) Diagnosis Code(s):        --- Professional ---                           K22.89, Other specified disease of esophagus                           T18.2XXA, Foreign body in stomach, initial encounter                           K31.89, Other diseases of stomach and duodenum                           K86.89, Other specified diseases of pancreas                           I89.9, Noninfective disorder of lymphatic vessels                            and lymph nodes, unspecified                           R93.3, Abnormal findings on diagnostic imaging of  other parts of digestive tract                           K86.2, Cyst of pancreas                           K83.8, Other specified diseases of biliary tract CPT copyright 2022 American [employer] Association. All rights reserved. The codes documented in this report are preliminary and upon coder review may  be revised to meet current compliance requirements. [name], MD 07/12/2022 11:31:55 AM Number of Addenda: 0

## 2022-07-12 NOTE — Transfer of Care (Signed)
Immediate Anesthesia Transfer of Care Note  [name] [facility]  Procedure(s) Performed: UPPER ENDOSCOPIC ULTRASOUND (EUS) RADIAL ESOPHAGOGASTRODUODENOSCOPY (EGD)  Patient Location: PACU and Endoscopy Unit  Anesthesia Type:MAC  Level of Consciousness: drowsy  Airway & Oxygen Therapy: Patient Spontanous Breathing and Patient connected to face mask oxygen  Post-op Assessment: Report given to RN and Post -op Vital signs reviewed and stable  Post vital signs: Reviewed and stable  Last Vitals:  Vitals Value Taken Time  BP 116/69 07/12/22 1120  Temp    Pulse 69 07/12/22 1121  Resp 12 07/12/22 1121  SpO2 100 % 07/12/22 1121  Vitals shown include unvalidated device data.  Last Pain:  Vitals:   07/12/22 0837  TempSrc: Tympanic  PainSc: 6          Complications: No notable events documented.

## 2022-07-12 NOTE — H&P (Signed)
GASTROENTEROLOGY PROCEDURE H&P NOTE   Primary Care Physician: Gracelyn Nurse, MD  HPI: David Harrell is a 62 y.o. male who presents for EGD/EUS to evaluate pancreatic abnormality and pancreatic cyst with ductal dilation and elevated CA19-9.  Past Medical History:  Diagnosis Date   Arthritis    Hiatal hernia    Hypertension    Past Surgical History:  Procedure Laterality Date   ESOPHAGOGASTRODUODENOSCOPY (EGD) WITH PROPOFOL N/A 07/05/2022   Procedure: ESOPHAGOGASTRODUODENOSCOPY (EGD) WITH PROPOFOL;  Surgeon: [name], DO;  Location: [facility] ENDOSCOPY;  Service: Gastroenterology;  Laterality: N/A;   JOINT REPLACEMENT     Current Facility-Administered Medications  Medication Dose Route Frequency Provider Last Rate Last Admin   0.9 %  sodium chloride infusion   Intravenous Continuous Mansouraty, Netty Starring., MD       lactated ringers infusion   Intravenous Continuous Mansouraty, Netty Starring., MD 10 mL/hr at 07/12/22 0853 New Bag at 07/12/22 0853    Current Facility-Administered Medications:    0.9 %  sodium chloride infusion, , Intravenous, Continuous, Mansouraty, Netty Starring., MD   lactated ringers infusion, , Intravenous, Continuous, Mansouraty, Netty Starring., MD, Last Rate: 10 mL/hr at 07/12/22 0853, New Bag at 07/12/22 0853 Allergies  Allergen Reactions   Aspirin     Acid reflux     Pregabalin     Dizziness   Tape     Blisters skin   Codeine Palpitations    headaches   Oxymorphone Rash   Penicillins Hives and Rash   Vicodin [Hydrocodone-Acetaminophen] Palpitations    Headaches    History reviewed. No pertinent family history. Social History   Socioeconomic History   Marital status: Married    Spouse name: Not on file   Number of children: Not on file   Years of education: Not on file   Highest education level: Not on file  Occupational History   Not on file  Tobacco Use   Smoking status: Every Day    Packs/day: 0.50    Years: 40.00    Additional  pack years: 0.00    Total pack years: 20.00    Types: Cigarettes   Smokeless tobacco: Never  Substance and Sexual Activity   Alcohol use: Not Currently   Drug use: Yes    Frequency: 1.0 times per week    Types: Marijuana   Sexual activity: Not on file  Other Topics Concern   Not on file  Social History Narrative   Not on file   Social Determinants of Health   Financial Resource Strain: Not on file  Food Insecurity: No Food Insecurity (07/02/2022)   Hunger Vital Sign    Worried About Running Out of Food in the Last Year: Never true    Ran Out of Food in the Last Year: Never true  Transportation Needs: No Transportation Needs (07/02/2022)   PRAPARE - Administrator, Civil Service (Medical): No    Lack of Transportation (Non-Medical): No  Physical Activity: Not on file  Stress: Not on file  Social Connections: Not on file  Intimate Partner Violence: Not At Risk (07/02/2022)   Humiliation, Afraid, Rape, and Kick questionnaire    Fear of Current or Ex-Partner: No    Emotionally Abused: No    Physically Abused: No    Sexually Abused: No    Physical Exam: Today's Vitals   07/12/22 0837  BP: (!) 128/90  Pulse: 64  Resp: 19  Temp: (!) 97.4 F (36.3 C)  TempSrc:  Tympanic  SpO2: 99%  Weight: 63.5 kg  Height: 5\' 8"  (1.727 m)  PainSc: 6    Body mass index is 21.29 kg/m. GEN: NAD EYE: Sclerae anicteric ENT: MMM CV: Non-tachycardic GI: Soft, NT/ND NEURO:  Alert & Oriented x 3  Lab Results: No results for input(s): "WBC", "HGB", "HCT", "PLT" in the last 72 hours. BMET No results for input(s): "NA", "K", "CL", "CO2", "GLUCOSE", "BUN", "CREATININE", "CALCIUM" in the last 72 hours. LFT No results for input(s): "PROT", "ALBUMIN", "AST", "ALT", "ALKPHOS", "BILITOT", "BILIDIR", "IBILI" in the last 72 hours. PT/INR No results for input(s): "LABPROT", "INR" in the last 72 hours.   Impression / Plan: This is a 62 y.o.male who presents for EGD/EUS to evaluate  pancreatic abnormality and pancreatic cyst with ductal dilation and elevated CA19-9.  The risks of an EUS including intestinal perforation, bleeding, infection, aspiration, and medication effects were discussed as was the possibility it may not give a definitive diagnosis if a biopsy is performed.  When a biopsy of the pancreas is done as part of the EUS, there is an additional risk of pancreatitis at the rate of about 1-2%.  It was explained that procedure related pancreatitis is typically mild, although it can be severe and even life threatening, which is why we do not perform random pancreatic biopsies and only biopsy a lesion/area we feel is concerning enough to warrant the risk.   The risks and benefits of endoscopic evaluation/treatment were discussed with the patient and/or family; these include but are not limited to the risk of perforation, infection, bleeding, missed lesions, lack of diagnosis, severe illness requiring hospitalization, as well as anesthesia and sedation related illnesses.  The patient's history has been reviewed, patient examined, no change in status, and deemed stable for procedure.  The patient and/or family is agreeable to proceed.    [name], MD [facility] Advanced [facility]oscopy Office # [phone]

## 2022-07-12 NOTE — Anesthesia Postprocedure Evaluation (Signed)
Anesthesia Post Note  [name] [facility]  Procedure(s) Performed: UPPER ENDOSCOPIC ULTRASOUND (EUS) RADIAL ESOPHAGOGASTRODUODENOSCOPY (EGD) FINE NEEDLE ASPIRATION     Patient location during evaluation: Endoscopy Anesthesia Type: MAC Level of consciousness: awake and alert Pain management: pain level controlled Vital Signs Assessment: post-procedure vital signs reviewed and stable Respiratory status: spontaneous breathing, nonlabored ventilation, respiratory function stable and patient connected to nasal cannula oxygen Cardiovascular status: blood pressure returned to baseline and stable Postop Assessment: no apparent nausea or vomiting Anesthetic complications: no  No notable events documented.  Last Vitals:  Vitals:   07/12/22 1150 07/12/22 1200  BP: (!) 189/91 (!) 150/86  Pulse: (!) 57 (!) 55  Resp: 13 16  Temp:    SpO2: 99% 100%    Last Pain:  Vitals:   07/12/22 1140  TempSrc:   PainSc: Asleep                 Menaal Russum L Jennessa Trigo

## 2022-07-13 ENCOUNTER — Telehealth: Payer: Self-pay

## 2022-07-13 LAB — CYTOLOGY - NON PAP

## 2022-07-13 NOTE — Telephone Encounter (Signed)
----- Message from Jeralyn Ruths, MD sent at 07/13/2022  7:27 AM EDT ----- Regarding: RE: Thank you!  ----- Message ----- From: Lemar Lofty., MD Sent: 07/09/2022   1:39 PM EDT To: Jeralyn Ruths, MD; # Subject: RE:                                            Alexia Freestone, Please see if we can offer this patient the procedure slot that has just opened up for next week. He is on anticoagulation, but we have the OK from Hematology to hold it for the procedure he has scheduled for 5/16. If the date/time works for patient, please offer him the slot for next week. Otherwise, keep the current date. GM  FYI TJF and SMR and KDS, will see if we can get patient in sooner to understand cyst v mass v both.  ----- Message ----- From: Jeralyn Ruths, MD Sent: 07/08/2022   3:59 PM EDT To: [name], DO; Benita Gutter, RN; # Subject: RE:                                            Thanks!  ----- Message ----- From: [name], DO Sent: 07/08/2022   2:44 PM EDT To: Jeralyn Ruths, MD; Benita Gutter, RN; # Subject: RE:                                            I called the lab since it was taking a while- CA 19-9 is 130 (on 4/26; ULN for this lab is 35)   ----- Message ----- From: Lemar Lofty., MD Sent: 07/06/2022   1:05 PM EDT To: Jeralyn Ruths, MD; # Subject: RE:                                            Hey team, Thanks for reaching out. I have 1 last slot on May 16. That is my soonest available at this point in time. Let me know and then I can have my team work on that. GM ----- Message ----- From: [name], DO Sent: 07/05/2022   2:58 PM EDT To: Jeralyn Ruths, MD; Benita Gutter, RN; # Subject: RE:                                            Brain Hilts completed today. No varices. No portal hypertensive features.  Ca19-9 pending Thanks, Elfredia Nevins ----- Message ----- From: Benita Gutter, RN Sent:  07/05/2022   2:52 PM EDT To: Jeralyn Ruths, MD; #  Hi Dr. Meridee Score, would you review his chart and your schedule for availability. [place] first available is 5/23. [facility] is a month or so as well. Thank you!  Silva Bandy ----- Message ----- From: [name], DO Sent: 07/03/2022  10:22 AM EDT To: Benita Gutter, RN  Hi Kristi,  I hope this finds you well.  This patient was seen by myself and Dr. Orlie Dakin in the hospital. He has a pancreatic tail lesion appreciated on both CT and MRI. He is in need of endoscopic ultrasound. CA19-9 is pending.  Please let me know if you need additional information.  I will set him up for f/u in our office as well.  Thanks! Elfredia Nevins

## 2022-07-13 NOTE — Telephone Encounter (Signed)
The pt procedure was completed on 5/6.

## 2022-07-14 ENCOUNTER — Encounter: Payer: Self-pay | Admitting: Gastroenterology

## 2022-07-14 ENCOUNTER — Telehealth: Payer: Self-pay

## 2022-07-14 NOTE — Telephone Encounter (Signed)
Spoke with Mr. Tracey and spouse, Cecelia. Briefly provided results of biopsy. Appointment arranged with Dr. Orlie Dakin 07/16/22 at 1000. Directions to the cancer center provided.

## 2022-07-15 ENCOUNTER — Encounter (HOSPITAL_COMMUNITY): Payer: Self-pay | Admitting: Gastroenterology

## 2022-07-16 ENCOUNTER — Inpatient Hospital Stay: Payer: Medicare HMO | Attending: Oncology | Admitting: Oncology

## 2022-07-16 ENCOUNTER — Inpatient Hospital Stay (HOSPITAL_BASED_OUTPATIENT_CLINIC_OR_DEPARTMENT_OTHER): Payer: Medicare HMO | Admitting: Hospice and Palliative Medicine

## 2022-07-16 ENCOUNTER — Encounter: Payer: Self-pay | Admitting: Oncology

## 2022-07-16 ENCOUNTER — Inpatient Hospital Stay: Payer: Medicare HMO

## 2022-07-16 VITALS — BP 131/83 | HR 75 | Temp 97.7°F | Wt 150.0 lb

## 2022-07-16 DIAGNOSIS — C252 Malignant neoplasm of tail of pancreas: Secondary | ICD-10-CM | POA: Diagnosis not present

## 2022-07-16 DIAGNOSIS — G893 Neoplasm related pain (acute) (chronic): Secondary | ICD-10-CM

## 2022-07-16 DIAGNOSIS — Z79899 Other long term (current) drug therapy: Secondary | ICD-10-CM | POA: Diagnosis not present

## 2022-07-16 DIAGNOSIS — C259 Malignant neoplasm of pancreas, unspecified: Secondary | ICD-10-CM | POA: Insufficient documentation

## 2022-07-16 DIAGNOSIS — K8689 Other specified diseases of pancreas: Secondary | ICD-10-CM

## 2022-07-16 DIAGNOSIS — G8929 Other chronic pain: Secondary | ICD-10-CM | POA: Diagnosis not present

## 2022-07-16 DIAGNOSIS — F1290 Cannabis use, unspecified, uncomplicated: Secondary | ICD-10-CM | POA: Insufficient documentation

## 2022-07-16 DIAGNOSIS — R1010 Upper abdominal pain, unspecified: Secondary | ICD-10-CM | POA: Insufficient documentation

## 2022-07-16 DIAGNOSIS — M549 Dorsalgia, unspecified: Secondary | ICD-10-CM | POA: Insufficient documentation

## 2022-07-16 DIAGNOSIS — K861 Other chronic pancreatitis: Secondary | ICD-10-CM | POA: Diagnosis not present

## 2022-07-16 DIAGNOSIS — F17210 Nicotine dependence, cigarettes, uncomplicated: Secondary | ICD-10-CM | POA: Insufficient documentation

## 2022-07-16 LAB — CBC WITH DIFFERENTIAL/PLATELET
Abs Immature Granulocytes: 0.02 10*3/uL (ref 0.00–0.07)
Basophils Absolute: 0.1 10*3/uL (ref 0.0–0.1)
Basophils Relative: 1 %
Eosinophils Absolute: 0.2 10*3/uL (ref 0.0–0.5)
Eosinophils Relative: 3 %
HCT: 41.9 % (ref 39.0–52.0)
Hemoglobin: 13.7 g/dL (ref 13.0–17.0)
Immature Granulocytes: 0 %
Lymphocytes Relative: 30 %
Lymphs Abs: 2.3 10*3/uL (ref 0.7–4.0)
MCH: 29.2 pg (ref 26.0–34.0)
MCHC: 32.7 g/dL (ref 30.0–36.0)
MCV: 89.3 fL (ref 80.0–100.0)
Monocytes Absolute: 0.5 10*3/uL (ref 0.1–1.0)
Monocytes Relative: 6 %
Neutro Abs: 4.5 10*3/uL (ref 1.7–7.7)
Neutrophils Relative %: 60 %
Platelets: 212 10*3/uL (ref 150–400)
RBC: 4.69 MIL/uL (ref 4.22–5.81)
RDW: 13.2 % (ref 11.5–15.5)
WBC: 7.6 10*3/uL (ref 4.0–10.5)
nRBC: 0 % (ref 0.0–0.2)

## 2022-07-16 LAB — CMP (CANCER CENTER ONLY)
ALT: 15 U/L (ref 0–44)
AST: 17 U/L (ref 15–41)
Albumin: 4.4 g/dL (ref 3.5–5.0)
Alkaline Phosphatase: 57 U/L (ref 38–126)
Anion gap: 11 (ref 5–15)
BUN: 11 mg/dL (ref 6–20)
CO2: 26 mmol/L (ref 22–32)
Calcium: 8.8 mg/dL — ABNORMAL LOW (ref 8.9–10.3)
Chloride: 101 mmol/L (ref 98–111)
Creatinine: 0.78 mg/dL (ref 0.61–1.24)
GFR, Estimated: >60 mL/min (ref >60)
Glucose, Bld: 117 mg/dL — ABNORMAL HIGH (ref 70–99)
Potassium: 3.7 mmol/L (ref 3.5–5.1)
Sodium: 138 mmol/L (ref 135–145)
Total Bilirubin: 0.5 mg/dL (ref 0.3–1.2)
Total Protein: 7.9 g/dL (ref 6.5–8.1)

## 2022-07-16 MED ORDER — MORPHINE SULFATE ER 30 MG PO TBCR: 30.0000 mg | EXTENDED_RELEASE_TABLET | Freq: Two times a day (BID) | ORAL | 0 refills | Status: DC

## 2022-07-16 NOTE — Progress Notes (Signed)
[facility]  Telephone:([phone]) [phone] Fax:(336) [phone]  ID: David Harrell OB: 19-Jan-1961  MR#: [id number]  VHQ#:[id number]  Patient Care Team: Gracelyn Nurse, MD as PCP - General (Internal Medicine) Benita Gutter, RN as Oncology Nurse Navigator  CHIEF COMPLAINT: Stage IIa adenocarcinoma the pancreas.  INTERVAL HISTORY: Patient returns to clinic today for hospital follow-up and discussion of his pathology results.  He recently underwent EUS confirming diagnosis.  He has increased abdominal pain, but otherwise feels well.  He has no neurologic complaints.  He denies any recent fevers or illnesses.  He has a good appetite and denies weight loss.  He has no chest pain, shortness of breath, cough, or hemoptysis.  He denies any nausea, vomiting, constipation, or diarrhea.  He has no urinary complaints.  Patient offers no further specific complaints today.  REVIEW OF SYSTEMS:   Review of Systems  Constitutional: Negative.  Negative for fever, malaise/fatigue and weight loss.  Respiratory: Negative.  Negative for cough, hemoptysis and shortness of breath.   Cardiovascular: Negative.  Negative for chest pain and leg swelling.  Gastrointestinal:  Positive for abdominal pain.  Genitourinary: Negative.  Negative for dysuria.  Musculoskeletal: Negative.  Negative for back pain.  Skin: Negative.  Negative for rash.  Neurological: Negative.  Negative for dizziness, focal weakness, weakness and headaches.  Psychiatric/Behavioral: Negative.  The patient is not nervous/anxious.     As per HPI. Otherwise, a complete review of systems is negative.  PAST MEDICAL HISTORY: Past Medical History:  Diagnosis Date   Arthritis    Hiatal hernia    Hypertension     PAST SURGICAL HISTORY: Past Surgical History:  Procedure Laterality Date   ESOPHAGOGASTRODUODENOSCOPY N/A 07/12/2022   Procedure: ESOPHAGOGASTRODUODENOSCOPY (EGD);  Surgeon: Lemar Lofty., MD;  Location: [facility]  ENDOSCOPY;  Service: Gastroenterology;  Laterality: N/A;   ESOPHAGOGASTRODUODENOSCOPY (EGD) WITH PROPOFOL N/A 07/05/2022   Procedure: ESOPHAGOGASTRODUODENOSCOPY (EGD) WITH PROPOFOL;  Surgeon: [name], DO;  Location: [facility] ENDOSCOPY;  Service: Gastroenterology;  Laterality: N/A;   EUS N/A 07/12/2022   Procedure: UPPER ENDOSCOPIC ULTRASOUND (EUS) RADIAL;  Surgeon: Lemar Lofty., MD;  Location: [facility] ENDOSCOPY;  Service: Gastroenterology;  Laterality: N/A;   FINE NEEDLE ASPIRATION  07/12/2022   Procedure: FINE NEEDLE ASPIRATION;  Surgeon: Lemar Lofty., MD;  Location: [facility] ENDOSCOPY;  Service: Gastroenterology;;   JOINT REPLACEMENT      FAMILY HISTORY: Family History  Problem Relation Age of Onset   Liver cancer Sister    Spina bifida Paternal Grandfather     ADVANCED DIRECTIVES (Y/N):  N  HEALTH MAINTENANCE: Social History   Tobacco Use   Smoking status: Every Day    Packs/day: 0.50    Years: 40.00    Additional pack years: 0.00    Total pack years: 20.00    Types: Cigarettes   Smokeless tobacco: Never  Substance Use Topics   Alcohol use: Not Currently   Drug use: Yes    Frequency: 1.0 times per week    Types: Marijuana     Colonoscopy:  PAP:  Bone density:  Lipid panel:  Allergies  Allergen Reactions   Aspirin     Acid reflux     Pregabalin     Dizziness   Tape     Blisters skin   Codeine Palpitations    headaches   Oxymorphone Rash   Penicillins Hives and Rash   Vicodin [Hydrocodone-Acetaminophen] Palpitations    Headaches     Current Outpatient Medications  Medication Sig Dispense Refill   ALPRAZolam (XANAX) 1 MG tablet Take 1 mg by mouth 2 (two) times daily.     apixaban (ELIQUIS) 5 MG TABS tablet Take 2 tablets (10 mg total) by mouth 2 (two) times daily for 7 days, THEN 1 tablet (5 mg total) 2 (two) times daily for 23 days. HOLD THIS MEDICATION FOR 2 DAYS PRIOR TO YOUR BIOPSY PROCEDURE. 74 tablet 0   budesonide-formoterol  (SYMBICORT) 160-4.5 MCG/ACT inhaler Inhale 2 puffs into the lungs 2 (two) times daily as needed (shortness of breath).     lisinopril (ZESTRIL) 20 MG tablet Take 20 mg by mouth daily.     meloxicam (MOBIC) 15 MG tablet Take 15 mg by mouth daily.     morphine (MS CONTIN) 15 MG 12 hr tablet Take 15 mg by mouth every evening.     morphine (MS CONTIN) 30 MG 12 hr tablet Take 30 mg by mouth daily after breakfast.     ondansetron (ZOFRAN) 4 MG tablet Take 1 tablet (4 mg total) by mouth every 6 (six) hours as needed for nausea. 20 tablet 0   Oxycodone HCl 10 MG TABS Take 1 tablet by mouth every 4 (four) hours as needed.     pantoprazole (PROTONIX) 40 MG tablet Take 1 tablet (40 mg total) by mouth 2 (two) times daily. 60 tablet 0   rosuvastatin (CRESTOR) 20 MG tablet Take 20 mg by mouth daily.     No current facility-administered medications for this visit.    OBJECTIVE: Vitals:   07/16/22 1012  BP: 131/83  Pulse: 75  Temp: 97.7 F (36.5 C)  SpO2: 96%     Body mass index is 22.81 kg/m.    ECOG FS:1 - Symptomatic but completely ambulatory  General: Well-developed, well-nourished, no acute distress. Eyes: Pink conjunctiva, anicteric sclera. HEENT: Normocephalic, moist mucous membranes. Lungs: No audible wheezing or coughing. Heart: Regular rate and rhythm. Abdomen: Soft, nontender, no obvious distention. Musculoskeletal: No edema, cyanosis, or clubbing. Neuro: Alert, answering all questions appropriately. Cranial nerves grossly intact. Skin: No rashes or petechiae noted. Psych: Normal affect. Lymphatics: No cervical, calvicular, axillary or inguinal LAD.   LAB RESULTS:  Lab Results  Component Value Date   NA 140 07/07/2022   K 4.0 07/07/2022   CL 107 07/07/2022   CO2 29 07/07/2022   GLUCOSE 118 (H) 07/07/2022   BUN 10 07/07/2022   CREATININE 0.83 07/07/2022   CALCIUM 8.5 (L) 07/07/2022   PROT 6.4 (L) 07/03/2022   ALBUMIN 3.5 07/03/2022   AST 14 (L) 07/03/2022   ALT 15  07/03/2022   ALKPHOS 51 07/03/2022   BILITOT 0.6 07/03/2022   GFRNONAA >60 07/07/2022   GFRAA >60 04/13/2018    Lab Results  Component Value Date   WBC 7.2 07/07/2022   NEUTROABS 3.7 07/07/2022   HGB 12.5 (L) 07/07/2022   HCT 37.4 (L) 07/07/2022   MCV 87.2 07/07/2022   PLT 156 07/07/2022     STUDIES: MR ABDOMEN W WO CONTRAST  Result Date: 07/03/2022 CLINICAL DATA:  Pancreatic tail lesion on CT scan earlier same day. EXAM: MRI ABDOMEN WITHOUT AND WITH CONTRAST TECHNIQUE: Multiplanar multisequence MR imaging of the abdomen was performed both before and after the administration of intravenous contrast. CONTRAST:  7mL GADAVIST GADOBUTROL 1 MMOL/ML IV SOLN COMPARISON:  CT scan from earlier same day. FINDINGS: Lower chest: Unremarkable. Hepatobiliary: No suspicious focal abnormality in the liver parenchyma. Mild intrahepatic biliary duct prominence. No evidence for gallstones. No common bile  duct dilatation (5 mm diameter in the head of the pancreas. No evidence for choledocholithiasis Pancreas: 4.4 x 3.1 x 2.8 cm multilocular mass lesion is identified in the tail of pancreas. There is atrophy in the tip of the pancreas with associated main duct dilatation. After IV contrast administration there is enhancement in the periphery of the lesion with enhancing smooth internal septations. Spleen:  No splenomegaly. No focal mass lesion. Adrenals/Urinary Tract: No adrenal nodule or mass. Right kidney unremarkable. 1.7 cm simple cyst noted upper pole left kidney. No followup imaging is recommended. Stomach/Bowel: Stomach is unremarkable. No gastric wall thickening. No evidence of outlet obstruction. Duodenum is normally positioned as is the ligament of Treitz. No small bowel or colonic dilatation within the visualized abdomen. Vascular/Lymphatic: No abdominal aortic aneurysm. No abdominal lymphadenopathy. 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 Other:  No intraperitoneal free fluid. Musculoskeletal: No focal  suspicious marrow enhancement within the visualized bony anatomy. Artifact from lower lumbar fixation hardware evident. IMPRESSION: 1. 4.4 x 3.1 x 2.8 cm multilocular mass lesion in the tail of the pancreas with associated main duct dilatation and parenchymal atrophy in the tip of the pancreatic tail. Mucinous cystic neoplasm, IPMN, and pseudocyst could all have this appearance although mucinous cystic neoplasm favored based on imaging. Endoscopic ultrasound recommended to further evaluate. 2. 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 3. Mild intrahepatic biliary duct dilatation. No common bile duct dilatation. No evidence for choledocholithiasis. Electronically Signed   By: Kennith Center M.D.   On: 07/03/2022 08:47   MR 3D Recon At Scanner  Result Date: 07/03/2022 CLINICAL DATA:  Pancreatic tail lesion on CT scan earlier same day. EXAM: MRI ABDOMEN WITHOUT AND WITH CONTRAST TECHNIQUE: Multiplanar multisequence MR imaging of the abdomen was performed both before and after the administration of intravenous contrast. CONTRAST:  7mL GADAVIST GADOBUTROL 1 MMOL/ML IV SOLN COMPARISON:  CT scan from earlier same day. FINDINGS: Lower chest: Unremarkable. Hepatobiliary: No suspicious focal abnormality in the liver parenchyma. Mild intrahepatic biliary duct prominence. No evidence for gallstones. No common bile duct dilatation (5 mm diameter in the head of the pancreas. No evidence for choledocholithiasis Pancreas: 4.4 x 3.1 x 2.8 cm multilocular mass lesion is identified in the tail of pancreas. There is atrophy in the tip of the pancreas with associated main duct dilatation. After IV contrast administration there is enhancement in the periphery of the lesion with enhancing smooth internal septations. Spleen:  No splenomegaly. No focal mass lesion. Adrenals/Urinary Tract: No adrenal nodule or mass. Right kidney unremarkable. 1.7 cm simple cyst noted upper pole left kidney. No followup imaging is recommended. Stomach/Bowel:  Stomach is unremarkable. No gastric wall thickening. No evidence of outlet obstruction. Duodenum is normally positioned as is the ligament of Treitz. No small bowel or colonic dilatation within the visualized abdomen. Vascular/Lymphatic: No abdominal aortic aneurysm. No abdominal lymphadenopathy. 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 Other:  No intraperitoneal free fluid. Musculoskeletal: No focal suspicious marrow enhancement within the visualized bony anatomy. Artifact from lower lumbar fixation hardware evident. IMPRESSION: 1. 4.4 x 3.1 x 2.8 cm multilocular mass lesion in the tail of the pancreas with associated main duct dilatation and parenchymal atrophy in the tip of the pancreatic tail. Mucinous cystic neoplasm, IPMN, and pseudocyst could all have this appearance although mucinous cystic neoplasm favored based on imaging. Endoscopic ultrasound recommended to further evaluate. 2. 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 3. Mild intrahepatic biliary duct dilatation. No common bile duct dilatation. No evidence for choledocholithiasis. Electronically Signed   By:  Kennith Center M.D.   On: 07/03/2022 08:47   CT ABDOMEN PELVIS W CONTRAST  Result Date: 07/02/2022 CLINICAL DATA:  Left-sided abdominal pain, suspected pancreatitis. EXAM: CT ABDOMEN AND PELVIS WITH CONTRAST TECHNIQUE: Multidetector CT imaging of the abdomen and pelvis was performed using the standard protocol following bolus administration of intravenous contrast. RADIATION DOSE REDUCTION: This exam was performed according to the departmental dose-optimization program which includes automated exposure control, adjustment of the mA and/or kV according to patient size and/or use of iterative reconstruction technique. CONTRAST:  OMNIPAQUE IOHEXOL 300 MG/ML  SOLN COMPARISON:  04/07/2022 FINDINGS: Lower chest: Unremarkable Hepatobiliary: Stable 6 mm cyst in segment 2 of the liver. Borderline intrahepatic biliary dilatation. Gallbladder unremarkable. No  extrahepatic biliary dilatation. Pancreas: In the pancreatic tail the site of prior mild hypoenhancement and inflammation, there is a larger region of hypoenhancement measuring 3.8 by 3.0 cm on image 18 series 2, and faintly reduced enhancement further distally in the pancreatic tail is well. Moreover, there is a bilobed cystic lesion within the pancreas measuring 2.6 by 1.6 by 2.0 cm. This cystic lesion does not have substantial peripheral enhancement and accordingly I favor a pseudocyst over abscess. In this setting, underlying pancreatic adenocarcinoma is difficult to totally exclude. Although a substantial portion of the pancreatic tail is hypoenhancing, we do not demonstrate lack of enhancement to suggest pancreatic necrosis. Pancreatic body and head appear normal. Mild stranding in the adipose tissues around the inflamed portion of the pancreas. Spleen: Unremarkable Adrenals/Urinary Tract: No clinically significant findings. The median lobe of the prostate gland indents the bladder base. 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 Atherosclerosis is present, including aortoiliac atherosclerotic disease. No pathologic adenopathy. Reproductive: Mild prostatomegaly. Other: No supplemental non-categorized findings. Musculoskeletal: Posterolateral rod and pedicle screw fixation with interbody fusion at L5-S1. Posterior decompression at L5-S1. IMPRESSION: 1. Enlarging region of hypoenhancement in the pancreatic tail at the site of prior mild hypoenhancement and inflammation. New bilobed cystic lesion centrally within this hypoenhancing region measuring 2.6 by 1.6 by 2.0 cm (volume = 4.4 cm^3). Although the appearance favors a pseudocyst over abscess, the possibility of underlying pancreatic adenocarcinoma with secondary inflammation is difficult to totally  exclude. Assuming typical clinical presentation and scenario for pancreatitis, follow up pancreatic protocol MRI with and without contrast is recommended in 2-3 months time to reassess the region and ensure expected improvement. If the clinical scenario is atypical for pancreatitis, then a more aggressive course for potential sampling of the pancreatic tail should be considered. 2. 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 3. Borderline intrahepatic biliary dilatation. 4. Mild prostatomegaly. 5. Prior fusion at L5-S1. 6. Aortic atherosclerosis. Aortic Atherosclerosis (ICD10-I70.0). Electronically Signed   By: Gaylyn Rong M.D.   On: 07/02/2022 18:05    ASSESSMENT: Stage IIa adenocarcinoma the pancreas.  PLAN:    Stage IIa adenocarcinoma the pancreas: Appreciate GI input.  EUS on Jul 12, 2022 confirmed diagnosis.  MRI results from July 03, 2022 reviewed independently and report as above with a 4.4 cm lesion at the tail of the pancreas.  No obvious metastatic lesions were noted.  Patient's CA 19-9 is 130.  Will get a PET scan to complete the staging workup.  Will also refer patient to surgery in [facility] for discussion of resection.  Follow-up will be  based on PET scan results. Pain: Patient has been on chronic narcotics for greater than 20 years.  Currently on MS Contin with as needed oxycodone.  Appreciate palliative care input.  I spent a total of 30 minutes reviewing chart data, face-to-face evaluation with the patient, counseling and coordination of care as detailed above.   Patient expressed understanding and was in agreement with this plan. He also understands that He can call clinic at any time with any questions, concerns, or complaints.    Cancer Staging  Pancreatic adenocarcinoma [facility]) Staging form: Exocrine Pancreas, AJCC 8th Edition - Clinical stage from 07/16/2022: Stage  IIA (cT3, cN0, cM0) - Signed by Jeralyn Ruths, MD on 07/16/2022 Stage prefix: Initial diagnosis Total positive nodes: 0   Jeralyn Ruths, MD   07/16/2022 10:45 AM

## 2022-07-16 NOTE — Progress Notes (Signed)
Palliative Medicine [facility] at [facility] Telephone:([phone]) [phone] Fax:(336) [phone]   Name: David Harrell Date: 07/16/2022 MRN: [id number]  DOB: 06-Jul-1960  Patient Care Team: Gracelyn Nurse, MD as PCP - General (Internal Medicine) Benita Gutter, RN as Oncology Nurse Navigator    REASON FOR CONSULTATION: David Harrell is a 62 y.o. male with multiple medical problems including history of pancreatitis with subsequent hospitalization for continued abdominal pain and found to have a pancreatic tail mass.  Patient has history of chronic back pain for which she is on MS Contin and oxycodone.  He was referred to palliative care to address goals and manage ongoing symptoms.  SOCIAL HISTORY:     reports that he has been smoking cigarettes. He has a 20.00 pack-year smoking history. He has never used smokeless tobacco. He reports that he does not currently use alcohol. He reports current drug use. Frequency: 1.00 time per week. Drug: Marijuana.  Patient is married and lives at home with his wife.  ADVANCE DIRECTIVES:  Does not have  CODE STATUS:   PAST MEDICAL HISTORY: Past Medical History:  Diagnosis Date   Arthritis    Hiatal hernia    Hypertension     PAST SURGICAL HISTORY:  Past Surgical History:  Procedure Laterality Date   ESOPHAGOGASTRODUODENOSCOPY N/A 07/12/2022   Procedure: ESOPHAGOGASTRODUODENOSCOPY (EGD);  Surgeon: Lemar Lofty., MD;  Location: [facility] ENDOSCOPY;  Service: Gastroenterology;  Laterality: N/A;   ESOPHAGOGASTRODUODENOSCOPY (EGD) WITH PROPOFOL N/A 07/05/2022   Procedure: ESOPHAGOGASTRODUODENOSCOPY (EGD) WITH PROPOFOL;  Surgeon: [name], DO;  Location: [facility] ENDOSCOPY;  Service: Gastroenterology;  Laterality: N/A;   EUS N/A 07/12/2022   Procedure: UPPER ENDOSCOPIC ULTRASOUND (EUS) RADIAL;  Surgeon: Lemar Lofty., MD;  Location: [facility] ENDOSCOPY;  Service: Gastroenterology;  Laterality: N/A;   FINE NEEDLE  ASPIRATION  07/12/2022   Procedure: FINE NEEDLE ASPIRATION;  Surgeon: Meridee Score, Netty Starring., MD;  Location: [facility] ENDOSCOPY;  Service: Gastroenterology;;   JOINT REPLACEMENT      HEMATOLOGY/ONCOLOGY HISTORY:  Oncology History  Pancreatic adenocarcinoma (HCC)  07/16/2022 Initial Diagnosis   Pancreatic adenocarcinoma (HCC)   07/16/2022 Cancer Staging   Staging form: Exocrine Pancreas, AJCC 8th Edition - Clinical stage from 07/16/2022: Stage IIA (cT3, cN0, cM0) - Signed by Jeralyn Ruths, MD on 07/16/2022 Stage prefix: Initial diagnosis Total positive nodes: 0     ALLERGIES:  is allergic to aspirin, pregabalin, tape, codeine, oxymorphone, penicillins, and vicodin [hydrocodone-acetaminophen].  MEDICATIONS:  Current Outpatient Medications  Medication Sig Dispense Refill   ALPRAZolam (XANAX) 1 MG tablet Take 1 mg by mouth 2 (two) times daily.     apixaban (ELIQUIS) 5 MG TABS tablet Take 2 tablets (10 mg total) by mouth 2 (two) times daily for 7 days, THEN 1 tablet (5 mg total) 2 (two) times daily for 23 days. HOLD THIS MEDICATION FOR 2 DAYS PRIOR TO YOUR BIOPSY PROCEDURE. 74 tablet 0   budesonide-formoterol (SYMBICORT) 160-4.5 MCG/ACT inhaler Inhale 2 puffs into the lungs 2 (two) times daily as needed (shortness of breath).     lisinopril (ZESTRIL) 20 MG tablet Take 20 mg by mouth daily.     meloxicam (MOBIC) 15 MG tablet Take 15 mg by mouth daily.     morphine (MS CONTIN) 15 MG 12 hr tablet Take 15 mg by mouth every evening.     morphine (MS CONTIN) 30 MG 12 hr tablet Take 30 mg by mouth daily after breakfast.     ondansetron (  ZOFRAN) 4 MG tablet Take 1 tablet (4 mg total) by mouth every 6 (six) hours as needed for nausea. 20 tablet 0   Oxycodone HCl 10 MG TABS Take 1 tablet by mouth every 4 (four) hours as needed.     pantoprazole (PROTONIX) 40 MG tablet Take 1 tablet (40 mg total) by mouth 2 (two) times daily. 60 tablet 0   rosuvastatin (CRESTOR) 20 MG tablet Take 20 mg by mouth daily.      No current facility-administered medications for this visit.    VITAL SIGNS: There were no vitals taken for this visit. There were no vitals filed for this visit.  Estimated body mass index is 22.81 kg/m as calculated from the following:   Height as of 07/12/22: 5\' 8"  (1.727 m).   Weight as of an earlier encounter on 07/16/22: 150 lb (68 kg).  LABS: CBC:    Component Value Date/Time   WBC 7.2 07/07/2022 0635   HGB 12.5 (L) 07/07/2022 0635   HGB 15.3 06/30/2014 1750   HCT 37.4 (L) 07/07/2022 0635   HCT 44.3 06/30/2014 1750   PLT 156 07/07/2022 0635   PLT 226 06/30/2014 1750   MCV 87.2 07/07/2022 0635   MCV 88 06/30/2014 1750   NEUTROABS 3.7 07/07/2022 0635   LYMPHSABS 2.6 07/07/2022 0635   MONOABS 0.6 07/07/2022 0635   EOSABS 0.3 07/07/2022 0635   BASOSABS 0.1 07/07/2022 0635   Comprehensive Metabolic Panel:    Component Value Date/Time   NA 140 07/07/2022 0635   NA 139 06/30/2014 1750   K 4.0 07/07/2022 0635   K 3.3 (L) 06/30/2014 1750   CL 107 07/07/2022 0635   CL 105 06/30/2014 1750   CO2 29 07/07/2022 0635   CO2 20 (L) 06/30/2014 1750   BUN 10 07/07/2022 0635   BUN 10 06/30/2014 1750   CREATININE 0.83 07/07/2022 0635   CREATININE 0.85 06/30/2014 1750   GLUCOSE 118 (H) 07/07/2022 0635   GLUCOSE 115 (H) 06/30/2014 1750   CALCIUM 8.5 (L) 07/07/2022 0635   CALCIUM 8.9 06/30/2014 1750   AST 14 (L) 07/03/2022 0533   ALT 15 07/03/2022 0533   ALKPHOS 51 07/03/2022 0533   BILITOT 0.6 07/03/2022 0533   PROT 6.4 (L) 07/03/2022 0533   ALBUMIN 3.5 07/03/2022 0533    RADIOGRAPHIC STUDIES: MR ABDOMEN W WO CONTRAST  Result Date: 07/03/2022 CLINICAL DATA:  Pancreatic tail lesion on CT scan earlier same day. EXAM: MRI ABDOMEN WITHOUT AND WITH CONTRAST TECHNIQUE: Multiplanar multisequence MR imaging of the abdomen was performed both before and after the administration of intravenous contrast. CONTRAST:  7mL GADAVIST GADOBUTROL 1 MMOL/ML IV SOLN COMPARISON:  CT scan from  earlier same day. FINDINGS: Lower chest: Unremarkable. Hepatobiliary: No suspicious focal abnormality in the liver parenchyma. Mild intrahepatic biliary duct prominence. No evidence for gallstones. No common bile duct dilatation (5 mm diameter in the head of the pancreas. No evidence for choledocholithiasis Pancreas: 4.4 x 3.1 x 2.8 cm multilocular mass lesion is identified in the tail of pancreas. There is atrophy in the tip of the pancreas with associated main duct dilatation. After IV contrast administration there is enhancement in the periphery of the lesion with enhancing smooth internal septations. Spleen:  No splenomegaly. No focal mass lesion. Adrenals/Urinary Tract: No adrenal nodule or mass. Right kidney unremarkable. 1.7 cm simple cyst noted upper pole left kidney. No followup imaging is recommended. Stomach/Bowel: Stomach is unremarkable. No gastric wall thickening. No evidence of outlet obstruction. Duodenum is normally  positioned as is the ligament of Treitz. No small bowel or colonic dilatation within the visualized abdomen. Vascular/Lymphatic: No abdominal aortic aneurysm. No abdominal lymphadenopathy. 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 Other:  No intraperitoneal free fluid. Musculoskeletal: No focal suspicious marrow enhancement within the visualized bony anatomy. Artifact from lower lumbar fixation hardware evident. IMPRESSION: 1. 4.4 x 3.1 x 2.8 cm multilocular mass lesion in the tail of the pancreas with associated main duct dilatation and parenchymal atrophy in the tip of the pancreatic tail. Mucinous cystic neoplasm, IPMN, and pseudocyst could all have this appearance although mucinous cystic neoplasm favored based on imaging. Endoscopic ultrasound recommended to further evaluate. 2. 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 3. Mild intrahepatic biliary duct dilatation. No common bile duct dilatation. No evidence for choledocholithiasis. Electronically Signed   By: Kennith Center M.D.   On:  07/03/2022 08:47   MR 3D Recon At Scanner  Result Date: 07/03/2022 CLINICAL DATA:  Pancreatic tail lesion on CT scan earlier same day. EXAM: MRI ABDOMEN WITHOUT AND WITH CONTRAST TECHNIQUE: Multiplanar multisequence MR imaging of the abdomen was performed both before and after the administration of intravenous contrast. CONTRAST:  7mL GADAVIST GADOBUTROL 1 MMOL/ML IV SOLN COMPARISON:  CT scan from earlier same day. FINDINGS: Lower chest: Unremarkable. Hepatobiliary: No suspicious focal abnormality in the liver parenchyma. Mild intrahepatic biliary duct prominence. No evidence for gallstones. No common bile duct dilatation (5 mm diameter in the head of the pancreas. No evidence for choledocholithiasis Pancreas: 4.4 x 3.1 x 2.8 cm multilocular mass lesion is identified in the tail of pancreas. There is atrophy in the tip of the pancreas with associated main duct dilatation. After IV contrast administration there is enhancement in the periphery of the lesion with enhancing smooth internal septations. Spleen:  No splenomegaly. No focal mass lesion. Adrenals/Urinary Tract: No adrenal nodule or mass. Right kidney unremarkable. 1.7 cm simple cyst noted upper pole left kidney. No followup imaging is recommended. Stomach/Bowel: Stomach is unremarkable. No gastric wall thickening. No evidence of outlet obstruction. Duodenum is normally positioned as is the ligament of Treitz. No small bowel or colonic dilatation within the visualized abdomen. Vascular/Lymphatic: No abdominal aortic aneurysm. No abdominal lymphadenopathy. 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 Other:  No intraperitoneal free fluid. Musculoskeletal: No focal suspicious marrow enhancement within the visualized bony anatomy. Artifact from lower lumbar fixation hardware evident. IMPRESSION: 1. 4.4 x 3.1 x 2.8 cm multilocular mass lesion in the tail of the pancreas with associated main duct dilatation and parenchymal atrophy in the tip of the pancreatic tail.  Mucinous cystic neoplasm, IPMN, and pseudocyst could all have this appearance although mucinous cystic neoplasm favored based on imaging. Endoscopic ultrasound recommended to further evaluate. 2. 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 3. Mild intrahepatic biliary duct dilatation. No common bile duct dilatation. No evidence for choledocholithiasis. Electronically Signed   By: Kennith Center M.D.   On: 07/03/2022 08:47   CT ABDOMEN PELVIS W CONTRAST  Result Date: 07/02/2022 CLINICAL DATA:  Left-sided abdominal pain, suspected pancreatitis. EXAM: CT ABDOMEN AND PELVIS WITH CONTRAST TECHNIQUE: Multidetector CT imaging of the abdomen and pelvis was performed using the standard protocol following bolus administration of intravenous contrast. RADIATION DOSE REDUCTION: This exam was performed according to the departmental dose-optimization program which includes automated exposure control, adjustment of the mA and/or kV according to patient size and/or use of iterative reconstruction technique. CONTRAST:  OMNIPAQUE IOHEXOL 300 MG/ML  SOLN COMPARISON:  04/07/2022 FINDINGS: Lower chest: Unremarkable Hepatobiliary: Stable 6 mm cyst in  segment 2 of the liver. Borderline intrahepatic biliary dilatation. Gallbladder unremarkable. No extrahepatic biliary dilatation. Pancreas: In the pancreatic tail the site of prior mild hypoenhancement and inflammation, there is a larger region of hypoenhancement measuring 3.8 by 3.0 cm on image 18 series 2, and faintly reduced enhancement further distally in the pancreatic tail is well. Moreover, there is a bilobed cystic lesion within the pancreas measuring 2.6 by 1.6 by 2.0 cm. This cystic lesion does not have substantial peripheral enhancement and accordingly I favor a pseudocyst over abscess. In this setting, underlying pancreatic adenocarcinoma is difficult to totally exclude. Although a substantial portion of the pancreatic tail is hypoenhancing, we do not demonstrate lack of  enhancement to suggest pancreatic necrosis. Pancreatic body and head appear normal. Mild stranding in the adipose tissues around the inflamed portion of the pancreas. Spleen: Unremarkable Adrenals/Urinary Tract: No clinically significant findings. The median lobe of the prostate gland indents the bladder base. 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 Atherosclerosis is present, including aortoiliac atherosclerotic disease. No pathologic adenopathy. Reproductive: Mild prostatomegaly. Other: No supplemental non-categorized findings. Musculoskeletal: Posterolateral rod and pedicle screw fixation with interbody fusion at L5-S1. Posterior decompression at L5-S1. IMPRESSION: 1. Enlarging region of hypoenhancement in the pancreatic tail at the site of prior mild hypoenhancement and inflammation. New bilobed cystic lesion centrally within this hypoenhancing region measuring 2.6 by 1.6 by 2.0 cm (volume = 4.4 cm^3). Although the appearance favors a pseudocyst over abscess, the possibility of underlying pancreatic adenocarcinoma with secondary inflammation is difficult to totally exclude. Assuming typical clinical presentation and scenario for pancreatitis, follow up pancreatic protocol MRI with and without contrast is recommended in 2-3 months time to reassess the region and ensure expected improvement. If the clinical scenario is atypical for pancreatitis, then a more aggressive course for potential sampling of the pancreatic tail should be considered. 2. 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 3. Borderline intrahepatic biliary dilatation. 4. Mild prostatomegaly. 5. Prior fusion at L5-S1. 6. Aortic atherosclerosis. Aortic Atherosclerosis (ICD10-I70.0).  Electronically Signed   By: Gaylyn Rong M.D.   On: 07/02/2022 18:05    PERFORMANCE STATUS (ECOG) : 1 - Symptomatic but completely ambulatory  Review of Systems Unless otherwise noted, a complete review of systems is negative.  Physical Exam General: NAD Cardiovascular: regular rate and rhythm Pulmonary: clear ant fields Abdomen: soft, nontender, + bowel sounds GU: no suprapubic tenderness Extremities: no edema, no joint deformities Skin: no rashes Neurological: Weakness but otherwise nonfocal  IMPRESSION: Patient seen this morning by Dr. Orlie Dakin.  Patient recently found to have a pancreatic tail mass on MRI.  No distant metastatic disease was found on MRI.  He is pending PET scan with plan for probable referral to surgeon for consideration of resection.  Patient has had ongoing upper abdominal pain, likely secondary to his pancreatic mass +/- chronic pancreatitis.  He has been managed on opioids for many years due to chronic neck and back pain.  These have previously been prescribed by his PCP - Dr. Letitia Libra.  Patient is currently on a regimen of MS Contin 30 mg in a.m. and 15 milligrams at night.  In addition, patient takes oxycodone 10 mg every 4 hours around-the-clock.  He says that he was previously on MS Contin 30 mg every 8 hours many years ago  but at some point this was weaned.  Patient does not recall the specifics.  He denies any adverse effects from pain meds.  Discussed options for adjustment of his pain regimen.  It would be reasonable to increase his MS Contin to 30 mg every 12 hours and continue oxycodone as needed for breakthrough pain.  Additionally, will refer him to IR for consideration of celiac plexus block.  It appears that probable pancreatic cancer may be early stage.  If that is the case, plan would be for resection.    Will speak with PCP regarding opioid prescribing.  Unclear if he has an opioid contract.  As it is a Friday and his PCPs office closes early,  we will go ahead and send a prescription for refill of the MS Contin.  Discussed with patient and wife that long-term refills would need to be approved by PCP.   PLAN: -Continue plan for workup -Increase MS Contin 30 mg every 12 hours (message sent to Dr. Letitia Libra) -Continue oxycodone as needed for breakthrough pain -Daily bowel regimen -Referral to IR  Case and plan discussed with Dr. Orlie Dakin  Patient expressed understanding and was in agreement with this plan. He also understands that He can call the clinic at any time with any questions, concerns, or complaints.     Time Total: 25 minutes  Visit consisted of counseling and education dealing with the complex and emotionally intense issues of symptom management and palliative care in the setting of serious and potentially life-threatening illness.Greater than 50%  of this time was spent counseling and coordinating care related to the above assessment and plan.  Signed by: Laurette Schimke, PhD, NP-C

## 2022-07-16 NOTE — Progress Notes (Signed)
Pt is having severe abdominal pain he rates his pain at a 6 or 7. Had some CT Scans done on 4/26 and 07/03/2022 and would like to discuss the results.

## 2022-07-16 NOTE — Progress Notes (Signed)
Referral sent to Dr. Allen at [facility]. 

## 2022-07-17 LAB — CANCER ANTIGEN 19-9: CA 19-9: 136 U/mL — ABNORMAL HIGH (ref 0–35)

## 2022-07-19 ENCOUNTER — Other Ambulatory Visit: Payer: Self-pay | Admitting: *Deleted

## 2022-07-21 ENCOUNTER — Ambulatory Visit
Admission: RE | Admit: 2022-07-21 | Discharge: 2022-07-21 | Disposition: A | Discharge status: Home / Self Care | Payer: Medicare HMO | Source: Ambulatory Visit | Attending: Oncology | Admitting: Oncology

## 2022-07-21 DIAGNOSIS — J439 Emphysema, unspecified: Secondary | ICD-10-CM | POA: Insufficient documentation

## 2022-07-21 DIAGNOSIS — I700 Atherosclerosis of aorta: Secondary | ICD-10-CM | POA: Insufficient documentation

## 2022-07-21 DIAGNOSIS — I2510 Atherosclerotic heart disease of native coronary artery without angina pectoris: Secondary | ICD-10-CM | POA: Insufficient documentation

## 2022-07-21 DIAGNOSIS — C259 Malignant neoplasm of pancreas, unspecified: Secondary | ICD-10-CM | POA: Insufficient documentation

## 2022-07-21 DIAGNOSIS — K8689 Other specified diseases of pancreas: Secondary | ICD-10-CM | POA: Diagnosis not present

## 2022-07-21 DIAGNOSIS — R911 Solitary pulmonary nodule: Secondary | ICD-10-CM | POA: Diagnosis not present

## 2022-07-21 LAB — GLUCOSE, CAPILLARY: Glucose-Capillary: 136 mg/dL — ABNORMAL HIGH (ref 70–99)

## 2022-07-21 MED ORDER — FLUDEOXYGLUCOSE F - 18 (FDG) INJECTION
7.8000 | Freq: Once | INTRAVENOUS | Status: AC
  Administered 2022-07-21: 8.35 via INTRAVENOUS

## 2022-07-22 ENCOUNTER — Telehealth: Payer: Self-pay

## 2022-07-22 NOTE — Telephone Encounter (Signed)
Reached out to David Harrell to see if he has been able to schedule his appointment with [facility]. They have documented they left him a voicemail. He reports he has not heard from them. I have provided him the [facility] phone number to call to schedule. Will continue to follow.

## 2022-07-28 DIAGNOSIS — E7800 Pure hypercholesterolemia, unspecified: Secondary | ICD-10-CM | POA: Diagnosis not present

## 2022-07-28 DIAGNOSIS — Z125 Encounter for screening for malignant neoplasm of prostate: Secondary | ICD-10-CM | POA: Diagnosis not present

## 2022-07-28 DIAGNOSIS — I10 Essential (primary) hypertension: Secondary | ICD-10-CM | POA: Diagnosis not present

## 2022-07-28 DIAGNOSIS — G8929 Other chronic pain: Secondary | ICD-10-CM | POA: Diagnosis not present

## 2022-07-28 DIAGNOSIS — F17210 Nicotine dependence, cigarettes, uncomplicated: Secondary | ICD-10-CM | POA: Diagnosis not present

## 2022-07-28 DIAGNOSIS — Z0000 Encounter for general adult medical examination without abnormal findings: Secondary | ICD-10-CM | POA: Diagnosis not present

## 2022-07-28 DIAGNOSIS — Z1331 Encounter for screening for depression: Secondary | ICD-10-CM | POA: Diagnosis not present

## 2022-07-28 DIAGNOSIS — M545 Low back pain, unspecified: Secondary | ICD-10-CM | POA: Diagnosis not present

## 2022-08-03 ENCOUNTER — Other Ambulatory Visit: Payer: Self-pay

## 2022-08-03 ENCOUNTER — Ambulatory Visit: Payer: Self-pay | Admitting: Surgery

## 2022-08-03 ENCOUNTER — Other Ambulatory Visit: Payer: Self-pay | Admitting: Surgery

## 2022-08-03 DIAGNOSIS — R634 Abnormal weight loss: Secondary | ICD-10-CM

## 2022-08-03 DIAGNOSIS — C259 Malignant neoplasm of pancreas, unspecified: Secondary | ICD-10-CM

## 2022-08-03 DIAGNOSIS — C252 Malignant neoplasm of tail of pancreas: Secondary | ICD-10-CM | POA: Diagnosis not present

## 2022-08-03 DIAGNOSIS — Z720 Tobacco use: Secondary | ICD-10-CM | POA: Diagnosis not present

## 2022-08-03 DIAGNOSIS — F1190 Opioid use, unspecified, uncomplicated: Secondary | ICD-10-CM | POA: Diagnosis not present

## 2022-08-05 ENCOUNTER — Inpatient Hospital Stay (HOSPITAL_BASED_OUTPATIENT_CLINIC_OR_DEPARTMENT_OTHER): Payer: Medicare HMO | Admitting: Hospice and Palliative Medicine

## 2022-08-05 ENCOUNTER — Inpatient Hospital Stay: Payer: Medicare HMO

## 2022-08-05 DIAGNOSIS — G8929 Other chronic pain: Secondary | ICD-10-CM | POA: Diagnosis not present

## 2022-08-05 DIAGNOSIS — K861 Other chronic pancreatitis: Secondary | ICD-10-CM | POA: Diagnosis not present

## 2022-08-05 DIAGNOSIS — R1010 Upper abdominal pain, unspecified: Secondary | ICD-10-CM | POA: Diagnosis not present

## 2022-08-05 DIAGNOSIS — M549 Dorsalgia, unspecified: Secondary | ICD-10-CM | POA: Diagnosis not present

## 2022-08-05 DIAGNOSIS — G893 Neoplasm related pain (acute) (chronic): Secondary | ICD-10-CM | POA: Diagnosis not present

## 2022-08-05 DIAGNOSIS — C252 Malignant neoplasm of tail of pancreas: Secondary | ICD-10-CM | POA: Diagnosis not present

## 2022-08-05 DIAGNOSIS — C259 Malignant neoplasm of pancreas, unspecified: Secondary | ICD-10-CM

## 2022-08-05 DIAGNOSIS — Z515 Encounter for palliative care: Secondary | ICD-10-CM

## 2022-08-05 DIAGNOSIS — F1290 Cannabis use, unspecified, uncomplicated: Secondary | ICD-10-CM | POA: Diagnosis not present

## 2022-08-05 DIAGNOSIS — Z79899 Other long term (current) drug therapy: Secondary | ICD-10-CM | POA: Diagnosis not present

## 2022-08-05 DIAGNOSIS — F17210 Nicotine dependence, cigarettes, uncomplicated: Secondary | ICD-10-CM | POA: Diagnosis not present

## 2022-08-05 MED ORDER — MORPHINE SULFATE ER 30 MG PO TBCR: 30.0000 mg | EXTENDED_RELEASE_TABLET | Freq: Three times a day (TID) | ORAL | 0 refills | Status: DC

## 2022-08-05 NOTE — Progress Notes (Signed)
Palliative Medicine [facility] [phone] [phone]   Name: David Harrell Date: 08/05/2022 MRN: [id number]  DOB: 23-Feb-1961  Patient Care Team: Gracelyn Nurse, MD as PCP - General (Internal Medicine) Benita Gutter, RN as Oncology Nurse Navigator    REASON FOR CONSULTATION: David Harrell is a 62 y.o. male with multiple medical problems including history of pancreatitis with subsequent hospitalization for continued abdominal pain and found to have a pancreatic tail mass.  Patient has history of chronic back pain for which she is on MS Contin and oxycodone.  He was referred to palliative care to address goals and manage ongoing symptoms.  SOCIAL HISTORY:     reports that he has been smoking cigarettes. He has a 20.00 pack-year smoking history. He has never used smokeless tobacco. He reports that he does not currently use alcohol. He reports current drug use. Frequency: 1.00 time per week. Drug: Marijuana.  Patient is married and lives at home with his wife.  ADVANCE DIRECTIVES:  Does not have  CODE STATUS:   PAST MEDICAL HISTORY: Past Medical History:  Diagnosis Date   Arthritis    Hiatal hernia    Hypertension     PAST SURGICAL HISTORY:  Past Surgical History:  Procedure Laterality Date   ESOPHAGOGASTRODUODENOSCOPY N/A 07/12/2022   Procedure: ESOPHAGOGASTRODUODENOSCOPY (EGD);  Surgeon: Lemar Lofty., MD;  Location: [facility];  Service: Gastroenterology;  Laterality: N/A;   ESOPHAGOGASTRODUODENOSCOPY (EGD) WITH PROPOFOL N/A 07/05/2022   Procedure: ESOPHAGOGASTRODUODENOSCOPY (EGD) WITH PROPOFOL;  Surgeon: [name], DO;  Location: [facility];  Service: Gastroenterology;  Laterality: N/A;   EUS N/A 07/12/2022   Procedure: UPPER ENDOSCOPIC ULTRASOUND (EUS) RADIAL;  Surgeon: Lemar Lofty., MD;  Location: [facility] END[facility]COPY;  Service: Gastroenterology;  Laterality: N/A;   FINE NEEDLE  ASPIRATION  07/12/2022   Procedure: FINE NEEDLE ASPIRATION;  Surgeon: Meridee Score, Netty Starring., MD;  Location: [facility];  Service: Gastroenterology;;   JOINT REPLACEMENT      HEMATOLOGY/ONCOLOGY HISTORY:  Oncology History  Pancreatic adenocarcinoma (HCC)  07/16/2022 Initial Diagnosis   Pancreatic adenocarcinoma (HCC)   07/16/2022 Cancer Staging   Staging form: Exocrine Pancreas, AJCC 8th Edition - Clinical stage from 07/16/2022: Stage IIA (cT3, cN0, cM0) - Signed by Jeralyn Ruths, MD on 07/16/2022 Stage prefix: Initial diagnosis Total positive nodes: 0     ALLERGIES:  is allergic to aspirin, pregabalin, tape, codeine, oxymorphone, penicillins, and vicodin [hydrocodone-acetaminophen].  MEDICATIONS:  Current Outpatient Medications  Medication Sig Dispense Refill   ALPRAZolam (XANAX) 1 MG tablet Take 1 mg by mouth 2 (two) times daily.     apixaban (ELIQUIS) 5 MG TABS tablet Take 2 tablets (10 mg total) by mouth 2 (two) times daily for 7 days, THEN 1 tablet (5 mg total) 2 (two) times daily for 23 days. HOLD THIS MEDICATION FOR 2 DAYS PRIOR TO YOUR BIOPSY PROCEDURE. 74 tablet 0   budesonide-formoterol (SYMBICORT) 160-4.5 MCG/ACT inhaler Inhale 2 puffs into the lungs 2 (two) times daily as needed (shortness of breath).     lisinopril (ZESTRIL) 20 MG tablet Take 20 mg by mouth daily.     meloxicam (MOBIC) 15 MG tablet Take 15 mg by mouth daily.     morphine (MS CONTIN) 30 MG 12 hr tablet Take 1 tablet (30 mg total) by mouth every 8 (eight) hours. 90 tablet 0   ondansetron (ZOFRAN) 4 MG tablet Take 1 tablet (4 mg total) by mouth every 6 (  six) hours as needed for nausea. 20 tablet 0   Oxycodone HCl 10 MG TABS Take 1 tablet by mouth every 4 (four) hours as needed.     pantoprazole (PROTONIX) 40 MG tablet Take 1 tablet (40 mg total) by mouth 2 (two) times daily. 60 tablet 0   rosuvastatin (CRESTOR) 20 MG tablet Take 20 mg by mouth daily.     No current facility-administered medications for  this visit.    VITAL SIGNS: There were no vitals taken for this visit. There were no vitals filed for this visit.  Estimated body mass index is 21.74 kg/m as calculated from the following:   Height as of 07/12/22: 5\' 8"  (1.727 m).   Weight as of an earlier encounter on 08/05/22: 143 lb (64.9 kg).  LABS: CBC:    Component Value Date/Time   WBC 7.6 07/16/2022 1101   HGB 13.7 07/16/2022 1101   HGB 15.3 06/30/2014 1750   HCT 41.9 07/16/2022 1101   HCT 44.3 06/30/2014 1750   PLT 212 07/16/2022 1101   PLT 226 06/30/2014 1750   MCV 89.3 07/16/2022 1101   MCV 88 06/30/2014 1750   NEUTROABS 4.5 07/16/2022 1101   LYMPHSABS 2.3 07/16/2022 1101   MONOABS 0.5 07/16/2022 1101   EOSABS 0.2 07/16/2022 1101   BASOSABS 0.1 07/16/2022 1101   Comprehensive Metabolic Panel:    Component Value Date/Time   NA 138 07/16/2022 1101   NA 139 06/30/2014 1750   K 3.7 07/16/2022 1101   K 3.3 (L) 06/30/2014 1750   CL 101 07/16/2022 1101   CL 105 06/30/2014 1750   CO2 26 07/16/2022 1101   CO2 20 (L) 06/30/2014 1750   BUN 11 07/16/2022 1101   BUN 10 06/30/2014 1750   CREATININE 0.78 07/16/2022 1101   CREATININE 0.85 06/30/2014 1750   GLUCOSE 117 (H) 07/16/2022 1101   GLUCOSE 115 (H) 06/30/2014 1750   CALCIUM 8.8 (L) 07/16/2022 1101   CALCIUM 8.9 06/30/2014 1750   AST 17 07/16/2022 1101   ALT 15 07/16/2022 1101   ALKPHOS 57 07/16/2022 1101   BILITOT 0.5 07/16/2022 1101   PROT 7.9 07/16/2022 1101   ALBUMIN 4.4 07/16/2022 1101    RADIOGRAPHIC STUDIES: NM PET Image Initial (PI) Skull Base To Thigh  Result Date: 07/24/2022 CLINICAL DATA:  Initial treatment strategy for pancreatic tail lesion on CT and MRI. EXAM: NUCLEAR MEDICINE PET SKULL BASE TO THIGH TECHNIQUE: 8.4 mCi F-18 FDG was injected intravenously. Full-ring PET imaging was performed from the skull base to thigh after the radiotracer. CT data was obtained and used for attenuation correction and anatomic localization. Fasting blood  glucose: 136 mg/dl COMPARISON:  MRI 86/57/8469.  CT 07/02/2022. FINDINGS: Mediastinal blood pool activity: SUV max 2.7 Liver activity: SUV max NA NECK: No areas of abnormal hypermetabolism. Incidental CT findings:   No cervical adenopathy. CHEST: No pulmonary parenchymal or thoracic nodal hypermetabolism. Incidental CT findings: Aortic and coronary artery calcification. No thoracic adenopathy. Centrilobular emphysema. Right apical pulmonary nodule of 3 mm on 50/4. 2 mm left upper lobe pulmonary nodule on 56/4. Well below PET resolution. ABDOMEN/PELVIS: No abdominopelvic nodal hypermetabolism. Corresponding to the pancreatic body/tail junction lesion on prior CT and MRI is hypermetabolism. Example 2.0 x 2.5 cm and a S.U.V. max of 7.9 on 100/4. Presumed urinary contamination about the left side of the glands penis. Incidental CT findings: Abdominal findings deferred to recent CT and MRI. Normal adrenal glands. Scattered colonic diverticula. No abdominopelvic adenopathy. 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 SKELETON: Seventh posterolateral right rib hypermetabolism at a S.U.V. max of 3.7 included on image 74. No CT correlate. Incidental CT findings: Lumbosacral spine fixation. IMPRESSION: 1. Pancreatic body/tail junction hypermetabolic lesion, favoring adenocarcinoma. 2. No typical findings of  metastasis. 3. Isolated posterolateral right seventh rib hypermetabolism without CT correlate. Most likely related to remote trauma. An atypical distribution for isolated metastasis from pancreatic adenocarcinoma. Recommend attention on follow-up. 4. Bilateral upper lobe pulmonary nodules of maximally 3 mm are favored to be benign/incidental but are below PET resolution. These could be re-evaluated by chest CT at 6 months. 5. Presumed urinary contamination about the left side of the glands penis. Consider physical exam correlation. 6. Incidental findings, including: Aortic  atherosclerosis (ICD10-I70.0), coronary artery atherosclerosis and emphysema (ICD10-J43.9). Electronically Signed   By: Jeronimo Greaves M.D.   On: 07/24/2022 16:06    PERFORMANCE STATUS (ECOG) : 1 - Symptomatic but completely ambulatory  Review of Systems Unless otherwise noted, a complete review of systems is negative.  Physical Exam General: NAD Cardiovascular: regular rate and rhythm Pulmonary: clear ant fields Abdomen: soft, nontender, + bowel sounds GU: no suprapubic tenderness Extremities: no edema, no joint deformities Skin: no rashes Neurological: Weakness but otherwise nonfocal  IMPRESSION: Patient was an add-on to my schedule today to address pain.  Patient accompanied by his wife.  Patient reports ongoing severe and poorly controlled upper abdominal pain secondary to known pancreatic adenocarcinoma.  Patient denies fever or chills.  No nausea or vomiting.  No abdominal distention.  Patient saw surgeon - Dr. Freida Busman, earlier this week with plan for possible resection as soon as next week.  Patient reports that he is taking oxycodone every 4 hours around-the-clock.  He did not find much improvement after increasing MS Contin to 30 mg every 12 hours.  Will proceed with further increase the MS Contin to every 8 hours.    Patient likely will require adjuvant chemotherapy.  Discussed with Dr. Orlie Dakin.  PLAN: -Continue plan for workup -Increase MS Contin 30 mg every 8 hours  -Continue oxycodone as needed for breakthrough pain -Daily bowel regimen -Follow-up telephone visit 3 weeks  Case and plan discussed with Dr. Orlie Dakin  Patient expressed understanding and was in agreement with this plan. He also understands that He can call the clinic at any time with any questions, concerns, or complaints.     Time Total: 15 minutes  Visit consisted of counseling and education dealing with the complex and emotionally intense issues of symptom management and palliative care in the setting  of serious and potentially life-threatening illness.Greater than 50%  of this time was spent counseling and coordinating care related to the above assessment and plan.  Signed by: Laurette Schimke, PhD, NP-C

## 2022-08-05 NOTE — Progress Notes (Addendum)
Nutrition Assessment   Reason for Assessment:   Weight loss   ASSESSMENT:  62 year old male with newly diagnosed pancreatic adenocarcinoma.  Noted hospital admission in Jan for acute pancreatitis.  Recent admission with pancreatic cancer.  Patient has seen Dr Freida Busman for surgical resection. Past medical history of HTN, hiatial hernia, pancreatitis.  Noted per chart planning pancreatectomy and splenectomy.    Met with patient and wife in clinic.  Patient walking with cane.  Reports that appetite has been decreased.  Says that most everything he eats causes abdominal pain.  Even when he is not eating he has pain.  Yesterday only able to eat beef broth and drinking water soda every now and then.  Has not been drinking protein shakes as can't afford them.  Reports nausea at times but currently out of zofran that was prescribed in the hospital.  Reports normal bowel movement typically daily.     Nutrition Focused Physical Exam:   Orbital Region: moderate Buccal Region: moderate Upper Arm Region: mild Thoracic and Lumbar Region: normal Temple Region: mild Clavicle Bone Region: normal Shoulder and Acromion Bone Region: mild Scapular Bone Region: normal Dorsal Hand: normal Patellar Region: not observed, wearing jeans Anterior Thigh Region: not observed Posterior Calf Region: normal Edema (RD assessment): none   Medications: protonix   Labs: reviewed   Anthropometrics:   Height: 68 inches Weight: 143 lb today UBW: 170-175 lb in Jan/Feb 2024 per patient BMI: 21  16% weight loss in the last 4 months, significant   Estimated Energy Needs  Kcals: 1625-1950 Protein: 81-98 g Fluid: 1625-1950 ml   NUTRITION DIAGNOSIS: Inadequate oral intake related to cancer, abdominal pain as evidenced by 16% weight loss in the last 4 months, eating less than 75% of energy needs for > or equal to 1 month   MALNUTRITION DIAGNOSIS: Patient meets criteria for severe malnutrition in context of  chronic illness as evidenced by 16% weight loss in the last 4 months and eating less than 75% of estimated energy needs for > or equal to 1 month   INTERVENTION:  Encouraged small frequent meals/snacks q 2 hours Complimentary case of ensure plus HP given to patient today. Encouraged 2-3 a day if possible Discussed foods lower in fat as less likely to cause increase in abdominal pain.  List of lower fat food choices given to patient today.   Encouraged good sources of protein and we discussed foods that contain protein today. Spoke with Palliative Care and able to see patient today regarding pain medication and zofran refill.   Contact information provided   MONITORING, EVALUATION, GOAL: weight trends, intake   Next Visit: as needed Patient has RD contact information  Othal Kubitz B. Freida Busman, RD, LDN Registered Dietitian [phone]

## 2022-08-10 NOTE — Progress Notes (Signed)
     Your surgery and Pre-Admission testing visit will be at [facility] located at [address], [place], [place] [address].  Please let all your doctors (i.e., Primary Care Physician, Cardiologist, Endocrinologist, Pulmonologist) know you are having surgery. You may need clearance for surgery. If you are on blood thinners, notify your surgeon and ask the doctor who prescribed them how long to hold them before surgery.  If you have had a heart test, such as an EKG, stress test, heart ultrasound, etc., or lab work performed outside of [place], please bring copies of these tests to your Pre-Admission testing, if possible.  These departments may contact you before the day of surgery:  Pre-Service Center - insurance/ billing: [phone] Pharmacy- to review your medications: [phone] Pre-Admission Testing- to set an appointment for your visit: [phone]  (Often, these numbers show up as "[phone]" on your phone)  The Pre-Admission Testing (PAT) visit focuses on Anesthesia for your upcoming surgery.  You do NOT need to fast; take your medications as usual. Please arrive 30 minutes early to allow for parking and admitting.  The visit may last up to an hour. Bring a photo ID and medical insurance card. Reschedule if you are sick. ([phone]) and please, NO children under age 16 at the visit.  During the PAT visit:  We will review your medical and surgical history.   You will receive pre-operative instructions, including the time of arrival at the hospital and surgical start time.  We will review what medication(s) you can take on the day of surgery.  After speaking with the nurse, you will have blood drawn and, if needed, a chest x-ray and EKG.  Most lab results from your doctor are good for 30 days, Hemoglobin A1C is good for 60 days. If you cannot talk to the Pharmacy, bring your medications or a list of them to the PST visit.   Infection control for the [facility] requires: All fingernail and toenail products should be removed before the day of surgery.  (SNS, Acrylic, Gel, Polish, Stickers, Press on, and Poly gel nails.)   Parking information:  Address: [facility] - [address], [place], [place] [address]  Please look for signs for entrance A off of [address]. Free valet parking is available Monday-Friday 05:30am-06:00pm     

## 2022-08-11 NOTE — Pre-Procedure Instructions (Signed)
Surgical Instructions    Your procedure is scheduled on Wednesday, June 12th.  Report to [facility] Main Entrance "A" at 09:00 A.M., then check in with the Admitting office.  Call this number if you have problems the morning of surgery:  [phone]  If you have any questions prior to your surgery date call [phone]: Open Monday-Friday 8am-4pm If you experience any cold or flu symptoms such as cough, fever, chills, shortness of breath, etc. between now and your scheduled surgery, please notify us at the above number.     Remember:  Do not eat after midnight the night before your surgery  You may drink clear liquids until 08:00 AM the morning of your surgery.   Clear liquids allowed are: Water, Non-Citrus Juices (without pulp), Carbonated Beverages, Clear Tea, Black Coffee Only (NO MILK, CREAM OR POWDERED CREAMER of any kind), and Gatorade.   Patient Instructions  The night before surgery:  No food after midnight. ONLY clear liquids after midnight. Drink 2 Ensures the night before surgery.  The day of surgery (if you do NOT have diabetes):  Drink ONE (1) Pre-Surgery Clear Ensure by 08:00 AM the morning of surgery. Drink in one sitting. Do not sip.  This drink was given to you during your hospital  pre-op appointment visit.  Nothing else to drink after completing the  Pre-Surgery Clear Ensure.          If you have questions, please contact your surgeon's office.     Take these medicines the morning of surgery with A SIP OF WATER  ALPRAZolam (XANAX)  morphine (MS CONTIN)  omeprazole (PRILOSEC OTC)  pantoprazole (PROTONIX)  rosuvastatin (CRESTOR)    If needed: budesonide-formoterol (SYMBICORT)  ondansetron (ZOFRAN)  Oxycodone HCl     As of today, STOP taking any Aspirin (unless otherwise instructed by your surgeon) Aleve, Naproxen, Ibuprofen, Motrin, Advil, Goody's, BC's, meloxicam (MOBIC), all herbal medications, fish oil, and all vitamins.                      Do NOT Smoke (Tobacco/Vaping) for 24 hours prior to your procedure.  If you use a CPAP at night, you may bring your mask/headgear for your overnight stay.   Contacts, glasses, piercing's, hearing aid's, dentures or partials may not be worn into surgery, please bring cases for these belongings.    For patients admitted to the hospital, discharge time will be determined by your treatment team.   Patients discharged the day of surgery will not be allowed to drive home, and someone needs to stay with them for 24 hours.  SURGICAL WAITING ROOM VISITATION Patients having surgery or a procedure may have no more than 2 support people in the waiting area - these visitors may rotate.   Children under the age of 52 must have an adult with them who is not the patient. If the patient needs to stay at the hospital during part of their recovery, the visitor guidelines for inpatient rooms apply. Pre-op nurse will coordinate an appropriate time for 1 support person to accompany patient in pre-op.  This support person may not rotate.   Please refer to the [facility] website for the visitor guidelines for Inpatients (after your surgery is over and you are in a regular room).    Special instructions:   [facility]- Preparing For Surgery  Before surgery, you can play an important role. Because skin is not sterile, your skin needs to be as free of germs as possible.  You can reduce the number of germs on your skin by washing with CHG (chlorahexidine gluconate) Soap before surgery.  CHG is an antiseptic cleaner which kills germs and bonds with the skin to continue killing germs even after washing.    Oral Hygiene is also important to reduce your risk of infection.  Remember - BRUSH YOUR TEETH THE MORNING OF SURGERY WITH YOUR REGULAR TOOTHPASTE  Please do not use if you have an allergy to CHG or antibacterial soaps. If your skin becomes reddened/irritated stop using the CHG.  Do not shave (including legs and  underarms) for at least 48 hours prior to first CHG shower. It is OK to shave your face.  Please follow these instructions carefully.   Shower the NIGHT BEFORE SURGERY and the MORNING OF SURGERY  If you chose to wash your hair, wash your hair first as usual with your normal shampoo.  After you shampoo, rinse your hair and body thoroughly to remove the shampoo.  Use CHG Soap as you would any other liquid soap. You can apply CHG directly to the skin and wash gently with a scrungie or a clean washcloth.   Apply the CHG Soap to your body ONLY FROM THE NECK DOWN.  Do not use on open wounds or open sores. Avoid contact with your eyes, ears, mouth and genitals (private parts). Wash Face and genitals (private parts)  with your normal soap.   Wash thoroughly, paying special attention to the area where your surgery will be performed.  Thoroughly rinse your body with warm water from the neck down.  DO NOT shower/wash with your normal soap after using and rinsing off the CHG Soap.  Pat yourself dry with a CLEAN TOWEL.  Wear CLEAN PAJAMAS to bed the night before surgery  Place CLEAN SHEETS on your bed the night before your surgery  DO NOT SLEEP WITH PETS.   Day of Surgery: Take a shower with CHG soap. Do not wear jewelry  Do not wear lotions, powders, colognes, or deodorant.  Men may shave face and neck. Do not bring valuables to the hospital. [facility] is not responsible for any belongings or valuables. Wear Clean/Comfortable clothing the morning of surgery Remember to brush your teeth WITH YOUR REGULAR TOOTHPASTE.   Please read over the following fact sheets that you were given.    If you received a COVID test during your pre-op visit  it is requested that you wear a mask when out in public, stay away from anyone that may not be feeling well and notify your surgeon if you develop symptoms. If you have been in contact with anyone that has tested positive in the last 10 days please  notify you surgeon.

## 2022-08-12 ENCOUNTER — Encounter (HOSPITAL_COMMUNITY)
Admission: RE | Admit: 2022-08-12 | Discharge: 2022-08-12 | Disposition: A | Discharge status: Home / Self Care | Payer: Medicare HMO | Source: Ambulatory Visit | Attending: Surgery | Admitting: Surgery

## 2022-08-12 ENCOUNTER — Other Ambulatory Visit: Payer: Self-pay

## 2022-08-12 ENCOUNTER — Encounter (HOSPITAL_COMMUNITY): Payer: Self-pay

## 2022-08-12 VITALS — BP 117/88 | HR 81 | Resp 18 | Ht 68.0 in | Wt 145.5 lb

## 2022-08-12 DIAGNOSIS — Z01818 Encounter for other preprocedural examination: Secondary | ICD-10-CM

## 2022-08-12 DIAGNOSIS — C259 Malignant neoplasm of pancreas, unspecified: Secondary | ICD-10-CM | POA: Insufficient documentation

## 2022-08-12 DIAGNOSIS — I10 Essential (primary) hypertension: Secondary | ICD-10-CM | POA: Diagnosis not present

## 2022-08-12 DIAGNOSIS — F17200 Nicotine dependence, unspecified, uncomplicated: Secondary | ICD-10-CM | POA: Insufficient documentation

## 2022-08-12 DIAGNOSIS — Z01812 Encounter for preprocedural laboratory examination: Secondary | ICD-10-CM | POA: Insufficient documentation

## 2022-08-12 DIAGNOSIS — G8929 Other chronic pain: Secondary | ICD-10-CM | POA: Diagnosis not present

## 2022-08-12 HISTORY — DX: Personal history of urinary calculi: Z87.442

## 2022-08-12 HISTORY — DX: Gastro-esophageal reflux disease without esophagitis: K21.9

## 2022-08-12 LAB — CBC
HCT: 41.2 % (ref 39.0–52.0)
Hemoglobin: 13.8 g/dL (ref 13.0–17.0)
MCH: 29.4 pg (ref 26.0–34.0)
MCHC: 33.5 g/dL (ref 30.0–36.0)
MCV: 87.8 fL (ref 80.0–100.0)
Platelets: 221 10*3/uL (ref 150–400)
RBC: 4.69 MIL/uL (ref 4.22–5.81)
RDW: 13.1 % (ref 11.5–15.5)
WBC: 7.9 10*3/uL (ref 4.0–10.5)
nRBC: 0 % (ref 0.0–0.2)

## 2022-08-12 LAB — BASIC METABOLIC PANEL
Anion gap: 9 (ref 5–15)
BUN: 15 mg/dL (ref 6–20)
CO2: 25 mmol/L (ref 22–32)
Calcium: 9.1 mg/dL (ref 8.9–10.3)
Chloride: 103 mmol/L (ref 98–111)
Creatinine, Ser: 0.75 mg/dL (ref 0.61–1.24)
GFR, Estimated: >60 mL/min (ref >60)
Glucose, Bld: 126 mg/dL — ABNORMAL HIGH (ref 70–99)
Potassium: 4.6 mmol/L (ref 3.5–5.1)
Sodium: 137 mmol/L (ref 135–145)

## 2022-08-12 LAB — TYPE AND SCREEN

## 2022-08-12 NOTE — Progress Notes (Signed)
PCP - Dr. Marcelino Duster Cardiologist - denies  PPM/ICD - denies   Chest x-ray - 5 years ago per pt, with [facility], normal per pt EKG - 04/07/22 Stress Test - 5 years ago per pt, good per pt  ECHO -  5 years ago per pt, good per pt  Cardiac Cath - denies  Sleep Study - denies   DM- denies  Blood Thinner Instructions: Pt has not taken Eliquis since 5/30. Aspirin Instructions: n/a  ERAS Protcol - yes PRE-SURGERY Ensure (3) given at PAT   COVID TEST- n/a   Anesthesia review: yes, records requested  Patient denies shortness of breath, fever, cough and chest pain at PAT appointment   All instructions explained to the patient, with a verbal understanding of the material. Patient agrees to go over the instructions while at home for a better understanding.  The opportunity to ask questions was provided.

## 2022-08-13 ENCOUNTER — Other Ambulatory Visit: Payer: Self-pay | Admitting: *Deleted

## 2022-08-13 MED ORDER — MORPHINE SULFATE ER 30 MG PO TBCR: 30.0000 mg | EXTENDED_RELEASE_TABLET | Freq: Three times a day (TID) | ORAL | 0 refills | Status: DC

## 2022-08-13 NOTE — Progress Notes (Signed)
Anesthesia Chart Review:  Case: [id number] Date/Time: 08/18/22 1045   Procedures:      OPEN DISTAL PANCREATECTOMY WITH SPLENECTOMY     STAGING LAPAROSCOPY, INTRAOPERATIVE ULTRASOUND   Anesthesia type: General   Pre-op diagnosis: PANCREATIC CANCER   Location: [facility] ROOM 02 / [facility]   Surgeons: Fritzi Mandes, MD       DISCUSSION: Patient is a 62 year old male scheduled for the above procedure. 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  History includes smoking, HTN, pancreatic cancer, GERD, spinal surgery (C3-6 ACDF 12/20/07; L5-S1 PLIF), chronic pain (on chronic opioids). + Marijuana use. No current alcohol use.  [place] admission 04/07/22-04/12/22 for acute pancreatitis.  [place] admission 07/02/22-07/07/22 for abdominal pain, acute or chronic pancreatitis. 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 EGD 07/05/22 showed normal duodenal bulb, stomach, esophagus, irregular Z-line, biopsied. He was referred for EGD/EUS which was done on 07/12/22 by Dr. [name]. 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  OK to discontinue Eliquis per 08/03/22 note by Dr. Freida Busman.  Denies shortness of breath, cough, fever, chest pain at PAT RN visit.  Anesthesia team to evaluate on the day of surgery.    VS: BP 117/88   Pulse 81   Resp 18   Ht 5\' 8"  (1.727 m)   Wt 66 kg   SpO2 98%   BMI 22.12 kg/m    PROVIDERS: Gracelyn Nurse, MD is PCP Gerarda Fraction, MD is [facility] [place], Ivin Booty, NP is Palliative Care provider Elfredia Nevins, DO is GI (pending evaluation at [facility] 09/28/22)   LABS: Labs reviewed: Acceptable for surgery. LFTs normal 07/16/22. A1c 6.2% 07/02/22. (all labs ordered are listed, but only abnormal results are displayed)  Labs Reviewed  BASIC METABOLIC PANEL - Abnormal; Notable for the following components:      Result Value   Glucose, Bld 126 (*)    All other  components within normal limits  CBC  TYPE AND SCREEN     IMAGES: PET Scan 07/21/22: IMPRESSION: 1. Pancreatic body/tail junction hypermetabolic lesion, favoring adenocarcinoma. 2. No typical findings of  metastasis. 3. Isolated posterolateral right seventh rib hypermetabolism without CT correlate. Most likely related to remote trauma. An atypical distribution for isolated metastasis from pancreatic adenocarcinoma. Recommend attention on follow-up. 4. Bilateral upper lobe pulmonary nodules of maximally 3 mm are favored to be benign/incidental but are below PET resolution. These could be re-evaluated by chest CT at 6 months. 5. Presumed urinary contamination about the left side of the glands penis. Consider physical exam correlation. 6. Incidental findings, including: Aortic atherosclerosis (ICD10-I70.0), coronary artery atherosclerosis and emphysema (ICD10-J43.9).   MRI Abd 07/02/22: IMPRESSION: 1. 4.4 x 3.1 x 2.8 cm multilocular mass lesion in the tail of the pancreas with associated main duct dilatation and parenchymal atrophy in the tip of the pancreatic tail. Mucinous cystic neoplasm, IPMN, and pseudocyst could all have this appearance although mucinous cystic neoplasm favored based on imaging. Endoscopic ultrasound recommended to further evaluate. 2. 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 3. Mild intrahepatic biliary duct dilatation. No common bile duct dilatation. No evidence for choledocholithiasis.   CT Abd/pelvis 07/02/22: IMPRESSION: 1. Enlarging region of hypoenhancement in the pancreatic tail at the site of prior mild hypoenhancement and inflammation. New bilobed cystic lesion centrally within this hypoenhancing region measuring 2.6 by 1.6 by 2.0 cm (volume = 4.4 cm^3). Although the appearance favors a pseudocyst over abscess, the  possibility of underlying pancreatic adenocarcinoma with secondary inflammation is difficult to totally exclude. Assuming typical  clinical presentation and scenario for pancreatitis, follow up pancreatic protocol MRI with and without contrast is recommended in 2-3 months time to reassess the region and ensure expected improvement. If the clinical scenario is atypical for pancreatitis, then a more aggressive course for potential sampling of the pancreatic tail should be considered. 2. 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 3. Borderline intrahepatic biliary dilatation. 4. Mild prostatomegaly. 5. Prior fusion at L5-S1. 6. Aortic atherosclerosis.   EKG: EKG 04/07/22: Normal sinus rhythm Cannot rule out Anterior infarct , age undetermined Abnormal ECG When compared with ECG of 13-Apr-2018 17:08, Vent. rate has decreased BY 33 BPM Confirmed by UNCONFIRMED, DOCTOR ([id number]), editor Lonell Face ([id number]) on 04/08/2022 8:11:30 AM   CV: He reported unremarkable stress test and echo ~ 5 year ago at [facility].   Past Medical History:  Diagnosis Date   Arthritis    GERD (gastroesophageal reflux disease)    Hiatal hernia    History of kidney stones    Hypertension    Pancreatic cancer (HCC) 2024    Past Surgical History:  Procedure Laterality Date   ANTERIOR CERVICAL DECOMP/DISCECTOMY FUSION     BACK SURGERY     x5   ESOPHAGOGASTRODUODENOSCOPY N/A 07/12/2022   Procedure: ESOPHAGOGASTRODUODENOSCOPY (EGD);  Surgeon: Lemar Lofty., MD;  Location: [facility];  Service: Gastroenterology;  Laterality: N/A;   ESOPHAGOGASTRODUODENOSCOPY (EGD) WITH PROPOFOL N/A 07/05/2022   Procedure: ESOPHAGOGASTRODUODENOSCOPY (EGD) WITH PROPOFOL;  Surgeon: [name], DO;  Location: [facility] ENDOSCOPY;  Service: Gastroenterology;  Laterality: N/A;   EUS N/A 07/12/2022   Procedure: UPPER ENDOSCOPIC ULTRASOUND (EUS) RADIAL;  Surgeon: Lemar Lofty., MD;  Location: [facility] ENDOSCOPY;  Service:  Gastroenterology;  Laterality: N/A;   FINE NEEDLE ASPIRATION  07/12/2022   Procedure: FINE NEEDLE ASPIRATION;  Surgeon: Lemar Lofty., MD;  Location: [facility] ENDOSCOPY;  Service: Gastroenterology;;   SHOULDER ARTHROSCOPY Bilateral    possibly metal in right shoulder, pt unsure    MEDICATIONS:  ALPRAZolam (XANAX) 1 MG tablet   apixaban (ELIQUIS) 5 MG TABS tablet   budesonide-formoterol (SYMBICORT) 160-4.5 MCG/ACT inhaler   lisinopril (ZESTRIL) 20 MG tablet   meloxicam (MOBIC) 15 MG tablet   morphine (MS CONTIN) 30 MG 12 hr tablet   omeprazole (PRILOSEC OTC) 20 MG tablet   ondansetron (ZOFRAN) 4 MG tablet   Oxycodone HCl 10 MG TABS   pantoprazole (PROTONIX) 40 MG tablet   rosuvastatin (CRESTOR) 20 MG tablet   No current facility-administered medications for this encounter.   He is no longer on Eliquis.    Shonna Chock, PA-C Surgical Short Stay/Anesthesiology [facility] Phone [phone] [facility] Phone [phone] 08/13/2022 5:18 PM

## 2022-08-13 NOTE — Anesthesia Preprocedure Evaluation (Signed)
Anesthesia Evaluation  Patient identified by MRN, date of birth, ID band Patient awake    Reviewed: Allergy & Precautions, NPO status , Patient's Chart, lab work & pertinent test results  Airway Mallampati: III  TM Distance: >3 FB Neck ROM: Limited    Dental  (+) Loose, Missing, Dental Advisory Given,    Pulmonary Current Smoker and Patient abstained from smoking.   Pulmonary exam normal breath sounds clear to auscultation       Cardiovascular hypertension, Pt. on medications Normal cardiovascular exam Rhythm:Regular Rate:Normal     Neuro/Psych negative neurological ROS  negative psych ROS   GI/Hepatic hiatal hernia,,,(+)     substance abuse (MS Contin, 30mg  QAM, 15mg  QPM; oxycodone 10mg  q4hrs)    Endo/Other  negative endocrine ROS    Renal/GU negative Renal ROS  negative genitourinary   Musculoskeletal  (+) Arthritis , Osteoarthritis,  narcotic dependent  Abdominal   Peds  Hematology  (+) Blood dyscrasia (Eliquis, last dose Friday 5/3)   Anesthesia Other Findings Pancreatic lesion  Reproductive/Obstetrics                             Anesthesia Physical Anesthesia Plan  ASA: 3  Anesthesia Plan: General   Post-op Pain Management: Dilaudid IV and Lidocaine infusion*   Induction: Intravenous  PONV Risk Score and Plan: 2 and Treatment may vary due to age or medical condition, Ondansetron and Dexamethasone  Airway Management Planned: Oral ETT  Additional Equipment: Arterial line  Intra-op Plan:   Post-operative Plan: Extubation in OR  Informed Consent: I have reviewed the patients History and Physical, chart, labs and discussed the procedure including the risks, benefits and alternatives for the proposed anesthesia with the patient or authorized representative who has indicated his/her understanding and acceptance.     Dental advisory given  Plan Discussed with: CRNA and  Anesthesiologist  Anesthesia Plan Comments: (PAT note written 08/13/2022 by Shonna Chock, PA-C.  DISCUSSION: Patient is a 62 year old male scheduled for the above procedure. 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   History includes smoking, HTN, pancreatic cancer, GERD, spinal surgery (C3-6 ACDF 12/20/07; L5-S1 PLIF), chronic pain (on chronic opioids). + Marijuana use. No current alcohol use.   [place] admission 04/07/22-04/12/22 for acute pancreatitis.  [place] admission 07/02/22-07/07/22 for abdominal pain, acute or chronic pancreatitis. 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 EGD 07/05/22 showed normal duodenal bulb, stomach, esophagus, irregular Z-line, biopsied. He was referred for EGD/EUS which was done on 07/12/22 by Dr. [name]. 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   OK to discontinue Eliquis per 08/03/22 note by Dr. Freida Busman.  Denies shortness of breath, cough, fever, chest pain at PAT RN visit.   Anesthesia team to evaluate on the day of surgery.  )       Anesthesia Quick Evaluation

## 2022-08-18 ENCOUNTER — Inpatient Hospital Stay (HOSPITAL_COMMUNITY)
Admission: RE | Admit: 2022-08-18 | Discharge: 2022-08-24 | DRG: 407 | Disposition: A | Discharge status: Home Health | Payer: Medicare HMO | Attending: Surgery | Admitting: Surgery

## 2022-08-18 ENCOUNTER — Other Ambulatory Visit: Payer: Self-pay

## 2022-08-18 ENCOUNTER — Encounter (HOSPITAL_COMMUNITY): Payer: Self-pay | Admitting: Surgery

## 2022-08-18 ENCOUNTER — Inpatient Hospital Stay (HOSPITAL_COMMUNITY): Payer: Medicare HMO | Admitting: Anesthesiology

## 2022-08-18 ENCOUNTER — Inpatient Hospital Stay (HOSPITAL_COMMUNITY): Payer: Medicare HMO | Admitting: Vascular Surgery

## 2022-08-18 ENCOUNTER — Encounter (HOSPITAL_COMMUNITY)
Admission: RE | Disposition: A | Discharge status: Home Health | Payer: Self-pay | Source: Home / Self Care | Attending: Surgery

## 2022-08-18 DIAGNOSIS — Z800 Family history of malignant neoplasm of digestive organs: Secondary | ICD-10-CM | POA: Diagnosis not present

## 2022-08-18 DIAGNOSIS — Z791 Long term (current) use of non-steroidal anti-inflammatories (NSAID): Secondary | ICD-10-CM

## 2022-08-18 DIAGNOSIS — F17200 Nicotine dependence, unspecified, uncomplicated: Secondary | ICD-10-CM | POA: Diagnosis not present

## 2022-08-18 DIAGNOSIS — Z886 Allergy status to analgesic agent status: Secondary | ICD-10-CM | POA: Diagnosis not present

## 2022-08-18 DIAGNOSIS — I10 Essential (primary) hypertension: Secondary | ICD-10-CM

## 2022-08-18 DIAGNOSIS — R634 Abnormal weight loss: Secondary | ICD-10-CM | POA: Diagnosis present

## 2022-08-18 DIAGNOSIS — Z6822 Body mass index (BMI) 22.0-22.9, adult: Secondary | ICD-10-CM

## 2022-08-18 DIAGNOSIS — G8929 Other chronic pain: Secondary | ICD-10-CM | POA: Diagnosis not present

## 2022-08-18 DIAGNOSIS — R066 Hiccough: Secondary | ICD-10-CM | POA: Diagnosis not present

## 2022-08-18 DIAGNOSIS — Z880 Allergy status to penicillin: Secondary | ICD-10-CM | POA: Diagnosis not present

## 2022-08-18 DIAGNOSIS — K8681 Exocrine pancreatic insufficiency: Secondary | ICD-10-CM | POA: Diagnosis not present

## 2022-08-18 DIAGNOSIS — D134 Benign neoplasm of liver: Secondary | ICD-10-CM | POA: Diagnosis not present

## 2022-08-18 DIAGNOSIS — R110 Nausea: Secondary | ICD-10-CM | POA: Diagnosis not present

## 2022-08-18 DIAGNOSIS — K219 Gastro-esophageal reflux disease without esophagitis: Secondary | ICD-10-CM | POA: Diagnosis not present

## 2022-08-18 DIAGNOSIS — K7689 Other specified diseases of liver: Secondary | ICD-10-CM | POA: Diagnosis not present

## 2022-08-18 DIAGNOSIS — K449 Diaphragmatic hernia without obstruction or gangrene: Secondary | ICD-10-CM | POA: Diagnosis not present

## 2022-08-18 DIAGNOSIS — Z7951 Long term (current) use of inhaled steroids: Secondary | ICD-10-CM

## 2022-08-18 DIAGNOSIS — G8918 Other acute postprocedural pain: Secondary | ICD-10-CM | POA: Diagnosis not present

## 2022-08-18 DIAGNOSIS — Z981 Arthrodesis status: Secondary | ICD-10-CM

## 2022-08-18 DIAGNOSIS — Z91048 Other nonmedicinal substance allergy status: Secondary | ICD-10-CM

## 2022-08-18 DIAGNOSIS — C259 Malignant neoplasm of pancreas, unspecified: Principal | ICD-10-CM | POA: Diagnosis present

## 2022-08-18 DIAGNOSIS — F17210 Nicotine dependence, cigarettes, uncomplicated: Secondary | ICD-10-CM | POA: Diagnosis present

## 2022-08-18 DIAGNOSIS — Z885 Allergy status to narcotic agent status: Secondary | ICD-10-CM | POA: Diagnosis not present

## 2022-08-18 DIAGNOSIS — M549 Dorsalgia, unspecified: Secondary | ICD-10-CM | POA: Diagnosis present

## 2022-08-18 DIAGNOSIS — M1990 Unspecified osteoarthritis, unspecified site: Secondary | ICD-10-CM | POA: Diagnosis present

## 2022-08-18 DIAGNOSIS — Z7901 Long term (current) use of anticoagulants: Secondary | ICD-10-CM

## 2022-08-18 DIAGNOSIS — Z888 Allergy status to other drugs, medicaments and biological substances status: Secondary | ICD-10-CM

## 2022-08-18 DIAGNOSIS — Z79891 Long term (current) use of opiate analgesic: Secondary | ICD-10-CM | POA: Diagnosis not present

## 2022-08-18 DIAGNOSIS — D017 Carcinoma in situ of other specified digestive organs: Secondary | ICD-10-CM | POA: Diagnosis not present

## 2022-08-18 DIAGNOSIS — E43 Unspecified severe protein-calorie malnutrition: Secondary | ICD-10-CM | POA: Insufficient documentation

## 2022-08-18 DIAGNOSIS — Z23 Encounter for immunization: Secondary | ICD-10-CM

## 2022-08-18 DIAGNOSIS — C252 Malignant neoplasm of tail of pancreas: Secondary | ICD-10-CM

## 2022-08-18 DIAGNOSIS — K759 Inflammatory liver disease, unspecified: Secondary | ICD-10-CM | POA: Diagnosis not present

## 2022-08-18 HISTORY — PX: OPERATIVE ULTRASOUND: SHX5996

## 2022-08-18 HISTORY — PX: LAPAROSCOPY: SHX197

## 2022-08-18 HISTORY — PX: SPLENECTOMY, TOTAL: SHX788

## 2022-08-18 LAB — CBC
HCT: 32.2 % — ABNORMAL LOW (ref 39.0–52.0)
Hemoglobin: 10.6 g/dL — ABNORMAL LOW (ref 13.0–17.0)
MCH: 29.1 pg (ref 26.0–34.0)
MCHC: 32.9 g/dL (ref 30.0–36.0)
MCV: 88.5 fL (ref 80.0–100.0)
Platelets: 254 10*3/uL (ref 150–400)
RBC: 3.64 MIL/uL — ABNORMAL LOW (ref 4.22–5.81)
RDW: 13.2 % (ref 11.5–15.5)
WBC: 29 10*3/uL — ABNORMAL HIGH (ref 4.0–10.5)
nRBC: 0 % (ref 0.0–0.2)

## 2022-08-18 LAB — BASIC METABOLIC PANEL
Anion gap: 12 (ref 5–15)
BUN: 10 mg/dL (ref 6–20)
CO2: 23 mmol/L (ref 22–32)
Calcium: 7.8 mg/dL — ABNORMAL LOW (ref 8.9–10.3)
Chloride: 102 mmol/L (ref 98–111)
Creatinine, Ser: 0.67 mg/dL (ref 0.61–1.24)
GFR, Estimated: >60 mL/min (ref >60)
Glucose, Bld: 221 mg/dL — ABNORMAL HIGH (ref 70–99)
Potassium: 4.1 mmol/L (ref 3.5–5.1)
Sodium: 137 mmol/L (ref 135–145)

## 2022-08-18 LAB — BPAM RBC
Blood Product Expiration Date: 202407112359
Unit Type and Rh: 600
Unit Type and Rh: 600

## 2022-08-18 LAB — PREPARE RBC (CROSSMATCH)

## 2022-08-18 LAB — GLUCOSE, CAPILLARY
Glucose-Capillary: 226 mg/dL — ABNORMAL HIGH (ref 70–99)
Glucose-Capillary: 211 mg/dL — ABNORMAL HIGH (ref 70–99)

## 2022-08-18 LAB — TYPE AND SCREEN

## 2022-08-18 SURGERY — PANCREATECTOMY
Anesthesia: General | Site: Abdomen

## 2022-08-18 MED ORDER — ENSURE PRE-SURGERY PO LIQD: 592.0000 mL | Freq: Once | ORAL | Status: DC

## 2022-08-18 MED ORDER — PHENYLEPHRINE 80 MCG/ML (10ML) SYRINGE FOR IV PUSH (FOR BLOOD PRESSURE SUPPORT)
PREFILLED_SYRINGE | INTRAVENOUS | Status: DC | PRN
  Administered 2022-08-18 (×2): 80 ug via INTRAVENOUS

## 2022-08-18 MED ORDER — SUGAMMADEX SODIUM 200 MG/2ML IV SOLN
INTRAVENOUS | Status: DC | PRN
  Administered 2022-08-18: 200 mg via INTRAVENOUS

## 2022-08-18 MED ORDER — LACTATED RINGERS IV SOLN: INTRAVENOUS | Status: DC

## 2022-08-18 MED ORDER — BUPIVACAINE HCL 0.25 % IJ SOLN: INTRAMUSCULAR | Status: DC | PRN

## 2022-08-18 MED ORDER — KETAMINE HCL 10 MG/ML IJ SOLN
INTRAMUSCULAR | Status: DC | PRN
  Administered 2022-08-18 (×2): 20 mg via INTRAVENOUS
  Administered 2022-08-18: 10 mg via INTRAVENOUS

## 2022-08-18 MED ORDER — SUCCINYLCHOLINE CHLORIDE 200 MG/10ML IV SOSY
PREFILLED_SYRINGE | INTRAVENOUS | Status: AC
  Filled 2022-08-18: qty 10

## 2022-08-18 MED ORDER — ONDANSETRON HCL 4 MG/2ML IJ SOLN: 4.0000 mg | Freq: Once | INTRAMUSCULAR | Status: DC | PRN

## 2022-08-18 MED ORDER — METRONIDAZOLE 500 MG/100ML IV SOLN
500.0000 mg | INTRAVENOUS | Status: AC
  Administered 2022-08-18: 500 mg via INTRAVENOUS

## 2022-08-18 MED ORDER — FENTANYL CITRATE (PF) 100 MCG/2ML IJ SOLN
25.0000 ug | INTRAMUSCULAR | Status: DC | PRN
  Administered 2022-08-18 (×2): 50 ug via INTRAVENOUS

## 2022-08-18 MED ORDER — ROCURONIUM BROMIDE 10 MG/ML (PF) SYRINGE
PREFILLED_SYRINGE | INTRAVENOUS | Status: DC | PRN
  Administered 2022-08-18: 20 mg via INTRAVENOUS
  Administered 2022-08-18: 100 mg via INTRAVENOUS
  Administered 2022-08-18 (×2): 30 mg via INTRAVENOUS

## 2022-08-18 MED ORDER — PROPOFOL 10 MG/ML IV BOLUS
INTRAVENOUS | Status: AC
  Filled 2022-08-18: qty 20

## 2022-08-18 MED ORDER — METHOCARBAMOL 1000 MG/10ML IJ SOLN
1000.0000 mg | Freq: Three times a day (TID) | INTRAVENOUS | Status: DC
  Administered 2022-08-18 – 2022-08-22 (×11): 1000 mg via INTRAVENOUS
  Filled 2022-08-18 (×3): qty 10
  Filled 2022-08-18: qty 1000
  Filled 2022-08-18: qty 10
  Filled 2022-08-18: qty 1000
  Filled 2022-08-18 (×3): qty 10
  Filled 2022-08-18 (×2): qty 1000
  Filled 2022-08-18: qty 10
  Filled 2022-08-18: qty 1000
  Filled 2022-08-18: qty 10
  Filled 2022-08-18: qty 1000

## 2022-08-18 MED ORDER — FENTANYL CITRATE (PF) 100 MCG/2ML IJ SOLN
INTRAMUSCULAR | Status: AC
  Filled 2022-08-18: qty 2

## 2022-08-18 MED ORDER — ONDANSETRON HCL 4 MG/2ML IJ SOLN: 4.0000 mg | Freq: Four times a day (QID) | INTRAMUSCULAR | Status: DC | PRN

## 2022-08-18 MED ORDER — DIPHENHYDRAMINE HCL 25 MG PO CAPS: 25.0000 mg | ORAL_CAPSULE | Freq: Four times a day (QID) | ORAL | Status: DC | PRN

## 2022-08-18 MED ORDER — 0.9 % SODIUM CHLORIDE (POUR BTL) OPTIME
TOPICAL | Status: DC | PRN
  Administered 2022-08-18 (×3): 1000 mL

## 2022-08-18 MED ORDER — ROCURONIUM BROMIDE 10 MG/ML (PF) SYRINGE
PREFILLED_SYRINGE | INTRAVENOUS | Status: AC
  Filled 2022-08-18: qty 10

## 2022-08-18 MED ORDER — DEXTROSE 5 % IV SOLN
INTRAVENOUS | Status: DC | PRN
  Administered 2022-08-18: 2 g via INTRAVENOUS

## 2022-08-18 MED ORDER — ROPIVACAINE HCL 2 MG/ML IJ SOLN
5.0000 mL/h | INTRAMUSCULAR | Status: DC
  Administered 2022-08-18: 8 mL/h via EPIDURAL
  Filled 2022-08-18 (×9): qty 20

## 2022-08-18 MED ORDER — ENSURE PRE-SURGERY PO LIQD: 296.0000 mL | Freq: Once | ORAL | Status: DC

## 2022-08-18 MED ORDER — LORAZEPAM 2 MG/ML IJ SOLN
0.5000 mg | Freq: Two times a day (BID) | INTRAMUSCULAR | Status: DC | PRN
  Administered 2022-08-18 – 2022-08-20 (×3): 0.5 mg via INTRAVENOUS
  Filled 2022-08-18 (×3): qty 1

## 2022-08-18 MED ORDER — ONDANSETRON 4 MG PO TBDP
4.0000 mg | ORAL_TABLET | Freq: Four times a day (QID) | ORAL | Status: DC | PRN
  Administered 2022-08-21: 4 mg via ORAL
  Filled 2022-08-18: qty 1

## 2022-08-18 MED ORDER — DIPHENHYDRAMINE HCL 50 MG/ML IJ SOLN: 25.0000 mg | Freq: Four times a day (QID) | INTRAMUSCULAR | Status: DC | PRN

## 2022-08-18 MED ORDER — LIDOCAINE IN D5W 4-5 MG/ML-% IV SOLN
1.0000 mg/min | INTRAVENOUS | Status: DC
  Filled 2022-08-18: qty 500

## 2022-08-18 MED ORDER — CHLORHEXIDINE GLUCONATE CLOTH 2 % EX PADS: 6.0000 | MEDICATED_PAD | Freq: Once | CUTANEOUS | Status: DC

## 2022-08-18 MED ORDER — PANTOPRAZOLE SODIUM 40 MG IV SOLR
40.0000 mg | Freq: Two times a day (BID) | INTRAVENOUS | Status: DC
  Administered 2022-08-18 – 2022-08-19 (×3): 40 mg via INTRAVENOUS
  Filled 2022-08-18 (×3): qty 10

## 2022-08-18 MED ORDER — SODIUM CHLORIDE 0.9% FLUSH: 9.0000 mL | INTRAVENOUS | Status: DC | PRN

## 2022-08-18 MED ORDER — PHENYLEPHRINE 80 MCG/ML (10ML) SYRINGE FOR IV PUSH (FOR BLOOD PRESSURE SUPPORT)
PREFILLED_SYRINGE | INTRAVENOUS | Status: AC
  Filled 2022-08-18: qty 10

## 2022-08-18 MED ORDER — DEXAMETHASONE SODIUM PHOSPHATE 10 MG/ML IJ SOLN
INTRAMUSCULAR | Status: AC
  Filled 2022-08-18: qty 1

## 2022-08-18 MED ORDER — CHLORHEXIDINE GLUCONATE 0.12 % MT SOLN
OROMUCOSAL | Status: AC
  Administered 2022-08-18: 15 mL via OROMUCOSAL
  Filled 2022-08-18: qty 15

## 2022-08-18 MED ORDER — FENTANYL CITRATE (PF) 250 MCG/5ML IJ SOLN
INTRAMUSCULAR | Status: AC
  Filled 2022-08-18: qty 5

## 2022-08-18 MED ORDER — DEXAMETHASONE SODIUM PHOSPHATE 10 MG/ML IJ SOLN
INTRAMUSCULAR | Status: DC | PRN
  Administered 2022-08-18: 4 mg via INTRAVENOUS

## 2022-08-18 MED ORDER — MIDAZOLAM HCL 2 MG/2ML IJ SOLN
INTRAMUSCULAR | Status: DC | PRN
  Administered 2022-08-18: 2 mg via INTRAVENOUS

## 2022-08-18 MED ORDER — INSULIN ASPART 100 UNIT/ML IJ SOLN
0.0000 [IU] | INTRAMUSCULAR | Status: DC
  Administered 2022-08-18 (×2): 3 [IU] via SUBCUTANEOUS
  Administered 2022-08-19: 1 [IU] via SUBCUTANEOUS
  Administered 2022-08-19 (×2): 2 [IU] via SUBCUTANEOUS
  Administered 2022-08-19 – 2022-08-20 (×5): 1 [IU] via SUBCUTANEOUS
  Administered 2022-08-21: 2 [IU] via SUBCUTANEOUS
  Administered 2022-08-21 (×2): 1 [IU] via SUBCUTANEOUS

## 2022-08-18 MED ORDER — ORAL CARE MOUTH RINSE: 15.0000 mL | Freq: Once | OROMUCOSAL | Status: AC

## 2022-08-18 MED ORDER — ONDANSETRON HCL 4 MG/2ML IJ SOLN
INTRAMUSCULAR | Status: AC
  Filled 2022-08-18: qty 2

## 2022-08-18 MED ORDER — LACTATED RINGERS IV SOLN: INTRAVENOUS | Status: DC | PRN

## 2022-08-18 MED ORDER — HEMOSTATIC AGENTS (NO CHARGE) OPTIME
TOPICAL | Status: DC | PRN
  Administered 2022-08-18: 1 via TOPICAL

## 2022-08-18 MED ORDER — NICOTINE 7 MG/24HR TD PT24
7.0000 mg | MEDICATED_PATCH | Freq: Every day | TRANSDERMAL | Status: DC
  Administered 2022-08-18 – 2022-08-24 (×7): 7 mg via TRANSDERMAL
  Filled 2022-08-18 (×7): qty 1

## 2022-08-18 MED ORDER — ACETAMINOPHEN 325 MG PO TABS: 325.0000 mg | ORAL_TABLET | ORAL | Status: DC | PRN

## 2022-08-18 MED ORDER — MEPERIDINE HCL 25 MG/ML IJ SOLN: 6.2500 mg | INTRAMUSCULAR | Status: DC | PRN

## 2022-08-18 MED ORDER — KETAMINE HCL 50 MG/5ML IJ SOSY
PREFILLED_SYRINGE | INTRAMUSCULAR | Status: AC
  Filled 2022-08-18: qty 5

## 2022-08-18 MED ORDER — OXYCODONE HCL 5 MG/5ML PO SOLN: 5.0000 mg | Freq: Once | ORAL | Status: DC | PRN

## 2022-08-18 MED ORDER — HYDROMORPHONE HCL 1 MG/ML IJ SOLN
INTRAMUSCULAR | Status: DC | PRN
  Administered 2022-08-18: .5 mg via INTRAVENOUS

## 2022-08-18 MED ORDER — SODIUM CHLORIDE 0.9 % IV SOLN: 2.0000 g | INTRAVENOUS | Status: DC

## 2022-08-18 MED ORDER — OXYCODONE HCL 5 MG PO TABS: 5.0000 mg | ORAL_TABLET | Freq: Once | ORAL | Status: DC | PRN

## 2022-08-18 MED ORDER — LIDOCAINE 2% (20 MG/ML) 5 ML SYRINGE
INTRAMUSCULAR | Status: AC
  Filled 2022-08-18: qty 5

## 2022-08-18 MED ORDER — NALOXONE HCL 0.4 MG/ML IJ SOLN: 0.4000 mg | INTRAMUSCULAR | Status: DC | PRN

## 2022-08-18 MED ORDER — ETOMIDATE 2 MG/ML IV SOLN
INTRAVENOUS | Status: AC
  Filled 2022-08-18: qty 10

## 2022-08-18 MED ORDER — HEPARIN SODIUM (PORCINE) 5000 UNIT/ML IJ SOLN
5000.0000 [IU] | Freq: Three times a day (TID) | INTRAMUSCULAR | Status: AC
  Administered 2022-08-19 – 2022-08-21 (×9): 5000 [IU] via SUBCUTANEOUS
  Filled 2022-08-18 (×9): qty 1

## 2022-08-18 MED ORDER — BUPIVACAINE HCL (PF) 0.25 % IJ SOLN
INTRAMUSCULAR | Status: AC
  Filled 2022-08-18: qty 30

## 2022-08-18 MED ORDER — HYDROMORPHONE HCL 1 MG/ML IJ SOLN
INTRAMUSCULAR | Status: AC
  Filled 2022-08-18: qty 0.5

## 2022-08-18 MED ORDER — SODIUM CHLORIDE 0.9 % IV SOLN: INTRAVENOUS | Status: DC | PRN

## 2022-08-18 MED ORDER — ONDANSETRON HCL 4 MG/2ML IJ SOLN
INTRAMUSCULAR | Status: DC | PRN
  Administered 2022-08-18: 4 mg via INTRAVENOUS

## 2022-08-18 MED ORDER — FENTANYL CITRATE (PF) 250 MCG/5ML IJ SOLN
INTRAMUSCULAR | Status: DC | PRN
  Administered 2022-08-18 (×2): 50 ug via INTRAVENOUS
  Administered 2022-08-18: 100 ug via INTRAVENOUS
  Administered 2022-08-18: 50 ug via INTRAVENOUS

## 2022-08-18 MED ORDER — HYDROMORPHONE 1 MG/ML IV SOLN
INTRAVENOUS | Status: DC
  Administered 2022-08-18: 30 mg via INTRAVENOUS
  Administered 2022-08-18: 5.1 mg via INTRAVENOUS
  Administered 2022-08-19: 6.3 mg via INTRAVENOUS
  Administered 2022-08-19: 30 mg via INTRAVENOUS
  Administered 2022-08-19: 6.9 mg via INTRAVENOUS
  Administered 2022-08-19: 5.1 mg via INTRAVENOUS
  Administered 2022-08-19: 5.4 mg via INTRAVENOUS
  Administered 2022-08-20: 6 mg via INTRAVENOUS
  Administered 2022-08-20: 5.1 mg via INTRAVENOUS
  Administered 2022-08-20 (×2): 30 mg via INTRAVENOUS
  Administered 2022-08-20: 3.9 mg via INTRAVENOUS
  Administered 2022-08-20: 5.4 mg via INTRAVENOUS
  Administered 2022-08-20: 4.8 mg via INTRAVENOUS
  Filled 2022-08-18 (×4): qty 30

## 2022-08-18 MED ORDER — PROPOFOL 10 MG/ML IV BOLUS
INTRAVENOUS | Status: DC | PRN
  Administered 2022-08-18: 150 mg via INTRAVENOUS

## 2022-08-18 MED ORDER — ROSUVASTATIN CALCIUM 20 MG PO TABS
20.0000 mg | ORAL_TABLET | Freq: Every day | ORAL | Status: DC
  Administered 2022-08-19 – 2022-08-24 (×6): 20 mg via ORAL
  Filled 2022-08-18 (×6): qty 1

## 2022-08-18 MED ORDER — MIDAZOLAM HCL 2 MG/2ML IJ SOLN
INTRAMUSCULAR | Status: AC
  Filled 2022-08-18: qty 2

## 2022-08-18 MED ORDER — SODIUM CHLORIDE 0.9 % IV SOLN
INTRAVENOUS | Status: AC
  Filled 2022-08-18: qty 20

## 2022-08-18 MED ORDER — METRONIDAZOLE 500 MG/100ML IV SOLN
INTRAVENOUS | Status: AC
  Filled 2022-08-18: qty 100

## 2022-08-18 MED ORDER — ALBUMIN HUMAN 5 % IV SOLN: INTRAVENOUS | Status: DC | PRN

## 2022-08-18 MED ORDER — MOMETASONE FURO-FORMOTEROL FUM 200-5 MCG/ACT IN AERO: 2.0000 | INHALATION_SPRAY | Freq: Two times a day (BID) | RESPIRATORY_TRACT | Status: DC | PRN

## 2022-08-18 MED ORDER — CHLORHEXIDINE GLUCONATE 0.12 % MT SOLN: 15.0000 mL | Freq: Once | OROMUCOSAL | Status: AC

## 2022-08-18 MED ORDER — EPHEDRINE 5 MG/ML INJ
INTRAVENOUS | Status: AC
  Filled 2022-08-18: qty 5

## 2022-08-18 MED ORDER — PHENYLEPHRINE HCL-NACL 20-0.9 MG/250ML-% IV SOLN
INTRAVENOUS | Status: DC | PRN
  Administered 2022-08-18: 25 ug/min via INTRAVENOUS

## 2022-08-18 MED ORDER — ACETAMINOPHEN 160 MG/5ML PO SOLN: 325.0000 mg | ORAL | Status: DC | PRN

## 2022-08-18 MED ORDER — ACETAMINOPHEN 10 MG/ML IV SOLN
1000.0000 mg | Freq: Three times a day (TID) | INTRAVENOUS | Status: AC
  Administered 2022-08-18 – 2022-08-19 (×3): 1000 mg via INTRAVENOUS
  Filled 2022-08-18 (×3): qty 100

## 2022-08-18 SURGICAL SUPPLY — 124 items
ADH SKN CLS APL DERMABOND .7 (GAUZE/BANDAGES/DRESSINGS) ×1
APL PRP STRL LF DISP 70% ISPRP (MISCELLANEOUS) ×1
BAG BILE T-TUBES STRL (MISCELLANEOUS) IMPLANT
BAG COUNTER SPONGE SURGICOUNT (BAG) ×1 IMPLANT
BAG DRN 9.5 2 ADJ BELT ADPR (MISCELLANEOUS)
BAG SPNG CNTER NS LX DISP (BAG) ×1
BIOPATCH RED 1 DISK 7.0 (GAUZE/BANDAGES/DRESSINGS) ×1 IMPLANT
BLADE CLIPPER SURG (BLADE) IMPLANT
BLADE EXTENDED COATED 6.5IN (ELECTRODE) IMPLANT
BLADE SURG 10 STRL SS (BLADE) IMPLANT
BOOT SUTURE VASCULAR YLW (MISCELLANEOUS) ×1
CANISTER SUCT 3000ML PPV (MISCELLANEOUS) IMPLANT
CHLORAPREP W/TINT 26 (MISCELLANEOUS) ×1 IMPLANT
CLAMP SUTURE YELLOW 5 PAIRS (MISCELLANEOUS) IMPLANT
CLIP TI LARGE 6 (CLIP) IMPLANT
CLIP TI MEDIUM 24 (CLIP) IMPLANT
CLIP TI WIDE RED SMALL 24 (CLIP) IMPLANT
COVER MAYO STAND STRL (DRAPES) IMPLANT
COVER SURGICAL LIGHT HANDLE (MISCELLANEOUS) ×1 IMPLANT
DERMABOND ADVANCED .7 DNX12 (GAUZE/BANDAGES/DRESSINGS) ×1 IMPLANT
DISSECTOR SURG LIGASURE 21 (MISCELLANEOUS) IMPLANT
DRAIN CHANNEL 19F RND (DRAIN) ×1 IMPLANT
DRAIN PENROSE 0.25X18 (DRAIN) IMPLANT
DRAPE INCISE IOBAN 66X45 STRL (DRAPES) ×1 IMPLANT
DRAPE WARM FLUID 44X44 (DRAPES) ×1 IMPLANT
DRSG TEGADERM 4X4.75 (GAUZE/BANDAGES/DRESSINGS) ×1 IMPLANT
DRSG TELFA 3X8 NADH STRL (GAUZE/BANDAGES/DRESSINGS) IMPLANT
ELECT BLADE 6.5 EXT (BLADE) ×1 IMPLANT
ELECT CAUTERY BLADE 6.4 (BLADE) ×1 IMPLANT
ELECT PAD DSPR THERM+ ADLT (MISCELLANEOUS) IMPLANT
ELECT REM PT RETURN 9FT ADLT (ELECTROSURGICAL) ×1
ELECTRODE REM PT RTRN 9FT ADLT (ELECTROSURGICAL) ×1 IMPLANT
EVACUATOR SILICONE 100CC (DRAIN) ×1 IMPLANT
GAUZE 4X4 16PLY ~~LOC~~+RFID DBL (SPONGE) IMPLANT
GAUZE SPONGE 2X2 8PLY STRL LF (GAUZE/BANDAGES/DRESSINGS) IMPLANT
GAUZE SPONGE 2X2 STRL 8-PLY (GAUZE/BANDAGES/DRESSINGS) IMPLANT
GAUZE SPONGE 4X4 12PLY STRL (GAUZE/BANDAGES/DRESSINGS) IMPLANT
GLOVE BIOGEL PI IND STRL 7.0 (GLOVE) IMPLANT
GLOVE BIOGEL PI IND STRL 7.5 (GLOVE) IMPLANT
GLOVE SURG POLY MICRO LF SZ5.5 (GLOVE) ×1 IMPLANT
GLOVE SURG SS PI 7.0 STRL IVOR (GLOVE) IMPLANT
GLOVE SURG UNDER POLY LF SZ6 (GLOVE) ×1 IMPLANT
GOWN STRL REUS W/ TWL LRG LVL3 (GOWN DISPOSABLE) ×2 IMPLANT
GOWN STRL REUS W/TWL LRG LVL3 (GOWN DISPOSABLE) ×4
HAND PENCIL TRP OPTION (MISCELLANEOUS) IMPLANT
HANDLE SUCTION POOLE (INSTRUMENTS) ×1 IMPLANT
HEMOSTAT ARISTA ABSORB 3G PWDR (HEMOSTASIS) IMPLANT
HEMOSTAT SNOW SURGICEL 2X4 (HEMOSTASIS) IMPLANT
IRRIG SUCT STRYKERFLOW 2 WTIP (MISCELLANEOUS)
IRRIGATION SUCT STRKRFLW 2 WTP (MISCELLANEOUS) IMPLANT
KIT BASIN OR (CUSTOM PROCEDURE TRAY) ×1 IMPLANT
KIT MARKER MARGIN INK (KITS) IMPLANT
KIT TURNOVER KIT B (KITS) ×1 IMPLANT
LIGASURE IMPACT 36 18CM CVD LR (INSTRUMENTS) IMPLANT
LOOP VASCLR MAXI BLUE 18IN ST (MISCELLANEOUS) ×1 IMPLANT
LOOP VASCULAR MAXI 18 BLUE (MISCELLANEOUS) ×1
LOOP VASCULAR MINI 18 RED (MISCELLANEOUS) ×1
LOOPS VASCLR MAXI BLUE 18IN ST (MISCELLANEOUS) ×1 IMPLANT
NDL INSUFFLATION 14GA 120MM (NEEDLE) ×1 IMPLANT
NEEDLE INSUFFLATION 14GA 120MM (NEEDLE) ×1 IMPLANT
NS IRRIG 1000ML POUR BTL (IV SOLUTION) ×1 IMPLANT
PACK GENERAL/GYN (CUSTOM PROCEDURE TRAY) ×1 IMPLANT
PAD ARMBOARD 7.5X6 YLW CONV (MISCELLANEOUS) ×2 IMPLANT
PENCIL SMOKE EVACUATOR (MISCELLANEOUS) IMPLANT
RELOAD STAPLE 60 2.6 WHT THN (STAPLE) IMPLANT
RELOAD STAPLE 60 3.6 BLU REG (STAPLE) IMPLANT
RELOAD STAPLE 60 3.8 GOLD REG (STAPLE) IMPLANT
RELOAD STAPLE 60 4.1 GRN THCK (STAPLE) IMPLANT
RELOAD STAPLER BLUE 60MM (STAPLE) IMPLANT
RELOAD STAPLER GOLD 60MM (STAPLE) IMPLANT
RELOAD STAPLER GREEN 60MM (STAPLE) IMPLANT
RELOAD STAPLER WHITE 60MM (STAPLE) IMPLANT
RETRACTOR WOUND ALXS 34CM XLRG (MISCELLANEOUS) ×1 IMPLANT
RTRCTR WOUND ALEXIS 34CM XLRG (MISCELLANEOUS) ×1
SCISSORS LAP 5X35 DISP (ENDOMECHANICALS) IMPLANT
SET TUBE SMOKE EVAC HIGH FLOW (TUBING) ×1 IMPLANT
SHEARS FOC LG CVD HARMONIC 17C (MISCELLANEOUS) IMPLANT
SLEEVE Z-THREAD 5X100MM (TROCAR) ×1 IMPLANT
SPONGE T-LAP 18X18 ~~LOC~~+RFID (SPONGE) ×1 IMPLANT
STAPLE ECHEON FLEX 60 POW ENDO (STAPLE) IMPLANT
STAPLE LINE REINFORCEMENT LAP (STAPLE) IMPLANT
STAPLER RELOAD BLUE 60MM (STAPLE)
STAPLER RELOAD GOLD 60MM (STAPLE)
STAPLER RELOAD GREEN 60MM (STAPLE)
STAPLER RELOAD WHITE 60MM (STAPLE)
STAPLER VISISTAT 35W (STAPLE) IMPLANT
SUCTION POOLE HANDLE (INSTRUMENTS) ×1
SUT ETHILON 2 0 FS 18 (SUTURE) ×1 IMPLANT
SUT MNCRL AB 4-0 PS2 18 (SUTURE) ×1 IMPLANT
SUT PDS 5 0 RB 1 (SUTURE) IMPLANT
SUT PDS AB 1 TP1 54 (SUTURE) IMPLANT
SUT PDS AB 1 TP1 96 (SUTURE) ×2 IMPLANT
SUT PDS II 5-0 RB-2 VIOLET (SUTURE) IMPLANT
SUT PROLENE 2 0 SH 30 (SUTURE) IMPLANT
SUT PROLENE 3 0 SH 48 (SUTURE) ×1 IMPLANT
SUT PROLENE 4 0 RB 1 (SUTURE) ×2
SUT PROLENE 4-0 RB1 .5 CRCL 36 (SUTURE) ×1 IMPLANT
SUT SILK 2 0 TIES 10X30 (SUTURE) ×1 IMPLANT
SUT SILK 2 0SH CR/8 30 (SUTURE) ×1 IMPLANT
SUT SILK 3 0 TIES 10X30 (SUTURE) ×1 IMPLANT
SUT SILK 3 0SH CR/8 30 (SUTURE) ×1 IMPLANT
SUT VIC AB 2-0 CT1 27 (SUTURE)
SUT VIC AB 2-0 CT1 TAPERPNT 27 (SUTURE) IMPLANT
SUT VIC AB 2-0 SH 18 (SUTURE) IMPLANT
SUT VIC AB 3-0 MH 27 (SUTURE) IMPLANT
SUT VIC AB 3-0 SH 18 (SUTURE) IMPLANT
SUT VIC AB 3-0 SH 27 (SUTURE) ×2
SUT VIC AB 3-0 SH 27X BRD (SUTURE) IMPLANT
SUT VIC AB 4-0 SH 18 (SUTURE) IMPLANT
SUT VICRYL 2 0 18 UND BR (SUTURE) IMPLANT
SUT VICRYL 3 0 BR 18 UND (SUTURE) IMPLANT
SYR BULB IRRIG 60ML STRL (SYRINGE) ×1 IMPLANT
TAG SUTURE CLAMP YLW 5PR (MISCELLANEOUS) ×1
TOWEL GREEN STERILE (TOWEL DISPOSABLE) ×1 IMPLANT
TOWEL GREEN STERILE FF (TOWEL DISPOSABLE) ×1 IMPLANT
TRAY FOLEY MTR SLVR 16FR STAT (SET/KITS/TRAYS/PACK) ×1 IMPLANT
TRAY LAPAROSCOPIC MC (CUSTOM PROCEDURE TRAY) ×1 IMPLANT
TROCAR BALLN 12MMX100 BLUNT (TROCAR) IMPLANT
TROCAR Z-THREAD OPTICAL 5X100M (TROCAR) ×1 IMPLANT
TUBE CONNECTING 12X1/4 (SUCTIONS) IMPLANT
VASCULAR TIE MAXI BLUE 18IN ST (MISCELLANEOUS) ×1
VASCULAR TIE MINI RED 18IN STL (MISCELLANEOUS) ×1 IMPLANT
WARMER LAPAROSCOPE (MISCELLANEOUS) ×1 IMPLANT
YANKAUER SUCT BULB TIP NO VENT (SUCTIONS) IMPLANT

## 2022-08-18 NOTE — Anesthesia Procedure Notes (Signed)
Arterial Line Insertion Start/End6/02/2023 10:15 AM, 08/18/2022 10:15 AM Performed by: CRNA  Patient location: OOR procedure area. Preanesthetic checklist: patient identified, IV checked, site marked, risks and benefits discussed, surgical consent, monitors and equipment checked, pre-op evaluation, timeout performed and anesthesia consent Lidocaine 1% used for infiltration Right, radial was placed Catheter size: 20 G Hand hygiene performed , maximum sterile barriers used  and Seldinger technique used Allen's test indicative of satisfactory collateral circulation Attempts: 2 Procedure performed without using ultrasound guided technique. Following insertion, dressing applied and Biopatch. Post procedure assessment: normal  Patient tolerated the procedure well with no immediate complications.

## 2022-08-18 NOTE — Anesthesia Procedure Notes (Signed)
Procedure Name: Intubation Date/Time: 08/18/2022 11:11 AM  Performed by: Randon Goldsmith, CRNAPre-anesthesia Checklist: Patient identified, Emergency Drugs available, Suction available and Patient being monitored Patient Re-evaluated:Patient Re-evaluated prior to induction Oxygen Delivery Method: Circle system utilized Preoxygenation: Pre-oxygenation with 100% oxygen Induction Type: IV induction Ventilation: Mask ventilation without difficulty Laryngoscope Size: Glidescope and 4 Grade View: Grade I Tube type: Oral Tube size: 7.5 mm Number of attempts: 1 Airway Equipment and Method: Stylet and Video-laryngoscopy Placement Confirmation: ETT inserted through vocal cords under direct vision, positive ETCO2 and breath sounds checked- equal and bilateral Secured at: 22 cm Tube secured with: Tape Dental Injury: Teeth and Oropharynx as per pre-operative assessment  Comments: Glide used due to neck rotation and lose bottom tooth. Placed by Terrial Rhodes

## 2022-08-18 NOTE — Plan of Care (Signed)

## 2022-08-18 NOTE — H&P (Signed)
David Harrell is an 62 y.o. male.   Chief Complaint: pancreatic mass HPI: David Harrell is a 62 y.o. male who was referred to me for evaluation of a newly-diagnosed pancreatic adenocarcinoma. He was admitted in January with acute pancreatitis, primarily involving the tail on imaging. Earlier this month was admitted with LUQ pain and nausea, and was found to have a cystic mass in the tail of the pancreas. CA19-9 was elevated at 130. He underwent an EUS by Dr. Meridee Score on 5/6, which showed an irregular mass in the tail of the pancreas measuring 2.5cm. There were solid and cystic components, suspicious for an underlying IPMN. FNA confirmed adenocarcinoma. He has already seen oncology (Dr. Orlie Dakin) and was referred to me to consider surgical resection. He had a staging PET/CT on 5/15 which did not show any evidence of metastatic disease.   He says he continues to have frequent abdominal pain, especially after eating. This has been ongoing since January. He reports a 40-lb weight loss in the last few months. He is not drinking protein shakes due to cost. He recently saw palliative medicine for pain management and is on MS Contin and oxycodone. He has not had any prior abdominal surgeries. He has no cardiopulmonary disease that he is aware of, although he is a long-time smoker for about 40 years. He ambulates with the assistance of a cane, which he says he has done for several years due to chronic back pain; he has had multiple back surgeries. Since his office visit, he has continued to have abdominal pain. He saw nutrition and has been drinking 3-4 Ensures per day. He was not able to get his splenectomy vaccines.  He has no known family history of pancreatic cancer.   Past Medical History:  Diagnosis Date   Arthritis    GERD (gastroesophageal reflux disease)    Hiatal hernia    History of kidney stones    Hypertension    Pancreatic cancer (HCC) 2024    Past Surgical History:  Procedure Laterality  Date   ANTERIOR CERVICAL DECOMP/DISCECTOMY FUSION     BACK SURGERY     x5   ESOPHAGOGASTRODUODENOSCOPY N/A 07/12/2022   Procedure: ESOPHAGOGASTRODUODENOSCOPY (EGD);  Surgeon: Lemar Lofty., MD;  Location: [facility];  Service: Gastroenterology;  Laterality: N/A;   ESOPHAGOGASTRODUODENOSCOPY (EGD) WITH PROPOFOL N/A 07/05/2022   Procedure: ESOPHAGOGASTRODUODENOSCOPY (EGD) WITH PROPOFOL;  Surgeon: [name], DO;  Location: [facility] ENDOSCOPY;  Service: Gastroenterology;  Laterality: N/A;   EUS N/A 07/12/2022   Procedure: UPPER ENDOSCOPIC ULTRASOUND (EUS) RADIAL;  Surgeon: Lemar Lofty., MD;  Location: [facility] ENDOSCOPY;  Service: Gastroenterology;  Laterality: N/A;   FINE NEEDLE ASPIRATION  07/12/2022   Procedure: FINE NEEDLE ASPIRATION;  Surgeon: Meridee Score, Netty Starring., MD;  Location: [facility] ENDOSCOPY;  Service: Gastroenterology;;   SHOULDER ARTHROSCOPY Bilateral    possibly metal in right shoulder, pt unsure    Family History  Problem Relation Age of Onset   Liver cancer Sister    Spina bifida Paternal Grandfather    Social History:  reports that he has been smoking cigarettes. He has a 20.00 pack-year smoking history. He has never used smokeless tobacco. He reports that he does not currently use alcohol. He reports current drug use. Frequency: 1.00 time per week. Drug: Marijuana.  Allergies:  Allergies  Allergen Reactions   Aspirin     Acid reflux     Pregabalin     Dizziness   Tape     Blisters skin, paper  tape is ok   Codeine Palpitations    headaches   Oxymorphone Rash   Penicillins Hives and Rash   Vicodin [Hydrocodone-Acetaminophen] Palpitations    Headaches     Medications Prior to Admission  Medication Sig Dispense Refill   ALPRAZolam (XANAX) 1 MG tablet Take 1 mg by mouth 2 (two) times daily.     budesonide-formoterol (SYMBICORT) 160-4.5 MCG/ACT inhaler Inhale 2 puffs into the lungs 2 (two) times daily as needed (shortness of breath).      lisinopril (ZESTRIL) 20 MG tablet Take 20 mg by mouth daily.     meloxicam (MOBIC) 15 MG tablet Take 15 mg by mouth daily.     morphine (MS CONTIN) 30 MG 12 hr tablet Take 1 tablet (30 mg total) by mouth every 8 (eight) hours. 90 tablet 0   omeprazole (PRILOSEC OTC) 20 MG tablet Take 20 mg by mouth daily.     ondansetron (ZOFRAN) 4 MG tablet Take 1 tablet (4 mg total) by mouth every 6 (six) hours as needed for nausea. 20 tablet 0   Oxycodone HCl 10 MG TABS Take 10 mg by mouth every 4 (four) hours as needed (pain).     pantoprazole (PROTONIX) 40 MG tablet Take 1 tablet (40 mg total) by mouth 2 (two) times daily. 60 tablet 0   rosuvastatin (CRESTOR) 20 MG tablet Take 20 mg by mouth daily.     apixaban (ELIQUIS) 5 MG TABS tablet Take 2 tablets (10 mg total) by mouth 2 (two) times daily for 7 days, THEN 1 tablet (5 mg total) 2 (two) times daily for 23 days. HOLD THIS MEDICATION FOR 2 DAYS PRIOR TO YOUR BIOPSY PROCEDURE. (Patient not taking: Reported on 08/11/2022) 74 tablet 0    Results for orders placed or performed during the hospital encounter of 08/18/22 (from the past 48 hour(s))  ABO/Rh     Status: None (Preliminary result)   Collection Time: 08/18/22  9:45 AM  Result Value Ref Range   ABO/RH(D) PENDING   Prepare RBC (crossmatch)     Status: None   Collection Time: 08/18/22 10:00 AM  Result Value Ref Range   Order Confirmation      ORDER PROCESSED BY BLOOD BANK Performed at [facility], [address], [place], [place] [address]    No results found.  Review of Systems  Blood pressure 116/79, pulse 73, temperature 97.8 F (36.6 C), temperature source Oral, resp. rate 20, height 5\' 8"  (1.727 m), weight 65.8 kg, SpO2 95 %. Physical Exam Constitutional:      General: He is not in acute distress.    Appearance: Normal appearance.  HENT:     Head: Normocephalic and atraumatic.  Eyes:     General: No scleral icterus.    Conjunctiva/sclera: Conjunctivae normal.  Cardiovascular:      Rate and Rhythm: Normal rate and regular rhythm.  Pulmonary:     Effort: Pulmonary effort is normal. No respiratory distress.  Abdominal:     General: There is no distension.     Palpations: Abdomen is soft.  Musculoskeletal:        General: Normal range of motion.  Skin:    General: Skin is warm and dry.     Coloration: Skin is not jaundiced.  Neurological:     General: No focal deficit present.     Mental Status: He is alert and oriented to person, place, and time.      Assessment/Plan 62 yo male with adenocarcinoma of  the pancreatic tail. Proceed to the OR for staging laparoscopy and open distal pancreatectomy with splenectomy. Admit to progressive care postoperatively. He will be given post-splenectomy vaccines prior to discharge.  Fritzi Mandes, MD 08/18/2022, 10:02 AM

## 2022-08-18 NOTE — Op Note (Signed)
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 is a 62 yo male who presented with LUQ abdominal pain and weight loss. He was found to have a solid and cystic mass in the tail of the pancreas. EUS with FNA confirmed adenocarcinoma, and staging workup showed no metastatic disease. After a discussion of the risks and benefits of surgery, he agreed to proceed with distal pancreatectomy.  Findings: Benign liver nodule, no evidence of metastatic disease within the abdomen. Bulky firm mass in the tail of the pancreas. High grade dysplasia was present at the initial pancreatic neck margin, so an additional margin was sent. At this point we did not feel a further margin could be taken without performing a total pancreatectomy, so the final margin was not sent for frozen section.  Procedure details: Informed consent was obtained in the preoperative area prior to the procedure. The patient was brought to the operating room and placed on the table in the supine position. General anesthesia was induced and appropriate lines and drains were placed for intraoperative monitoring. Perioperative antibiotics were administered per SCIP guidelines. The abdomen was prepped and draped in the usual sterile fashion. A pre-procedure timeout was taken verifying patient identity, surgical site and procedure to be performed.  A small supraumbilical skin incision was made and the subcutaneous tissue was bluntly spread.  The umbilical stalk was  grasped and elevated and a Veress needle was inserted through the fascia.  Intraperitoneal placement was confirmed with the saline drop test and the abdomen was insufflated.  A 5 mm Visiport was placed.  The peritoneal cavity was inspected including the liver, both hemidiaphragms, and the peritoneal surface, and there was no evidence of metastatic disease within the abdomen.  The port was removed and the abdomen was desufflated.  An upper midline skin incision was made, the subcutaneous tissue was divided with cautery to expose the fascia, and the fascia was opened along the linea alba.  The peritoneum was opened and an Horticulturist, commercial and Bookwalter fixed retractor were placed.  The liver was palpated and there was a small firm nodule palpable in the left lateral liver.  This was approximately 2 mm in diameter and had not been easily visible on laparoscopy.  This was sharply excised and sent for frozen section, and was negative for malignant cells.  Hemostasis was achieved to the biopsy site using cautery.  The gastrocolic omentum was opened using LigaSure.  This dissection was carried proximally along the greater curve towards the fundus of the stomach.  The short gastric vessels were divided with LigaSure.  There were minimal thin adhesions between the tail of the pancreas and the posterior stomach along the lesser curve, consistent with prior pancreatitis.  These adhesions were divided with cautery.  There was a large firm mass within the tail of the pancreas corresponding to the known malignancy.  The lesser omentum was opened with cautery.  Small vessels within the lesser omentum were adherent to the tumor in the tail of the pancreas, and these were divided with LigaSure.  The splenic flexure of the colon was mobilized  and taken down with cautery.  Intraoperative ultrasound was performed to confirm the location of the mass within the tail of the pancreas and to identify the splenic artery and hepatic  artery.  The splenic artery was initially difficult to palpate along the superior border of the pancreas but was able to be identified with ultrasound.  The tissue overlying the artery was bluntly dissected off the artery and divided with cautery.  The splenic artery was then circumferentially dissected out and clamped.  On occlusion of the splenic artery, a strong pulse remained within the common hepatic artery.  The splenic artery was doubly ligated with 2-0 silk ties and a 4-0 Prolene suture ligature and divided.  Next a plane was bluntly created between the body of the pancreas and the retroperitoneum.  The splenic vein was not readily visible, thus we proceeded to divide the parenchyma first.  A penrose was passed behind the body of the pancreas and used to elevate it off the retroperitoneum. The anterior portion of the pancreatic parenchyma was divided with cautery.  The posterior aspect was carefully divided with a scalpel.  This exposed the splenic vein, which was tied with 2-0 silk ties.  A 4-0 Prolene suture ligature was placed on the splenic vein near the insertion into the SMV, and the vein was then divided.  The pancreatic duct margin was sent for frozen section and showed high-grade dysplasia in the pancreatic duct at the margin.  The body and tail of the pancreas were dissected off the retroperitoneum, using LigaSure to divide the attachments.  The splenic attachments were divided with LigaSure, which completely freed the specimen.  The specimen was removed and sent for routine pathology.  The body and neck of the pancreas were then bluntly mobilized off of the SMV.  An additional margin of pancreatic tissue approximately 1cm in thickness was taken, using a scalpel to sharply divide the pancreatic parenchyma.  Hemostasis was achieved on the remnant pancreas using cautery.  At this point the entire anterior surface of the SMV was exposed, and I felt that taking additional pancreatic parenchyma would  likely injure the GDA and require a total pancreatectomy.  Thus this final margin was not sent for frozen section.  The wound was irrigated with warm saline.  There was some oozing from the retroperitoneum and the left adrenal gland, which was controlled with cautery.  There was a small injury to the left adrenal vein, which was oversewn with a 4-0 Prolene figure-of-eight suture.  There was a strong pulse and thrill within the common hepatic artery.  The pancreatic duct on the remnant pancreas was oversewn with a 5-0 PDS horizontal mattress suture.  The end of the remnant pancreas was then closed with 3-0 Vicryl horizontal mattress sutures, which included both the capsule and the parenchymal tissue.  There was a small serosal tear on the posterior body of the stomach, which was oversewn with a 2-0 silk Lembert suture.  There was a mesenteric defect in the distal transverse colon, however the the colon itself appeared pink and well-perfused with no signs of injury.  The mesenteric defect was closed with 2-0 silk figure-of-eight sutures. Metal clips were placed on the short gastric vessel stumps along the greater curve of the stomach. Arista was placed in the surgical site.  A 19 [place] JP drain was placed adjacent to the remnant pancreas and through the splenic fossa and brought out through the left upper quadrant abdominal wall.  It was secured to the  skin with 2-0 nylon suture.  A falciform ligament flap was mobilized and placed over the pancreatic stump.  The wound protector and retractors were removed.  The fascia was closed with a running looped 1 PDS.  Scarpa's layer was closed with a running 3-0 Vicryl suture and the skin was closed with running subcuticular 4-0 Monocryl suture.  Dermabond was applied.  The patient tolerated the procedure well with no apparent complications. All counts were correct x2 at the end of the procedure. The patient was extubated and taken to PACU in stable condition.  Sophronia Simas, MD 08/18/22 2:20 PM

## 2022-08-18 NOTE — Transfer of Care (Signed)
Immediate Anesthesia Transfer of Care Note  [name]  Procedure(s) Performed: OPEN DISTAL PANCREATECTOMY (Abdomen) STAGING LAPAROSCOPY (Abdomen) INTRAOPERATIVE ULTRASOUND (Abdomen) SPLENECTOMY (Abdomen)  Patient Location: PACU  Anesthesia Type:GA combined with regional for post-op pain  Level of Consciousness: awake, alert , and oriented  Airway & Oxygen Therapy: Patient Spontanous Breathing and Patient connected to nasal cannula oxygen  Post-op Assessment: Report given to RN and Post -op Vital signs reviewed and stable  Post vital signs: Reviewed and stable  Last Vitals:  Vitals Value Taken Time  BP 145/76 08/18/22 1434  Temp    Pulse 76 08/18/22 1444  Resp 11 08/18/22 1444  SpO2 95 % 08/18/22 1444  Vitals shown include unvalidated device data.  Last Pain:  Vitals:   08/18/22 1001  TempSrc:   PainSc: 6          Complications: No notable events documented.

## 2022-08-18 NOTE — Anesthesia Postprocedure Evaluation (Signed)
Anesthesia Post Note  [name] [facility]  Procedure(s) Performed: OPEN DISTAL PANCREATECTOMY (Abdomen) STAGING LAPAROSCOPY (Abdomen) INTRAOPERATIVE ULTRASOUND (Abdomen) SPLENECTOMY (Abdomen)     Patient location during evaluation: PACU Anesthesia Type: General Level of consciousness: awake and alert Pain management: pain level controlled Vital Signs Assessment: post-procedure vital signs reviewed and stable Respiratory status: spontaneous breathing, nonlabored ventilation, respiratory function stable and patient connected to nasal cannula oxygen Cardiovascular status: blood pressure returned to baseline and stable Postop Assessment: no apparent nausea or vomiting Anesthetic complications: no   No notable events documented.  Last Vitals:  Vitals:   08/18/22 1800 08/18/22 1833  BP: (!) 141/76 (!) 141/76  Pulse: 87 92  Resp: 15 14  Temp:  36.4 C  SpO2: 99% 98%    Last Pain:  Vitals:   08/18/22 1833  TempSrc: Oral  PainSc:                  Dicky Boer

## 2022-08-18 NOTE — Progress Notes (Signed)
MD paged due to pt requesting nicotine patch.

## 2022-08-19 ENCOUNTER — Encounter (HOSPITAL_COMMUNITY): Payer: Self-pay | Admitting: Anesthesiology

## 2022-08-19 ENCOUNTER — Encounter (HOSPITAL_COMMUNITY): Payer: Self-pay | Admitting: Surgery

## 2022-08-19 DIAGNOSIS — E43 Unspecified severe protein-calorie malnutrition: Secondary | ICD-10-CM | POA: Insufficient documentation

## 2022-08-19 LAB — POCT I-STAT 7, (LYTES, BLD GAS, ICA,H+H)
Acid-base deficit: 1 mmol/L (ref 0.0–2.0)
Bicarbonate: 25.9 mmol/L (ref 20.0–28.0)
Calcium, Ion: 1.14 mmol/L — ABNORMAL LOW (ref 1.15–1.40)
HCT: 35 % — ABNORMAL LOW (ref 39.0–52.0)
Hemoglobin: 11.9 g/dL — ABNORMAL LOW (ref 13.0–17.0)
O2 Saturation: 99 %
Potassium: 3.5 mmol/L (ref 3.5–5.1)
Sodium: 140 mmol/L (ref 135–145)
TCO2: 27 mmol/L (ref 22–32)
pCO2 arterial: 51.4 mmHg — ABNORMAL HIGH (ref 32–48)
pH, Arterial: 7.31 — ABNORMAL LOW (ref 7.35–7.45)
pO2, Arterial: 141 mmHg — ABNORMAL HIGH (ref 83–108)

## 2022-08-19 LAB — BASIC METABOLIC PANEL
Anion gap: 9 (ref 5–15)
BUN: 7 mg/dL (ref 6–20)
CO2: 24 mmol/L (ref 22–32)
Calcium: 8.2 mg/dL — ABNORMAL LOW (ref 8.9–10.3)
Chloride: 105 mmol/L (ref 98–111)
Creatinine, Ser: 0.66 mg/dL (ref 0.61–1.24)
GFR, Estimated: >60 mL/min (ref >60)
Glucose, Bld: 155 mg/dL — ABNORMAL HIGH (ref 70–99)
Potassium: 3.8 mmol/L (ref 3.5–5.1)
Sodium: 138 mmol/L (ref 135–145)

## 2022-08-19 LAB — GLUCOSE, CAPILLARY
Glucose-Capillary: 100 mg/dL — ABNORMAL HIGH (ref 70–99)
Glucose-Capillary: 128 mg/dL — ABNORMAL HIGH (ref 70–99)
Glucose-Capillary: 145 mg/dL — ABNORMAL HIGH (ref 70–99)
Glucose-Capillary: 150 mg/dL — ABNORMAL HIGH (ref 70–99)
Glucose-Capillary: 156 mg/dL — ABNORMAL HIGH (ref 70–99)
Glucose-Capillary: 184 mg/dL — ABNORMAL HIGH (ref 70–99)

## 2022-08-19 LAB — CBC
HCT: 30.8 % — ABNORMAL LOW (ref 39.0–52.0)
Hemoglobin: 10.1 g/dL — ABNORMAL LOW (ref 13.0–17.0)
MCH: 28.8 pg (ref 26.0–34.0)
MCHC: 32.8 g/dL (ref 30.0–36.0)
MCV: 87.7 fL (ref 80.0–100.0)
Platelets: 277 10*3/uL (ref 150–400)
RBC: 3.51 MIL/uL — ABNORMAL LOW (ref 4.22–5.81)
RDW: 13.5 % (ref 11.5–15.5)
WBC: 23.8 10*3/uL — ABNORMAL HIGH (ref 4.0–10.5)
nRBC: 0 % (ref 0.0–0.2)

## 2022-08-19 LAB — HEMOGLOBIN A1C
Hgb A1c MFr Bld: 6.5 % — ABNORMAL HIGH (ref 4.8–5.6)
Mean Plasma Glucose: 139.85 mg/dL

## 2022-08-19 LAB — ABO/RH: ABO/RH(D): A NEG

## 2022-08-19 MED ORDER — CHLORHEXIDINE GLUCONATE CLOTH 2 % EX PADS
6.0000 | MEDICATED_PAD | Freq: Every day | CUTANEOUS | Status: DC
  Administered 2022-08-19 – 2022-08-21 (×3): 6 via TOPICAL

## 2022-08-19 MED ORDER — BOOST / RESOURCE BREEZE PO LIQD CUSTOM
1.0000 | Freq: Three times a day (TID) | ORAL | Status: DC
  Administered 2022-08-19 (×2): 1 via ORAL

## 2022-08-19 MED ORDER — MORPHINE SULFATE ER 15 MG PO TBCR
30.0000 mg | EXTENDED_RELEASE_TABLET | Freq: Three times a day (TID) | ORAL | Status: DC
  Administered 2022-08-19 – 2022-08-24 (×16): 30 mg via ORAL
  Filled 2022-08-19 (×16): qty 2

## 2022-08-19 MED ORDER — ROPIVACAINE HCL 2 MG/ML IJ SOLN
10.0000 mL/h | INTRAMUSCULAR | Status: DC
  Administered 2022-08-19 – 2022-08-21 (×4): 10 mL/h via EPIDURAL
  Filled 2022-08-19 (×6): qty 200

## 2022-08-19 MED ORDER — ORAL CARE MOUTH RINSE: 15.0000 mL | OROMUCOSAL | Status: DC | PRN

## 2022-08-19 NOTE — Anesthesia Procedure Notes (Signed)
Epidural Patient location during procedure: post-op Start time: 08/18/2022 10:40 AM End time: 08/18/2022 10:45 AM  Staffing Anesthesiologist: Bethena Midget, MD  Preanesthetic Checklist Completed: patient identified, IV checked, site marked, risks and benefits discussed, surgical consent, monitors and equipment checked, pre-op evaluation and timeout performed  Epidural Patient position: sitting Prep: DuraPrep and site prepped and draped Patient monitoring: continuous pulse ox and blood pressure Approach: midline Location: thoracic (1-12) Injection technique: LOR air  Needle:  Needle type: Tuohy  Needle gauge: 17 G Needle length: 9 cm and 9 Needle insertion depth: 6 cm Catheter type: closed end flexible Catheter size: 19 Gauge Catheter at skin depth: 14 cm Test dose: negative  Assessment Events: blood not aspirated, no cerebrospinal fluid, injection not painful, no injection resistance, no paresthesia and negative IV test

## 2022-08-19 NOTE — Progress Notes (Signed)
1 Day Post-Op  Subjective: No acute issues overnight. Endorses pain but feels PCA is helping. No nausea or vomiting.   Objective: Vital signs in last 24 hours: Temp:  [97.6 F (36.4 C)-98.4 F (36.9 C)] 98.2 F (36.8 C) (06/13 0308) Pulse Rate:  [69-98] 90 (06/13 0308) Resp:  [12-21] 13 (06/13 0311) BP: (107-160)/(47-80) 107/62 (06/13 0308) SpO2:  [95 %-100 %] 100 % (06/13 0311) Arterial Line BP: (171-192)/(65-71) 192/71 (06/12 1715) FiO2 (%):  [33 %-35 %] 34 % (06/13 0311) Weight:  [65.8 kg] 65.8 kg (06/12 0916) Last BM Date : 08/18/22  Intake/Output from previous day: 06/12 0701 - 06/13 0700 In: 3842.8 [I.V.:2922.7; IV Piggyback:900] Out: 2585 [Urine:1765; Drains:320; Blood:500] Intake/Output this shift: No intake/output data recorded.  PE: General: resting comfortably, NAD Neuro: alert and oriented, no focal deficits HEENT: NG in place draining gastric contents, nonbilious Resp: normal work of breathing CV: RRR Abdomen: soft, nondistended, appropriately tender. Upper midline incision is clean and dry with no erythema or induration. LUQ JP serosanguinous. Extremities: warm and well-perfused GU: foley draining clear light yellow urine   Lab Results:  Recent Labs    08/18/22 1143 08/18/22 1513  WBC  --  29.0*  HGB 11.9* 10.6*  HCT 35.0* 32.2*  PLT  --  254   BMET Recent Labs    08/18/22 1143 08/18/22 1513  NA 140 137  K 3.5 4.1  CL  --  102  CO2  --  23  GLUCOSE  --  221*  BUN  --  10  CREATININE  --  0.67  CALCIUM  --  7.8*   PT/INR No results for input(s): "LABPROT", "INR" in the last 72 hours. CMP     Component Value Date/Time   NA 137 08/18/2022 1513   NA 139 06/30/2014 1750   K 4.1 08/18/2022 1513   K 3.3 (L) 06/30/2014 1750   CL 102 08/18/2022 1513   CL 105 06/30/2014 1750   CO2 23 08/18/2022 1513   CO2 20 (L) 06/30/2014 1750   GLUCOSE 221 (H) 08/18/2022 1513   GLUCOSE 115 (H) 06/30/2014 1750   BUN 10 08/18/2022 1513   BUN 10  06/30/2014 1750   CREATININE 0.67 08/18/2022 1513   CREATININE 0.78 07/16/2022 1101   CREATININE 0.85 06/30/2014 1750   CALCIUM 7.8 (L) 08/18/2022 1513   CALCIUM 8.9 06/30/2014 1750   PROT 7.9 07/16/2022 1101   ALBUMIN 4.4 07/16/2022 1101   AST 17 07/16/2022 1101   ALT 15 07/16/2022 1101   ALKPHOS 57 07/16/2022 1101   BILITOT 0.5 07/16/2022 1101   GFRNONAA >60 08/18/2022 1513   GFRNONAA >60 07/16/2022 1101   GFRNONAA >60 06/30/2014 1750   GFRAA >60 04/13/2018 1722   GFRAA >60 06/30/2014 1750   Lipase     Component Value Date/Time   LIPASE 26 07/02/2022 1503       Studies/Results: No results found.    Assessment/Plan 62 yo male with adenocarcinoma of the tail of the pancreas, POD1 s/p distal pancreatectomy with splenectomy. - Remove NG, advance to clear liquid diet - Maintenance IV fluids at 75 ml/hr - Remove foley - Pain control: Continue epidural and PCA. Resume home MS Contin q8h, use PCA for breakthrough pain. Scheduled tylenol and robaxin. 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 - Sliding scale insulin q4h - Mobilize, PT ordered - Labs pending today - Send drain amylase today and POD3 - VTE: SQH, SCDs - Dispo:  progressive care     LOS: 1 day    Sophronia Simas, MD [facility] Surgery General, Hepatobiliary and Pancreatic Surgery 08/19/22 8:01 AM

## 2022-08-19 NOTE — Addendum Note (Signed)
Addendum  created 08/19/22 0836 by Bethena Midget, MD   Child order released for a procedure order, Clinical Note Signed, Intraprocedure Blocks edited, LDA created via procedure documentation, SmartForm saved

## 2022-08-19 NOTE — Plan of Care (Signed)

## 2022-08-19 NOTE — Inpatient Diabetes Management (Signed)
Inpatient Diabetes Program Recommendations  AACE/ADA: New Consensus Statement on Inpatient Glycemic Control (2015)  Target Ranges:  Prepandial:   less than 140 mg/dL      Peak postprandial:   less than 180 mg/dL (1-2 hours)      Critically ill patients:  140 - 180 mg/dL   Lab Results  Component Value Date   GLUCAP 145 (H) 08/19/2022   HGBA1C 6.5 (H) 08/19/2022    Review of Glycemic Control  Latest Reference Range & Units 08/18/22 19:42 08/18/22 23:13 08/19/22 03:10 08/19/22 08:03  Glucose-Capillary 70 - 99 mg/dL 409 (H) 811 (H) 914 (H) 145 (H)   Diabetes history: None- adenocarcinoma of the tail of the pancreas, POD1 s/p distal pancreatectomy with splenectomy Outpatient Diabetes medications:  None Current orders for Inpatient glycemic control:  Novolog 0-9 units q 4 hours  Inpatient Diabetes Program Recommendations:    Note patient did receive Decadron pre-op yesterday which could have increased blood sugars.  Will follow.  May benefit from Continuous glucose sensor for monitoring at discharge.   Thanks,  Beryl Meager, RN, BC-ADM Inpatient Diabetes Coordinator Pager [phone]  (8a-5p)

## 2022-08-19 NOTE — Evaluation (Signed)
Physical Therapy Evaluation Patient Details Name: David Harrell MRN: [id number] DOB: 1960-09-25 Today's Date: 08/19/2022  History of Present Illness  Patient is a 62 y/o male admitted 08/18/22 for open distal pancreatectomy with splenectomy and liver biopsy due to pancreatic adenocarcinoma.  PMH positive for tobacco use, multiple back surgeries, GERD, HTN and neck surgery.  Clinical Impression  Patient presents with decreased mobility due to pain, limited activity tolerance decreased balance and some LE residual numbness from epidural.  Currently min A for up to take steps with RW to recliner today with limited ambulation.  Patient previously independent with cane.  Feel he will benefit from skilled PT in the acute setting may need HHPT at d/c.       Recommendations for follow up therapy are one component of a multi-disciplinary discharge planning process, led by the attending physician.  Recommendations may be updated based on patient status, additional functional criteria and insurance authorization.  Follow Up Recommendations       Assistance Recommended at Discharge Intermittent Supervision/Assistance  Patient can return home with the following  A little help with walking and/or transfers;A lot of help with bathing/dressing/bathroom;Assistance with cooking/housework;Assist for transportation;Help with stairs or ramp for entrance    Equipment Recommendations BSC/3in1 (versus tub seat)  Recommendations for Other Services       Functional Status Assessment Patient has had a recent decline in their functional status and demonstrates the ability to make significant improvements in function in a reasonable and predictable amount of time.     Precautions / Restrictions        Mobility  Bed Mobility Overal bed mobility: Needs Assistance Bed Mobility: Rolling, Sidelying to Sit Rolling: Min assist Sidelying to sit: Mod assist            Transfers Overall transfer level: Needs  assistance Equipment used: Rolling walker (2 wheels) Transfers: Sit to/from Stand, Bed to chair/wheelchair/BSC Sit to Stand: Min assist, From elevated surface   Step pivot transfers: Min assist       General transfer comment: cues for technique; assist for lines and to steady stepping to chair    Ambulation/Gait               General Gait Details: deferred due to L LE numbness from epidural  Stairs            Wheelchair Mobility    Modified Rankin (Stroke Patients Only)       Balance Overall balance assessment: Needs assistance   Sitting balance-Leahy Scale: Fair     Standing balance support: Bilateral upper extremity supported, Reliant on assistive device for balance Standing balance-Leahy Scale: Poor                               Pertinent Vitals/Pain Pain Assessment Pain Assessment: 0-10 Pain Score: 7  Pain Location: abdomen Pain Descriptors / Indicators: Discomfort, Operative site guarding Pain Intervention(s): Monitored during session, PCA encouraged    Home Living Family/patient expects to be discharged to:: Private residence Living Arrangements: Spouse/significant other Available Help at Discharge: Family Type of Home: House Home Access: Stairs to enter Entrance Stairs-Rails: Can reach both Entrance Stairs-Number of Steps: 2   Home Layout: One level Home Equipment: Grab bars - tub/shower;Rolling Walker (2 wheels)      Prior Function Prior Level of Function : Independent/Modified Independent             Mobility Comments: on disability since  2001       Hand Dominance   Dominant Hand: Right    Extremity/Trunk Assessment   Upper Extremity Assessment Upper Extremity Assessment: Generalized weakness    Lower Extremity Assessment Lower Extremity Assessment: Generalized weakness    Cervical / Trunk Assessment Cervical / Trunk Assessment: Kyphotic;Other exceptions  Communication   Communication: No difficulties   Cognition Arousal/Alertness: Awake/alert Behavior During Therapy: WFL for tasks assessed/performed Overall Cognitive Status: Within Functional Limits for tasks assessed                                          General Comments General comments (skin integrity, edema, etc.): using PCA for pain throughout; VSS on RA    Exercises     Assessment/Plan    PT Assessment Patient needs continued PT services  PT Problem List Decreased strength;Decreased balance;Decreased mobility;Decreased range of motion;Decreased activity tolerance;Pain;Decreased knowledge of precautions;Decreased knowledge of use of DME       PT Treatment Interventions DME instruction;Functional mobility training;Balance training;Patient/family education;Therapeutic activities;Gait training;Therapeutic exercise;Stair training    PT Goals (Current goals can be found in the Care Plan section)  Acute Rehab PT Goals Patient Stated Goal: to return home with family support PT Goal Formulation: With patient Time For Goal Achievement: 09/02/22 Potential to Achieve Goals: Good    Frequency Min 3X/week     Co-evaluation               AM-PAC PT "6 Clicks" Mobility  Outcome Measure Help needed turning from your back to your side while in a flat bed without using bedrails?: A Little Help needed moving from lying on your back to sitting on the side of a flat bed without using bedrails?: A Lot Help needed moving to and from a bed to a chair (including a wheelchair)?: A Little Help needed standing up from a chair using your arms (e.g., wheelchair or bedside chair)?: A Little Help needed to walk in hospital room?: Total Help needed climbing 3-5 steps with a railing? : Total 6 Click Score: 13    End of Session   Activity Tolerance: Treatment limited secondary to medical complications (Comment) (L LE numbness) Patient left: in chair;with call bell/phone within reach;with chair alarm set Nurse  Communication: Mobility status PT Visit Diagnosis: Other abnormalities of gait and mobility (R26.89);Muscle weakness (generalized) (M62.81)    Time: 2725-3664 PT Time Calculation (min) (ACUTE ONLY): 39 min   Charges:   PT Evaluation $PT Eval Moderate Complexity: 1 Mod PT Treatments $Therapeutic Activity: 23-37 mins        Sheran Lawless, PT Acute Rehabilitation Services [phone] 08/19/2022   Elray Mcgregor 08/19/2022, 1:42 PM

## 2022-08-19 NOTE — Anesthesia Post-op Follow-up Note (Signed)
  Anesthesia Pain Follow-up Note  [name] [facility]  Day #: 1  Date of Follow-up: 08/19/2022 Time: 1:01 PM  Last Vitals:  Vitals:   08/19/22 1009 08/19/22 1138  BP:  (!) 141/93  Pulse: 87 76  Resp: 14 (!) 21  Temp:  36.7 C  SpO2: 99% 98%    Level of Consciousness: alert  Pain: 2 /10   Side Effects:None  Catheter Site Exam:clean, dry, no drainage  Anti-Coag Meds (From admission, onward)    Start     Dose/Rate Route Frequency Ordered Stop   08/19/22 0800  heparin injection 5,000 Units        5,000 Units Subcutaneous Every 8 hours 08/18/22 1803        Epidural / Intrathecal (From admission, onward)    Start     Dose/Rate Route Frequency Ordered Stop   08/18/22 1045  ropivacaine (PF) 2 mg/mL (0.2%) (NAROPIN) injection        5 mL/hr 5 mL/hr  Epidural Continuous 08/18/22 1041          Plan: Continue current therapy of postop epidural at surgeon's request Pain 8/10 when seeing patient on floor, some blood at dressing site. Epidural redressed, flushed and bolused with 0.2% ropiv with some increased pain relief. Rate increased from 8 to 36ml/h  Lannie Fields

## 2022-08-19 NOTE — Progress Notes (Addendum)
Initial Nutrition Assessment  DOCUMENTATION CODES:   Severe malnutrition in context of chronic illness  INTERVENTION:   - Boost Breeze po TID, each supplement provides 250 kcal and 9 grams of protein  - RD will monitor for diet advancement and change oral nutrition supplement to Ensure Plus High Protein once diet advanced to full liquids  NUTRITION DIAGNOSIS:   Severe Malnutrition related to chronic illness (pancreatic adenocarcinoma) as evidenced by moderate fat depletion, severe muscle depletion, percent weight loss (17.1% weight loss in 6 months).  GOAL:   Patient will meet greater than or equal to 90% of their needs  MONITOR:   PO intake, Supplement acceptance, Diet advancement, Weight trends, Labs, I & O's  REASON FOR ASSESSMENT:   Consult Assessment of nutrition requirement/status  ASSESSMENT:   62 year old male who presented on 6/12 for surgery with Dr. Freida Busman. PMH of pancreatic adenocarcinoma of the pancreatic tail, tobacco abuse, HTN, hiatal hernia, pancreatitis, GERD.  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 At that time, pt reported decreased appetite and abdominal pain with and without eating. Pt had not been consuming oral nutrition supplements prior to RD appointment due to inability to afford them.  Spoke with pt at bedside. He confirms the information above and states that he experienced almost constant abdominal pain that usually began as soon as he started eating and would persist after a meal was over. This caused decreased appetite and significantly decreased PO intake. Pt reports eating twice a day and having bites of chicken, bites of pork chop, sips of broth, etc. He confirms that prior to outpatient RD appointment, he was not drinking oral nutrition supplements. Pt was provided with a  complimentary case of Ensure Plus High Protein at that visit and has since been consuming 4 of these daily.  Pt reports 2 hospital admissions for pancreatitis. over the last 6 months. Pt experienced N/V with both episodes. Pt reports slight nausea now but manageable. Pt denies vomiting. RD encouraged pt to reach out quickly if nausea worsens/persists so that he can receive PRN anti-nausea medications.  Pt with clear liquid meal tray in room at time of visit. Pt had consumed most of the beverages on the clear liquid tray but had not eaten the jello. Pt willing to drink Boost Breeze while on a clear liquid diet.  Pt endorses significant weight loss. He reports a UBW of 175 lbs and states that he last weighed this about 6 months ago in January 2024. He thinks he weighs about 141 lbs now. Current weight documented as 145 lbs. Pt with a 30 lb (17.1%) weight loss in 6 months which is severe and significant for timeframe. Based on weight loss and NFPE, pt meets criteria for severe malnutrition.  Medications reviewed and include: SSI q 4 hours, IV protonix IVF: LR @ 75 ml/hr  Labs from 6/12 reviewed: ionized calcium 1.14, WBC 29.0 CBG's: 145-226 x 24 hours  UOP: 1265 ml x 24 hours LLQ JP drain: 320 ml x 24 hours I/O's: +992 ml since admit  NUTRITION - FOCUSED PHYSICAL EXAM:  Flowsheet Row Most Recent Value  Orbital Region Moderate depletion  Upper Arm Region Mild depletion  Thoracic and Lumbar Region Mild depletion  Buccal Region Moderate depletion  Temple Region Moderate depletion  Clavicle Bone Region Severe depletion  Clavicle and Acromion Bone Region Severe  depletion  Scapular Bone Region Moderate depletion  Dorsal Hand Moderate depletion  Patellar Region Moderate depletion  Anterior Thigh Region Severe depletion  Posterior Calf Region Moderate depletion  Edema (RD Assessment) None  Hair Reviewed  Eyes Reviewed  Mouth Reviewed  Skin Reviewed  Nails Reviewed       Diet Order:    Diet Order             Diet clear liquid Room service appropriate? Yes; Fluid consistency: Thin  Diet effective now                   EDUCATION NEEDS:   Education needs have been addressed  Skin:  Skin Assessment: Reviewed RN Assessment (closed abd incision)  Last BM:  08/18/22  Height:   Ht Readings from Last 1 Encounters:  08/18/22 5\' 8"  (1.727 m)    Weight:   Wt Readings from Last 1 Encounters:  08/18/22 65.8 kg    BMI:  Body mass index is 22.05 kg/m.  Estimated Nutritional Needs:   Kcal:  2000-2200  Protein:  95-110 grams  Fluid:  >1.8 L    [name], MS, RD, LDN Inpatient Clinical Dietitian Please see AMiON for contact information.

## 2022-08-20 LAB — GLUCOSE, CAPILLARY
Glucose-Capillary: 121 mg/dL — ABNORMAL HIGH (ref 70–99)
Glucose-Capillary: 134 mg/dL — ABNORMAL HIGH (ref 70–99)
Glucose-Capillary: 136 mg/dL — ABNORMAL HIGH (ref 70–99)
Glucose-Capillary: 136 mg/dL — ABNORMAL HIGH (ref 70–99)
Glucose-Capillary: 138 mg/dL — ABNORMAL HIGH (ref 70–99)
Glucose-Capillary: 94 mg/dL (ref 70–99)

## 2022-08-20 LAB — BASIC METABOLIC PANEL
Anion gap: 10 (ref 5–15)
BUN: 6 mg/dL (ref 6–20)
CO2: 22 mmol/L (ref 22–32)
Calcium: 7.8 mg/dL — ABNORMAL LOW (ref 8.9–10.3)
Chloride: 102 mmol/L (ref 98–111)
Creatinine, Ser: 0.67 mg/dL (ref 0.61–1.24)
GFR, Estimated: >60 mL/min (ref >60)
Glucose, Bld: 84 mg/dL (ref 70–99)
Potassium: 4 mmol/L (ref 3.5–5.1)
Sodium: 134 mmol/L — ABNORMAL LOW (ref 135–145)

## 2022-08-20 LAB — CBC
HCT: 30.8 % — ABNORMAL LOW (ref 39.0–52.0)
Hemoglobin: 9.7 g/dL — ABNORMAL LOW (ref 13.0–17.0)
MCH: 29.4 pg (ref 26.0–34.0)
MCHC: 31.5 g/dL (ref 30.0–36.0)
MCV: 93.3 fL (ref 80.0–100.0)
Platelets: 243 10*3/uL (ref 150–400)
RBC: 3.3 MIL/uL — ABNORMAL LOW (ref 4.22–5.81)
RDW: 13.6 % (ref 11.5–15.5)
WBC: 22.8 10*3/uL — ABNORMAL HIGH (ref 4.0–10.5)
nRBC: 0 % (ref 0.0–0.2)

## 2022-08-20 MED ORDER — ENSURE ENLIVE PO LIQD
237.0000 mL | Freq: Four times a day (QID) | ORAL | Status: DC
  Administered 2022-08-20 – 2022-08-24 (×14): 237 mL via ORAL

## 2022-08-20 MED ORDER — ACETAMINOPHEN 500 MG PO TABS
1000.0000 mg | ORAL_TABLET | Freq: Three times a day (TID) | ORAL | Status: DC
  Administered 2022-08-20 – 2022-08-24 (×13): 1000 mg via ORAL
  Filled 2022-08-20 (×13): qty 2

## 2022-08-20 MED ORDER — PANTOPRAZOLE SODIUM 40 MG PO TBEC
40.0000 mg | DELAYED_RELEASE_TABLET | Freq: Every day | ORAL | Status: DC
  Administered 2022-08-20 – 2022-08-24 (×5): 40 mg via ORAL
  Filled 2022-08-20 (×5): qty 1

## 2022-08-20 MED ORDER — ALPRAZOLAM 0.5 MG PO TABS
1.0000 mg | ORAL_TABLET | Freq: Two times a day (BID) | ORAL | Status: DC | PRN
  Administered 2022-08-20 – 2022-08-24 (×7): 1 mg via ORAL
  Filled 2022-08-20 (×8): qty 2

## 2022-08-20 NOTE — Progress Notes (Signed)
Physical Therapy Treatment Patient Details Name: David Harrell MRN: [id number] DOB: [birth year] Today's Date: 08/20/2022   History of Present Illness Patient is a 62 y/o male admitted 08/18/22 for open distal pancreatectomy with splenectomy and liver biopsy due to pancreatic adenocarcinoma.  PMH positive for tobacco use, multiple back surgeries, GERD, HTN and neck surgery.    PT Comments    Pt received in supine, c/o fatigue and wanting to take a nap but agreeable to therapy session with emphasis on transfer training and supine/seated BLE exercises. Pt pulling ~1500 on IS at most. Pt reluctant to sit up in recliner but agreeable to try "for a bit" with encouragement from PTA and education on benefits of upright posture/risks of immobility in bed post-op. Pt continues to benefit from PT services to progress toward functional mobility goals, pt making slow progress toward goals and may need to reconsider dispoistion pending progress with standing/gait next 1-2 sessions.    Recommendations for follow up therapy are one component of a multi-disciplinary discharge planning process, led by the attending physician.  Recommendations may be updated based on patient status, additional functional criteria and insurance authorization.  Follow Up Recommendations       Assistance Recommended at Discharge Intermittent Supervision/Assistance  Patient can return home with the following A little help with walking and/or transfers;A lot of help with bathing/dressing/bathroom;Assistance with cooking/housework;Assist for transportation;Help with stairs or ramp for entrance   Equipment Recommendations       Recommendations for Other Services       Precautions / Restrictions Precautions Precautions: Fall;Other (comment) Precaution Comments: abd protection precs, JP drain Restrictions Weight Bearing Restrictions: No     Mobility  Bed Mobility Overal bed mobility: Needs Assistance Bed Mobility: Rolling,  Sidelying to Sit Rolling: Supervision Sidelying to sit: Min guard       General bed mobility comments: pt rolling to his L side, pt requesting PTA keep bed side rail up while he performs log roll to this side and once in long sit in bed with feet pointing to side, bed rail lowered and pt able to scoot further to EOB and rotate to foot flat with min guard for safety.    Transfers Overall transfer level: Needs assistance Equipment used: Rolling walker (2 wheels) Transfers: Sit to/from Stand, Bed to chair/wheelchair/BSC Sit to Stand: Min assist, From elevated surface   Step pivot transfers: Min assist       General transfer comment: cues for technique; assist for lines and to steady stepping to chair; pt reluctant but performed with max encouragement; RW for stability and no overt buckling, however pt states he did buckle in AM when nursing staff assisted him to stand during hygiene task.    Ambulation/Gait               General Gait Details: pt defers due to pain and c/o LLE fatigue after pivot transfer.   Stairs             Wheelchair Mobility    Modified Rankin (Stroke Patients Only)       Balance Overall balance assessment: Needs assistance Sitting-balance support: No upper extremity supported Sitting balance-Leahy Scale: Fair     Standing balance support: Bilateral upper extremity supported, Reliant on assistive device for balance Standing balance-Leahy Scale: Poor Standing balance comment: pt w/o overt buckling but reports feeling unstable; used RW throughout  Cognition Arousal/Alertness: Awake/alert Behavior During Therapy: Flat affect Overall Cognitive Status: No family/caregiver present to determine baseline cognitive functioning Area of Impairment: Memory                     Memory: Decreased short-term memory         General Comments: Pt states "I didn't use the PCA pump yet" but had used it  with PTA witnessing.        Exercises Other Exercises Other Exercises: Reviewed supine/seated BLE AROM: ankle pumps, heel slides, LAQ x5-10 reps ea Other Exercises: IS x 10 reps (pt achieves [phone] mL) with cues for improved technique, encouraged hourly use Other Exercises: pt defers standing exercise or further HEP in chair due to pain/fatigue    General Comments General comments (skin integrity, edema, etc.): HR 87 bpm sitting EOB and SpO2 99-100% on RA and 2L O2 [place] (on RA ~2 mins during transfer and remained 97-99% on RA, but placed back on 2L wall O2 once pt up in chair, RN notified.      Pertinent Vitals/Pain Pain Assessment Pain Assessment: 0-10 Pain Score: 5  Pain Location: abdomen and back with movement; 3/10 resting Pain Descriptors / Indicators: Discomfort, Operative site guarding, Grimacing Pain Intervention(s): Limited activity within patient's tolerance, Monitored during session, Repositioned, Premedicated before session, PCA encouraged, Other (comment) (pt used PCA prior to step pivot transfer to chair)           PT Goals (current goals can now be found in the care plan section) Acute Rehab PT Goals Patient Stated Goal: to return home with family support PT Goal Formulation: With patient Time For Goal Achievement: 09/02/22 Progress towards PT goals: Progressing toward goals (slowly)    Frequency    Min 3X/week      PT Plan Current plan remains appropriate       AM-PAC PT "6 Clicks" Mobility   Outcome Measure  Help needed turning from your back to your side while in a flat bed without using bedrails?: A Little Help needed moving from lying on your back to sitting on the side of a flat bed without using bedrails?: A Lot (flat bed with no rail) Help needed moving to and from a bed to a chair (including a wheelchair)?: A Little Help needed standing up from a chair using your arms (e.g., wheelchair or bedside chair)?: A Little Help needed to walk in  hospital room?: Total Help needed climbing 3-5 steps with a railing? : Total 6 Click Score: 13    End of Session Equipment Utilized During Treatment: Gait belt;Oxygen Activity Tolerance: Patient limited by pain;Patient limited by fatigue;Treatment limited secondary to medical complications (Comment);Other (comment) (LLE numbness) Patient left: in chair;with call bell/phone within reach;with chair alarm set Nurse Communication: Mobility status;Other (comment) (PTA encouraged him to sit up in chair at least 1 hour) PT Visit Diagnosis: Other abnormalities of gait and mobility (R26.89);Muscle weakness (generalized) (M62.81)     Time: 2440-1027 PT Time Calculation (min) (ACUTE ONLY): 34 min  Charges:  $Therapeutic Exercise: 8-22 mins $Therapeutic Activity: 8-22 mins                     Revere Maahs P., PTA Acute Rehabilitation Services Secure Chat Preferred 9a-5:30pm Office: [phone]    Dorathy Kinsman [facility] 08/20/2022, 3:16 PM

## 2022-08-20 NOTE — TOC CM/SW Note (Signed)
Transition of Care [facility] - Inpatient Brief Assessment   Patient Details  Name: David Harrell MRN: [id number] Date of Birth: 1960-04-24  Transition of [facility] CM/SW Contact:    Darrold Span, RN Phone Number: 08/20/2022, 2:29 PM   Clinical Narrative: Pt s/p distal pancreatectomy with splenectomy. Still on PCA, advancing diet. We will continue to monitor patient advancement through interdisciplinary progression rounds. If new patient transition needs arise, please place a TOC consult.   Transition of Care Asessment: Insurance and Status: Insurance coverage has been reviewed Patient has primary care physician: Yes Home environment has been reviewed: home Prior level of function:: independent Prior/Current Home Services: No current home services Social Determinants of Health Reivew: SDOH reviewed no interventions necessary Readmission risk has been reviewed: Yes Transition of care needs: no transition of care needs at this time

## 2022-08-20 NOTE — Progress Notes (Signed)
    2 Days Post-Op  Subjective: Epidural paused this morning due to air in line. Patient reports numbness in left foot, able to move foot. Worked with PT yesterday, ambulated in room. Tolerating clear liquids, occasional nausea but no vomiting.   Objective: Vital signs in last 24 hours: Temp:  [98.1 F (36.7 C)-99.4 F (37.4 C)] 98.7 F (37.1 C) (06/14 0329) Pulse Rate:  [75-87] 87 (06/14 0329) Resp:  [8-21] 14 (06/14 0329) BP: (108-141)/(61-93) 126/76 (06/14 0329) SpO2:  [95 %-100 %] 100 % (06/14 0329) FiO2 (%):  [33 %-34 %] 34 % (06/14 0326) Last BM Date : 08/18/22  Intake/Output from previous day: 06/13 0701 - 06/14 0700 In: 637.4 [P.O.:360; I.V.:260.7] Out: 3605 [Urine:3315; Emesis/NG output:150; Drains:140] Intake/Output this shift: No intake/output data recorded.  PE: General: resting comfortably, NAD Neuro: alert and oriented, no focal deficits Resp: normal work of breathing CV: RRR Abdomen: soft, nondistended, appropriately tender. Upper midline incision is clean and dry with no erythema or induration. LUQ JP serosanguinous. Extremities: warm and well-perfused   Lab Results:  Recent Labs    08/19/22 0905 08/20/22 0332  WBC 23.8* 22.8*  HGB 10.1* 9.7*  HCT 30.8* 30.8*  PLT 277 243   BMET Recent Labs    08/19/22 0905 08/20/22 0332  NA 138 134*  K 3.8 4.0  CL 105 102  CO2 24 22  GLUCOSE 155* 84  BUN 7 6  CREATININE 0.66 0.67  CALCIUM 8.2* 7.8*   PT/INR No results for input(s): "LABPROT", "INR" in the last 72 hours. CMP     Component Value Date/Time   NA 134 (L) 08/20/2022 0332   NA 139 06/30/2014 1750   K 4.0 08/20/2022 0332   K 3.3 (L) 06/30/2014 1750   CL 102 08/20/2022 0332   CL 105 06/30/2014 1750   CO2 22 08/20/2022 0332   CO2 20 (L) 06/30/2014 1750   GLUCOSE 84 08/20/2022 0332   GLUCOSE 115 (H) 06/30/2014 1750   BUN 6 08/20/2022 0332   BUN 10 06/30/2014 1750   CREATININE 0.67 08/20/2022 0332   CREATININE 0.78 07/16/2022 1101    CREATININE 0.85 06/30/2014 1750   CALCIUM 7.8 (L) 08/20/2022 0332   CALCIUM 8.9 06/30/2014 1750   PROT 7.9 07/16/2022 1101   ALBUMIN 4.4 07/16/2022 1101   AST 17 07/16/2022 1101   ALT 15 07/16/2022 1101   ALKPHOS 57 07/16/2022 1101   BILITOT 0.5 07/16/2022 1101   GFRNONAA >60 08/20/2022 0332   GFRNONAA >60 07/16/2022 1101   GFRNONAA >60 06/30/2014 1750   GFRAA >60 04/13/2018 1722   GFRAA >60 06/30/2014 1750   Lipase     Component Value Date/Time   LIPASE 26 07/02/2022 1503       Studies/Results: No results found.    Assessment/Plan 62 yo male with adenocarcinoma of the tail of the pancreas, POD2 s/p distal pancreatectomy with splenectomy. - Advance to full liquid diet - Maintenance IV fluids at 50 ml/hr - Remove foley - Pain control: Continue epidural and PCA. Home MS contin, scheduled tylenol and robaxin. Suspect left leg numbness is secondary to epidural, awaiting anesthesia eval this morning. - Sliding scale insulin q4h. Hgb A1c 6.5. - Mobilize, PT ordered - POD1 drain amylase pending. - VTE: SQH, SCDs - Dispo: progressive care     LOS: 2 days    Sophronia Simas, MD [facility] Surgery General, Hepatobiliary and Pancreatic Surgery 08/20/22 7:42 AM

## 2022-08-20 NOTE — Progress Notes (Signed)
Brief Nutrition Note  Pt's diet advanced to full liquids today. Spoke with pt via phone call to room. Pt reports nausea is improved today. He states that he did drink 1 Boost Breeze yesterday and tolerated it well. He is on board with switching supplement to Ensure Plus High Protein now that diet has been advanced.  RD will adjust supplement regimen: - d/c Boost Breeze - Ensure Enlive po QID, each supplement provides 350 kcal and 20 grams of protein  RD will continue to follow pt during admission.   [name], MS, RD, LDN Inpatient Clinical Dietitian Please see AMiON for contact information.

## 2022-08-20 NOTE — Care Management Important Message (Signed)
Important Message  Patient Details  Name: ANCHOR HUISINGA MRN: [id number] Date of Birth: 1960/06/08   Medicare Important Message Given:  Yes     Sherilyn Banker 08/20/2022, 4:27 PM

## 2022-08-20 NOTE — Progress Notes (Signed)
Changed Dilaudid cartridge in PCA pump with Celene Squibb, RN (charge nurse).  Wasted 3cc of dilaudid with charge from the last cartridge.

## 2022-08-20 NOTE — Inpatient Diabetes Management (Signed)
Inpatient Diabetes Program Recommendations  AACE/ADA: New Consensus Statement on Inpatient Glycemic Control (2015)  Target Ranges:  Prepandial:   less than 140 mg/dL      Peak postprandial:   less than 180 mg/dL (1-2 hours)      Critically ill patients:  140 - 180 mg/dL   Lab Results  Component Value Date   GLUCAP 138 (H) 08/20/2022   HGBA1C 6.5 (H) 08/19/2022    Review of Glycemic Control  Latest Reference Range & Units 08/19/22 11:39 08/19/22 16:03 08/19/22 19:55 08/19/22 23:41 08/20/22 03:30 08/20/22 07:45 08/20/22 11:43  Glucose-Capillary 70 - 99 mg/dL 161 (H) 096 (H) 045 (H) 150 (H) 94 136 (H) 138 (H)   Outpatient Diabetes medications: None Current orders for Inpatient glycemic control:  Novolog 0-9 units q 4 hours  Inpatient Diabetes Program Recommendations:    Note A1C 6.5% (A1C in April 2024 6.2%).  Blood sugars are improving.  Talked briefly with patient regarding potential need for monitoring of her blood sugars when he goes home.  He states that MD told him yesterday regarding elevated A1C.  Will follow.  Needs close follow up with PCP regarding blood sugars, A1C and monitoring.    Thanks,  Beryl Meager, RN, BC-ADM Inpatient Diabetes Coordinator Pager [phone]  (8a-5p)

## 2022-08-20 NOTE — Anesthesia Post-op Follow-up Note (Signed)
  Anesthesia Pain Follow-up Note  Patient: Billyjo Hidalgo [facility]  Day #: 5  Date of Follow-up: 08/20/2022 Time: 12:53 PM  Last Vitals:  Vitals:   08/20/22 1146 08/20/22 1225  BP: 133/81   Pulse: 89   Resp: 16 14  Temp: 36.8 C   SpO2: 100% 99%    Level of Consciousness: alert  Pain: 5 /10   Side Effects:none  Catheter Site Exam:clean, no drainage  Anti-Coag Meds (From admission, onward)    Start     Dose/Rate Route Frequency Ordered Stop   08/19/22 0800  heparin injection 5,000 Units        5,000 Units Subcutaneous Every 8 hours 08/18/22 1803        Epidural / Intrathecal (From admission, onward)    Start     Dose/Rate Route Frequency Ordered Stop   08/19/22 1430  ropivacaine (PF) 2 mg/mL (0.2%) (NAROPIN) injection        10 mL/hr 10 mL/hr  Epidural Continuous 08/19/22 1338          Plan: Continue current therapy of postop epidural at surgeon's request  Malekai Markwood

## 2022-08-21 LAB — CBC
HCT: 28.6 % — ABNORMAL LOW (ref 39.0–52.0)
Hemoglobin: 9.5 g/dL — ABNORMAL LOW (ref 13.0–17.0)
MCH: 28.9 pg (ref 26.0–34.0)
MCHC: 33.2 g/dL (ref 30.0–36.0)
MCV: 86.9 fL (ref 80.0–100.0)
Platelets: 275 10*3/uL (ref 150–400)
RBC: 3.29 MIL/uL — ABNORMAL LOW (ref 4.22–5.81)
RDW: 13.4 % (ref 11.5–15.5)
WBC: 17.8 10*3/uL — ABNORMAL HIGH (ref 4.0–10.5)
nRBC: 0 % (ref 0.0–0.2)

## 2022-08-21 LAB — BASIC METABOLIC PANEL
Anion gap: 9 (ref 5–15)
BUN: 5 mg/dL — ABNORMAL LOW (ref 6–20)
CO2: 28 mmol/L (ref 22–32)
Calcium: 8.2 mg/dL — ABNORMAL LOW (ref 8.9–10.3)
Chloride: 99 mmol/L (ref 98–111)
Creatinine, Ser: 0.57 mg/dL — ABNORMAL LOW (ref 0.61–1.24)
GFR, Estimated: >60 mL/min (ref >60)
Glucose, Bld: 117 mg/dL — ABNORMAL HIGH (ref 70–99)
Potassium: 3.6 mmol/L (ref 3.5–5.1)
Sodium: 136 mmol/L (ref 135–145)

## 2022-08-21 LAB — GLUCOSE, CAPILLARY
Glucose-Capillary: 110 mg/dL — ABNORMAL HIGH (ref 70–99)
Glucose-Capillary: 114 mg/dL — ABNORMAL HIGH (ref 70–99)
Glucose-Capillary: 140 mg/dL — ABNORMAL HIGH (ref 70–99)
Glucose-Capillary: 100 mg/dL — ABNORMAL HIGH (ref 70–99)
Glucose-Capillary: 113 mg/dL — ABNORMAL HIGH (ref 70–99)
Glucose-Capillary: 193 mg/dL — ABNORMAL HIGH (ref 70–99)
Glucose-Capillary: 97 mg/dL (ref 70–99)

## 2022-08-21 MED ORDER — HYDROMORPHONE HCL 1 MG/ML IJ SOLN
1.0000 mg | INTRAMUSCULAR | Status: DC | PRN
  Administered 2022-08-21 – 2022-08-23 (×9): 1 mg via INTRAVENOUS
  Filled 2022-08-21 (×9): qty 1

## 2022-08-21 MED ORDER — DOCUSATE SODIUM 100 MG PO CAPS
100.0000 mg | ORAL_CAPSULE | Freq: Two times a day (BID) | ORAL | Status: DC
  Administered 2022-08-21 – 2022-08-24 (×7): 100 mg via ORAL
  Filled 2022-08-21 (×7): qty 1

## 2022-08-21 MED ORDER — OXYCODONE HCL 5 MG PO TABS
10.0000 mg | ORAL_TABLET | ORAL | Status: DC | PRN
  Administered 2022-08-21 – 2022-08-24 (×9): 15 mg via ORAL
  Filled 2022-08-21 (×9): qty 3

## 2022-08-21 NOTE — Anesthesia Post-op Follow-up Note (Signed)
  Anesthesia Pain Follow-up Note  [name] [facility]  Day #: 6  Date of Follow-up: 08/21/2022 Time: 12:55 PM  Last Vitals:  Vitals:   08/21/22 0705 08/21/22 0800  BP: 132/79 126/87  Pulse: 79 74  Resp: 17 10  Temp: 36.7 C 36.9 C  SpO2: 98% 97%    Level of Consciousness: alert  Pain: mild   Side Effects:None  Catheter Site Exam:clean  Anti-Coag Meds (From admission, onward)    Start     Dose/Rate Route Frequency Ordered Stop   08/19/22 0800  heparin injection 5,000 Units        5,000 Units Subcutaneous Every 8 hours 08/18/22 1803        Epidural / Intrathecal (From admission, onward)    Start     Dose/Rate Route Frequency Ordered Stop   08/19/22 1430  ropivacaine (PF) 2 mg/mL (0.2%) (NAROPIN) injection        10 mL/hr 10 mL/hr  Epidural Continuous 08/19/22 1338          Plan: Continue current therapy of postop epidural at surgeon's request  Earl Lites P Makyle Eslick

## 2022-08-21 NOTE — Evaluation (Signed)
Occupational Therapy Evaluation Patient Details Name: David Harrell MRN: [id number] DOB: 1961/03/02 Today's Date: 08/21/2022   History of Present Illness Patient is a 62 y/o male admitted 08/18/22 for open distal pancreatectomy with splenectomy and liver biopsy due to pancreatic adenocarcinoma.  PMH positive for tobacco use, multiple back surgeries, GERD, HTN and neck surgery.   Clinical Impression   Patient supine in bed, cleared for mobility from RN. Patient admitted for above and presents with problem list below. He reports PTA independent with ADLs, light IADLs and mobility using cane in community.  Today, he requires up to min assist for LB ADLs and transfers; supervision for bed mobility and UB ADLs.  Pt declined OOB to recliner.  He remains limited by impaired balance, pain, and decreaed activity tolerance. Based on performance today, believe he will best benefit from continued OT services acutely and after dc at [facility] level (pending progress) to optimize independence, safety and return to PLOF.      Recommendations for follow up therapy are one component of a multi-disciplinary discharge planning process, led by the attending physician.  Recommendations may be updated based on patient status, additional functional criteria and insurance authorization.   Assistance Recommended at Discharge Frequent or constant Supervision/Assistance  Patient can return home with the following A little help with walking and/or transfers;A lot of help with bathing/dressing/bathroom;Assistance with cooking/housework;Assist for transportation;Help with stairs or ramp for entrance    Functional Status Assessment  Patient has had a recent decline in their functional status and demonstrates the ability to make significant improvements in function in a reasonable and predictable amount of time.  Equipment Recommendations  BSC/3in1    Recommendations for Other Services       Precautions / Restrictions  Precautions Precautions: Fall;Other (comment) Precaution Comments: abd protection precs, JP drain, epidural Restrictions Weight Bearing Restrictions: No      Mobility Bed Mobility Overal bed mobility: Needs Assistance Bed Mobility: Rolling, Sidelying to Sit, Sit to Sidelying Rolling: Supervision Sidelying to sit: Supervision     Sit to sidelying: Supervision General bed mobility comments: for safety, rolling to L side with bed rail up and therapist removing after sitting up at EOB    Transfers Overall transfer level: Needs assistance Equipment used: Rolling walker (2 wheels) Transfers: Sit to/from Stand Sit to Stand: Min assist           General transfer comment: min assist to power up into standing, cueing for hand placement and safety;  Pt with L LE buckling due to decreased sensation      Balance Overall balance assessment: Needs assistance Sitting-balance support: No upper extremity supported Sitting balance-Leahy Scale: Fair     Standing balance support: Bilateral upper extremity supported, During functional activity Standing balance-Leahy Scale: Poor Standing balance comment: relies on BUE and external support                           ADL either performed or assessed with clinical judgement   ADL Overall ADL's : Needs assistance/impaired     Grooming: Set up;Sitting           Upper Body Dressing : Set up;Sitting   Lower Body Dressing: Moderate assistance;Sit to/from stand Lower Body Dressing Details (indicate cue type and reason): requires assist with L LE, min assist in standing and relies on BUE support   Toilet Transfer Details (indicate cue type and reason): pt declined  Functional mobility during ADLs: Minimal assistance;Rolling walker (2 wheels);Cueing for safety       Vision   Vision Assessment?: No apparent visual deficits     Perception     Praxis      Pertinent Vitals/Pain Pain Assessment Pain Assessment:  0-10 Pain Score: 6  Pain Location: abdomen Pain Descriptors / Indicators: Discomfort, Operative site guarding, Grimacing Pain Intervention(s): Limited activity within patient's tolerance, Monitored during session, Repositioned     Hand Dominance Right   Extremity/Trunk Assessment Upper Extremity Assessment Upper Extremity Assessment: Generalized weakness   Lower Extremity Assessment Lower Extremity Assessment: Defer to PT evaluation   Cervical / Trunk Assessment Cervical / Trunk Assessment: Kyphotic   Communication Communication Communication: No difficulties   Cognition Arousal/Alertness: Awake/alert Behavior During Therapy: Flat affect Overall Cognitive Status: No family/caregiver present to determine baseline cognitive functioning                                 General Comments: appears WFL     General Comments  VSS on 2L [place]    Exercises     Shoulder Instructions      Home Living Family/patient expects to be discharged to:: Private residence Living Arrangements: Spouse/significant other Available Help at Discharge: Family Type of Home: House Home Access: Stairs to enter Secretary/administrator of Steps: 2 Entrance Stairs-Rails: Can reach both Home Layout: One level     Bathroom Shower/Tub: Producer, television/film/video: Standard     Home Equipment: Grab bars - tub/shower;Rolling Environmental consultant (2 wheels);Cane - single point;Shower seat          Prior Functioning/Environment Prior Level of Function : Independent/Modified Independent;Driving             Mobility Comments: uses cane in commuinty, intermittently at home as needed ADLs Comments: independent ADLs, driving, light IADLs        OT Problem List: Decreased strength;Decreased activity tolerance;Impaired balance (sitting and/or standing);Pain;Decreased knowledge of precautions;Decreased knowledge of use of DME or AE      OT Treatment/Interventions: Self-care/ADL  training;Therapeutic exercise;DME and/or AE instruction;Therapeutic activities;Patient/family education;Balance training    OT Goals(Current goals can be found in the care plan section) Acute Rehab OT Goals Patient Stated Goal: feel better OT Goal Formulation: With patient Time For Goal Achievement: 09/04/22 Potential to Achieve Goals: Good  OT Frequency: Min 2X/week    Co-evaluation              AM-PAC OT "6 Clicks" Daily Activity     Outcome Measure Help from another person eating meals?: None Help from another person taking care of personal grooming?: A Little Help from another person toileting, which includes using toliet, bedpan, or urinal?: A Lot Help from another person bathing (including washing, rinsing, drying)?: A Lot Help from another person to put on and taking off regular upper body clothing?: A Little Help from another person to put on and taking off regular lower body clothing?: A Lot 6 Click Score: 16   End of Session Equipment Utilized During Treatment: Rolling walker (2 wheels) Nurse Communication: Mobility status  Activity Tolerance: Patient tolerated treatment well Patient left: in bed;with call bell/phone within reach  OT Visit Diagnosis: Other abnormalities of gait and mobility (R26.89);Muscle weakness (generalized) (M62.81);Pain Pain - part of body:  (abd)                Time: 1610-9604 OT Time Calculation (min): 18 min  Charges:  OT General Charges $OT Visit: 1 Visit OT Evaluation $OT Eval Moderate Complexity: 1 Mod  Barry Brunner, OT Acute Rehabilitation Services Office [phone]   Chancy Milroy 08/21/2022, 10:12 AM

## 2022-08-21 NOTE — Progress Notes (Signed)
3 Days Post-Op  Subjective: Pain improving, feels he is using the PCA a little less. Having some nausea this morning, but tolerated full liquids yesterday. Also reports hiccups. Passing flatus.  Still has numbness in left foot and leg.   Objective: Vital signs in last 24 hours: Temp:  [98.1 F (36.7 C)-98.8 F (37.1 C)] 98.5 F (36.9 C) (06/15 0406) Pulse Rate:  [74-92] 85 (06/15 0406) Resp:  [12-23] 13 (06/15 0406) BP: (117-145)/(71-106) 135/77 (06/15 0406) SpO2:  [98 %-100 %] 99 % (06/15 0406) FiO2 (%):  [28 %] 28 % (06/15 0342) Last BM Date : 08/18/22  Intake/Output from previous day: 06/14 0701 - 06/15 0700 In: 2207.6 [P.O.:240; I.V.:1551; IV Piggyback:210] Out: 3115 [Urine:3050; Drains:65] Intake/Output this shift: Total I/O In: 210 [Other:100; IV Piggyback:110] Out: 575 [Urine:550; Drains:25]  PE: General: resting comfortably, NAD Neuro: alert and oriented, no focal deficits Resp: normal work of breathing CV: RRR Abdomen: soft, nondistended, nontender. Upper midline incision is clean and dry with no erythema or induration. LUQ JP serosanguinous. Extremities: warm and well-perfused   Lab Results:  Recent Labs    08/20/22 0332 08/21/22 0309  WBC 22.8* 17.8*  HGB 9.7* 9.5*  HCT 30.8* 28.6*  PLT 243 275   BMET Recent Labs    08/20/22 0332 08/21/22 0309  NA 134* 136  K 4.0 3.6  CL 102 99  CO2 22 28  GLUCOSE 84 117*  BUN 6 5*  CREATININE 0.67 0.57*  CALCIUM 7.8* 8.2*   PT/INR No results for input(s): "LABPROT", "INR" in the last 72 hours. CMP     Component Value Date/Time   NA 136 08/21/2022 0309   NA 139 06/30/2014 1750   K 3.6 08/21/2022 0309   K 3.3 (L) 06/30/2014 1750   CL 99 08/21/2022 0309   CL 105 06/30/2014 1750   CO2 28 08/21/2022 0309   CO2 20 (L) 06/30/2014 1750   GLUCOSE 117 (H) 08/21/2022 0309   GLUCOSE 115 (H) 06/30/2014 1750   BUN 5 (L) 08/21/2022 0309   BUN 10 06/30/2014 1750   CREATININE 0.57 (L) 08/21/2022 0309    CREATININE 0.78 07/16/2022 1101   CREATININE 0.85 06/30/2014 1750   CALCIUM 8.2 (L) 08/21/2022 0309   CALCIUM 8.9 06/30/2014 1750   PROT 7.9 07/16/2022 1101   ALBUMIN 4.4 07/16/2022 1101   AST 17 07/16/2022 1101   ALT 15 07/16/2022 1101   ALKPHOS 57 07/16/2022 1101   BILITOT 0.5 07/16/2022 1101   GFRNONAA >60 08/21/2022 0309   GFRNONAA >60 07/16/2022 1101   GFRNONAA >60 06/30/2014 1750   GFRAA >60 04/13/2018 1722   GFRAA >60 06/30/2014 1750   Lipase     Component Value Date/Time   LIPASE 26 07/02/2022 1503       Studies/Results: No results found.    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 - Nausea and hiccups - remain on full liquids today - SLIV - Pain control: Continue epidural, may request removal tomorrow if able to advance diet. Suspect left leg numbness is related to epidural and will improve when removed. Discontinue PCA, transition to prn oxycodone/dilaudid. Continue scheduled tylenol and robaxin.  - Sliding scale insulin q4h. Hgb A1c 6.5. - Mobilize, PT ordered - POD1 drain amylase pending. Send POD3 drain amylase today. - VTE: SQH, SCDs - Dispo: progressive care     LOS: 3 days    Sophronia Simas, MD [facility]  General, Hepatobiliary and Pancreatic Surgery 08/21/22 6:57 AM

## 2022-08-22 LAB — GLUCOSE, CAPILLARY
Glucose-Capillary: 105 mg/dL — ABNORMAL HIGH (ref 70–99)
Glucose-Capillary: 95 mg/dL (ref 70–99)
Glucose-Capillary: 179 mg/dL — ABNORMAL HIGH (ref 70–99)
Glucose-Capillary: 80 mg/dL (ref 70–99)
Glucose-Capillary: 99 mg/dL (ref 70–99)

## 2022-08-22 LAB — AMYLASE, BODY FLUID (OTHER): Amylase, Body Fluid: 90 U/L

## 2022-08-22 MED ORDER — METHOCARBAMOL 500 MG PO TABS
1000.0000 mg | ORAL_TABLET | Freq: Four times a day (QID) | ORAL | Status: DC
  Administered 2022-08-22 – 2022-08-24 (×10): 1000 mg via ORAL
  Filled 2022-08-22 (×10): qty 2

## 2022-08-22 MED ORDER — INSULIN ASPART 100 UNIT/ML IJ SOLN
0.0000 [IU] | Freq: Three times a day (TID) | INTRAMUSCULAR | Status: DC
  Administered 2022-08-22 – 2022-08-23 (×2): 2 [IU] via SUBCUTANEOUS
  Administered 2022-08-23: 1 [IU] via SUBCUTANEOUS
  Administered 2022-08-23: 2 [IU] via SUBCUTANEOUS
  Administered 2022-08-24: 3 [IU] via SUBCUTANEOUS
  Administered 2022-08-24: 1 [IU] via SUBCUTANEOUS

## 2022-08-22 MED ORDER — ENOXAPARIN SODIUM 40 MG/0.4ML IJ SOSY
40.0000 mg | PREFILLED_SYRINGE | Freq: Every day | INTRAMUSCULAR | Status: DC
  Administered 2022-08-22 – 2022-08-23 (×2): 40 mg via SUBCUTANEOUS
  Filled 2022-08-22 (×2): qty 0.4

## 2022-08-22 MED ORDER — POLYETHYLENE GLYCOL 3350 17 G PO PACK
17.0000 g | PACK | Freq: Every day | ORAL | Status: DC
  Administered 2022-08-22 – 2022-08-23 (×2): 17 g via ORAL
  Filled 2022-08-22 (×3): qty 1

## 2022-08-22 MED ORDER — INSULIN ASPART 100 UNIT/ML IJ SOLN: 0.0000 [IU] | Freq: Every day | INTRAMUSCULAR | Status: DC

## 2022-08-22 MED ORDER — SENNA 8.6 MG PO TABS
2.0000 | ORAL_TABLET | Freq: Every day | ORAL | Status: DC
  Administered 2022-08-22 – 2022-08-23 (×2): 17.2 mg via ORAL
  Filled 2022-08-22 (×2): qty 2

## 2022-08-22 NOTE — Progress Notes (Addendum)
    4 Days Post-Op  Subjective: Adequate pain control off PCA. Nausea improving. Did not ambulate yesterday.   Objective: Vital signs in last 24 hours: Temp:  [98 F (36.7 C)-99.1 F (37.3 C)] 98.3 F (36.8 C) (06/16 0344) Pulse Rate:  [74-92] 78 (06/16 0400) Resp:  [8-20] 8 (06/16 0400) BP: (122-153)/(64-87) 122/64 (06/16 0344) SpO2:  [92 %-99 %] 92 % (06/16 0400) Last BM Date : 08/18/22  Intake/Output from previous day: 06/15 0701 - 06/16 0700 In: 1129.8 [P.O.:650; IV Piggyback:220] Out: 3870 [Urine:3800; Drains:70] Intake/Output this shift: Total I/O In: 146.9 [Other:146.9] Out: 1300 [Urine:1300]  PE: General: resting comfortably, NAD Neuro: alert and oriented, no focal deficits Resp: normal work of breathing CV: RRR Abdomen: soft, nondistended, nontender. Upper midline incision is clean and dry with no erythema or induration. LUQ JP serosanguinous. Extremities: warm and well-perfused   Lab Results:  Recent Labs    08/20/22 0332 08/21/22 0309  WBC 22.8* 17.8*  HGB 9.7* 9.5*  HCT 30.8* 28.6*  PLT 243 275   BMET Recent Labs    08/20/22 0332 08/21/22 0309  NA 134* 136  K 4.0 3.6  CL 102 99  CO2 22 28  GLUCOSE 84 117*  BUN 6 5*  CREATININE 0.67 0.57*  CALCIUM 7.8* 8.2*   PT/INR No results for input(s): "LABPROT", "INR" in the last 72 hours. CMP     Component Value Date/Time   NA 136 08/21/2022 0309   NA 139 06/30/2014 1750   K 3.6 08/21/2022 0309   K 3.3 (L) 06/30/2014 1750   CL 99 08/21/2022 0309   CL 105 06/30/2014 1750   CO2 28 08/21/2022 0309   CO2 20 (L) 06/30/2014 1750   GLUCOSE 117 (H) 08/21/2022 0309   GLUCOSE 115 (H) 06/30/2014 1750   BUN 5 (L) 08/21/2022 0309   BUN 10 06/30/2014 1750   CREATININE 0.57 (L) 08/21/2022 0309   CREATININE 0.78 07/16/2022 1101   CREATININE 0.85 06/30/2014 1750   CALCIUM 8.2 (L) 08/21/2022 0309   CALCIUM 8.9 06/30/2014 1750   PROT 7.9 07/16/2022 1101   ALBUMIN 4.4 07/16/2022 1101   AST 17  07/16/2022 1101   ALT 15 07/16/2022 1101   ALKPHOS 57 07/16/2022 1101   BILITOT 0.5 07/16/2022 1101   GFRNONAA >60 08/21/2022 0309   GFRNONAA >60 07/16/2022 1101   GFRNONAA >60 06/30/2014 1750   GFRAA >60 04/13/2018 1722   GFRAA >60 06/30/2014 1750   Lipase     Component Value Date/Time   LIPASE 26 07/02/2022 1503       Studies/Results: No results found.    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 - Advance to soft diet - Pain control: Will request epidural removal today. Anticipate improvement in LLE numbness with removal. Continue home MS Contin, prn oxycodone and dilaudid, and scheduled tylenol and robaxin. - Sliding scale insulin ACHS. Hgb A1c 6.5. - Needs to ambulate more, PT following - POD1 drain amylase pending. POD3 amylase was not sent yesterday, sent this morning. - Will give post-splenectomy vaccines prior to discharge. - VTE: SCDs, SQH on hold for epidural removal. Switch to lovenox after epidural is removed. - Dispo: progressive care     LOS: 4 days    Sophronia Simas, MD [facility] Surgery General, Hepatobiliary and Pancreatic Surgery 08/22/22 6:53 AM

## 2022-08-22 NOTE — Plan of Care (Signed)

## 2022-08-22 NOTE — Anesthesia Post-op Follow-up Note (Signed)
  Anesthesia Pain Follow-up Note  [name] [facility]  Day #: 7  Date of Follow-up: 08/22/2022 Time: 8:38 AM  Last Vitals:  Vitals:   08/22/22 0400 08/22/22 0749  BP:    Pulse: 78 79  Resp: (!) 8 13  Temp:  36.8 C  SpO2: 92% 93%    Level of Consciousness: alert  Pain: moderate   Side Effects: Pt has numbness in left leg that has bee present since he awoke from surgery.  Catheter Site Exam:clean, dry, no drainage  Epidural / Intrathecal (From admission, onward)    Start     Dose/Rate Route Frequency Ordered Stop   08/19/22 1430  ropivacaine (PF) 2 mg/mL (0.2%) (NAROPIN) injection        10 mL/hr 10 mL/hr  Epidural Continuous 08/19/22 1338          Plan: Catheter removed/tip intact at surgeon's request and D/C Infusion at surgeon's request  Catheter was removed at 0830 08/22/22.  Prophylactic lovenox may be initiated not before 1230 08/22/22.  Younique Casad S

## 2022-08-23 LAB — GLUCOSE, CAPILLARY
Glucose-Capillary: 133 mg/dL — ABNORMAL HIGH (ref 70–99)
Glucose-Capillary: 170 mg/dL — ABNORMAL HIGH (ref 70–99)
Glucose-Capillary: 157 mg/dL — ABNORMAL HIGH (ref 70–99)
Glucose-Capillary: 196 mg/dL — ABNORMAL HIGH (ref 70–99)

## 2022-08-23 LAB — SURGICAL PATHOLOGY

## 2022-08-23 LAB — AMYLASE, BODY FLUID (OTHER): Amylase, Body Fluid: 15 U/L

## 2022-08-23 MED ORDER — HAEMOPHILUS B POLYSAC CONJ VAC IM SOLR
0.5000 mL | Freq: Once | INTRAMUSCULAR | Status: AC
  Administered 2022-08-23: 0.5 mL via INTRAMUSCULAR
  Filled 2022-08-23: qty 0.5

## 2022-08-23 MED ORDER — MENINGOCOCCAL A C Y&W-135 OLIG IM SOLN
0.5000 mL | Freq: Once | INTRAMUSCULAR | Status: AC
  Administered 2022-08-23: 0.5 mL via INTRAMUSCULAR
  Filled 2022-08-23 (×5): qty 0.5

## 2022-08-23 MED ORDER — PNEUMOCOCCAL 20-VAL CONJ VACC 0.5 ML IM SUSY
0.5000 mL | PREFILLED_SYRINGE | Freq: Once | INTRAMUSCULAR | Status: AC
  Administered 2022-08-23: 0.5 mL via INTRAMUSCULAR
  Filled 2022-08-23: qty 0.5

## 2022-08-23 MED ORDER — HYDROMORPHONE HCL 1 MG/ML IJ SOLN
1.0000 mg | Freq: Four times a day (QID) | INTRAMUSCULAR | Status: DC | PRN
  Administered 2022-08-23 – 2022-08-24 (×3): 1 mg via INTRAVENOUS
  Filled 2022-08-23 (×5): qty 1

## 2022-08-23 MED ORDER — MENINGOCOCCAL VAC B (OMV) IM SUSY
0.5000 mL | PREFILLED_SYRINGE | Freq: Once | INTRAMUSCULAR | Status: AC
  Administered 2022-08-23: 0.5 mL via INTRAMUSCULAR
  Filled 2022-08-23: qty 0.5

## 2022-08-23 NOTE — Progress Notes (Signed)
Occupational Therapy Treatment Patient Details Name: David Harrell MRN: [id number] DOB: 12/21/1960 Today's Date: 08/23/2022   History of present illness Patient is a 62 y/o male admitted 08/18/22 for open distal pancreatectomy with splenectomy and liver biopsy due to pancreatic adenocarcinoma.  PMH positive for tobacco use, multiple back surgeries, GERD, HTN and neck surgery.   OT comments  Pt ambulated to bathroom with supervision and RW, completed toileting modified independently and groomed in standing. Pt able to don socks once returned to EOB. Recommended seated showering for safety and energy conservation once cleared by MD to shower. Pt demonstrating some memory deficits, did not recall he had JP drain or conversation with CM about DME order.    Recommendations for follow up therapy are one component of a multi-disciplinary discharge planning process, led by the attending physician.  Recommendations may be updated based on patient status, additional functional criteria and insurance authorization.    Assistance Recommended at Discharge Frequent or constant Supervision/Assistance  Patient can return home with the following  A little help with walking and/or transfers;A lot of help with bathing/dressing/bathroom;Assistance with cooking/housework;Assist for transportation;Help with stairs or ramp for entrance   Equipment Recommendations  BSC/3in1    Recommendations for Other Services      Precautions / Restrictions Precautions Precautions: Fall;Other (comment) Precaution Comments: abd protection precs, JP drain Restrictions Weight Bearing Restrictions: No       Mobility Bed Mobility               General bed mobility comments: pt seated at EOB upon arrival, returned to EOB to eat lunch    Transfers Overall transfer level: Needs assistance Equipment used: Rolling walker (2 wheels) Transfers: Sit to/from Stand Sit to Stand: Supervision           General transfer  comment: for safety     Balance Overall balance assessment: Needs assistance   Sitting balance-Leahy Scale: Good     Standing balance support: No upper extremity supported Standing balance-Leahy Scale: Fair Standing balance comment: can stand at sink without UE support for grooming                           ADL either performed or assessed with clinical judgement   ADL Overall ADL's : Needs assistance/impaired Eating/Feeding: Independent;Sitting   Grooming: Wash/dry hands;Standing;Supervision/safety               Lower Body Dressing: Set up;Sitting/lateral leans   Toilet Transfer: Supervision/safety;Ambulation;Comfort height toilet   Toileting- Clothing Manipulation and Hygiene: Modified independent;Sitting/lateral lean       Functional mobility during ADLs: Supervision/safety;Rolling walker (2 wheels)      Extremity/Trunk Assessment              Vision       Perception     Praxis      Cognition Arousal/Alertness: Awake/alert Behavior During Therapy: Flat affect Overall Cognitive Status: Impaired/Different from baseline Area of Impairment: Memory                     Memory: Decreased short-term memory         General Comments: decreased awareness of JP drain, attempting to walk to bathroom having sat up around bed rail, had forgotten he was going to get a RW for home        Exercises      Shoulder Instructions       General Comments  Pertinent Vitals/ Pain       Pain Assessment Pain Assessment: Faces Faces Pain Scale: Hurts a little bit Pain Location: abdomen Pain Descriptors / Indicators: Operative site guarding, Sore Pain Intervention(s): Monitored during session  Home Living                                          Prior Functioning/Environment              Frequency  Min 2X/week        Progress Toward Goals  OT Goals(current goals can now be found in the care plan  section)  Progress towards OT goals: Progressing toward goals  Acute Rehab OT Goals OT Goal Formulation: With patient Time For Goal Achievement: 09/04/22 Potential to Achieve Goals: Good  Plan Discharge plan remains appropriate    Co-evaluation                 AM-PAC OT "6 Clicks" Daily Activity     Outcome Measure   Help from another person eating meals?: None Help from another person taking care of personal grooming?: A Little Help from another person toileting, which includes using toliet, bedpan, or urinal?: A Little Help from another person bathing (including washing, rinsing, drying)?: A Little Help from another person to put on and taking off regular upper body clothing?: None Help from another person to put on and taking off regular lower body clothing?: A Little 6 Click Score: 20    End of Session Equipment Utilized During Treatment: Rolling walker (2 wheels);Gait belt  OT Visit Diagnosis: Other abnormalities of gait and mobility (R26.89);Muscle weakness (generalized) (M62.81);Pain   Activity Tolerance Patient tolerated treatment well   Patient Left in bed;with call bell/phone within reach   Nurse Communication          Time: 8119-1478 OT Time Calculation (min): 26 min  Charges: OT General Charges $OT Visit: 1 Visit OT Treatments $Self Care/Home Management : 23-37 mins  Berna Spare, OTR/L Acute Rehabilitation Services Office: [phone]   Evern Bio 08/23/2022, 3:57 PM

## 2022-08-23 NOTE — TOC Initial Note (Signed)
Transition of Care (TOC) - Initial/Assessment Note  Donn Pierini RN,BSN Transitions of Care Unit 4NP (Non Trauma)- RN Case Manager See Treatment Team for direct Phone #   Patient Details  Name: David Harrell MRN: [id number] Date of Birth: 1960-08-03  Transition of Care [facility]) CM/SW Contact:    Darrold Span, RN Phone Number: 08/23/2022, 11:54 AM  Clinical Narrative:                 CM spoke with pt at bedside along with sister to discuss transition needs.  Per pt he lives at home w/ wife and has rolling walker at home.   Discussed HH recommendations and orders, list provided for [facility] choice Per CMS guidelines from [email or web address] website with star ratings (copy placed in shadow chart)- pt voiced he does not have a preference and is agreeable for CM to secure on his behalf reaching out to the ones with the higher star ratings first.   Address, phone # and PCP all confirmed.   Call made to [facility] for [facility] needs- referral has been accepted. They will contact pt post discharge to schedule.     Expected Discharge Plan: Home w Home Health Services Barriers to Discharge: No Barriers Identified   Patient Goals and CMS Choice Patient states their goals for this hospitalization and ongoing recovery are:: return home CMS Medicare.gov Compare Post Acute Care list provided to:: Patient Choice offered to / list presented to : Patient      Expected Discharge Plan and Services   Discharge Planning Services: CM Consult Post Acute Care Choice: Home Health Living arrangements for the past 2 months: Single Family Home                 DME Arranged: N/A DME Agency: NA       HH Arranged: PT, OT HH Agency: [facility] Date [facility] Agency Contacted: 08/23/22 Time HH Agency Contacted: 1154 Representative spoke with at [facility] Agency: Kandee Keen  Prior Living Arrangements/Services Living arrangements for the past 2 months: Single Family Home Lives with:: Spouse Patient language and  need for interpreter reviewed:: Yes Do you feel safe going back to the place where you live?: Yes      Need for Family Participation in Patient Care: Yes (Comment) Care giver support system in place?: Yes (comment) Current home services: DME (Rolling walker) Criminal Activity/Legal Involvement Pertinent to Current Situation/Hospitalization: No - Comment as needed  Activities of Daily Living Home Assistive Devices/Equipment: Cane (specify quad or straight), Blood pressure cuff, Eyeglasses ADL Screening (condition at time of admission) Patient's cognitive ability adequate to safely complete daily activities?: Yes Is the patient deaf or have difficulty hearing?: Yes Does the patient have difficulty seeing, even when wearing glasses/contacts?: No Does the patient have difficulty concentrating, remembering, or making decisions?: Yes Patient able to express need for assistance with ADLs?: Yes Does the patient have difficulty dressing or bathing?: No Independently performs ADLs?: Yes (appropriate for developmental age) Does the patient have difficulty walking or climbing stairs?: Yes Weakness of Legs: Left Weakness of Arms/Hands: None  Permission Sought/Granted Permission sought to share information with : Facility Industrial/product designer granted to share information with : Yes, Verbal Permission Granted     Permission granted to share info w AGENCY: HH        Emotional Assessment Appearance:: Appears stated age Attitude/Demeanor/Rapport: Engaged Affect (typically observed): Accepting, Appropriate Orientation: : Oriented to Self, Oriented to Place, Oriented to  Time, Oriented to Situation Alcohol /  Substance Use: Not Applicable Psych Involvement: No (comment)  Admission diagnosis:  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 [place] [address] Phone: [phone] Fax: [phone]     Social Determinants of Health (SDOH) Social History: SDOH Screenings   Food Insecurity: No Food Insecurity (08/19/2022)  Housing: Low Risk  (08/19/2022)  Transportation Needs: No Transportation Needs (08/19/2022)  Utilities: Not At Risk (08/19/2022)  Depression (PHQ2-9): Low Risk  (07/16/2022)  Tobacco Use: High Risk (08/19/2022)   SDOH Interventions:     Readmission Risk Interventions    08/23/2022   11:54 AM  Readmission Risk Prevention Plan  Transportation Screening Complete  Home Care Screening Complete  Medication Review (RN CM) Complete

## 2022-08-23 NOTE — Progress Notes (Signed)
    5 Days Post-Op  Subjective: Epidural removed yesterday, LLE numbness resolved. Tolerating solid food, no vomiting. Had a small BM yesterday.   Objective: Vital signs in last 24 hours: Temp:  [98.2 F (36.8 C)-98.7 F (37.1 C)] 98.4 F (36.9 C) (06/17 0328) Pulse Rate:  [75-95] 83 (06/17 0328) Resp:  [13-20] 20 (06/17 0328) BP: (111-140)/(64-82) 111/76 (06/17 0328) SpO2:  [93 %-99 %] 99 % (06/16 1512) Last BM Date : 08/22/22  Intake/Output from previous day: 06/16 0701 - 06/17 0700 In: 505.5 [P.O.:480] Out: 760 [Urine:650; Drains:110] Intake/Output this shift: Total I/O In: -  Out: 400 [Urine:400]  PE: General: resting comfortably, NAD Neuro: alert and oriented, no focal deficits Resp: normal work of breathing Abdomen: soft, nondistended, nontender. Upper midline incision is clean and dry with no erythema or induration. LUQ JP serosanguinous. Extremities: warm and well-perfused   Lab Results:  Recent Labs    08/21/22 0309  WBC 17.8*  HGB 9.5*  HCT 28.6*  PLT 275   BMET Recent Labs    08/21/22 0309  NA 136  K 3.6  CL 99  CO2 28  GLUCOSE 117*  BUN 5*  CREATININE 0.57*  CALCIUM 8.2*   PT/INR No results for input(s): "LABPROT", "INR" in the last 72 hours. CMP     Component Value Date/Time   NA 136 08/21/2022 0309   NA 139 06/30/2014 1750   K 3.6 08/21/2022 0309   K 3.3 (L) 06/30/2014 1750   CL 99 08/21/2022 0309   CL 105 06/30/2014 1750   CO2 28 08/21/2022 0309   CO2 20 (L) 06/30/2014 1750   GLUCOSE 117 (H) 08/21/2022 0309   GLUCOSE 115 (H) 06/30/2014 1750   BUN 5 (L) 08/21/2022 0309   BUN 10 06/30/2014 1750   CREATININE 0.57 (L) 08/21/2022 0309   CREATININE 0.78 07/16/2022 1101   CREATININE 0.85 06/30/2014 1750   CALCIUM 8.2 (L) 08/21/2022 0309   CALCIUM 8.9 06/30/2014 1750   PROT 7.9 07/16/2022 1101   ALBUMIN 4.4 07/16/2022 1101   AST 17 07/16/2022 1101   ALT 15 07/16/2022 1101   ALKPHOS 57 07/16/2022 1101   BILITOT 0.5 07/16/2022  1101   GFRNONAA >60 08/21/2022 0309   GFRNONAA >60 07/16/2022 1101   GFRNONAA >60 06/30/2014 1750   GFRAA >60 04/13/2018 1722   GFRAA >60 06/30/2014 1750   Lipase     Component Value Date/Time   LIPASE 26 07/02/2022 1503       Studies/Results: No results found.    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 - Soft diet - Pain control: Continue home MS Contin, prn oxycodone and dilaudid, and scheduled tylenol and robaxin. - Sliding scale insulin ACHS. Hgb A1c 6.5. Insulin requirements are very minimal. - Needs to ambulate more today. - POD1 drain amylase 90. POD4 amylase pending. - Will give post-splenectomy vaccines prior to discharge. - VTE: lovenox, SCDs - Dispo: med-surg status. Anticipate discharge home tomorrow.    LOS: 5 days    Sophronia Simas, MD [facility] General, Hepatobiliary and Pancreatic Surgery 08/23/22 6:53 AM

## 2022-08-23 NOTE — Progress Notes (Signed)
Physical Therapy Treatment Patient Details Name: David Harrell MRN: [id number] DOB: 05/29/1960 Today's Date: 08/23/2022   History of Present Illness Patient is a 62 y/o male admitted 08/18/22 for open distal pancreatectomy with splenectomy and liver biopsy due to pancreatic adenocarcinoma.  PMH positive for tobacco use, multiple back surgeries, GERD, HTN and neck surgery.    PT Comments    Patient now with epidural discontinued and LLE symptoms resolved. Able to walk with RW x 100 ft with supervision. Education on proper use of RW and pt reports he needs a RW for home. Plan is to ?discharge 6/18 per chart and pt. Agree with need for RW at this time. HHPT can help progress pt to no device with ambulation.     Recommendations for follow up therapy are one component of a multi-disciplinary discharge planning process, led by the attending physician.  Recommendations may be updated based on patient status, additional functional criteria and insurance authorization.  Follow Up Recommendations       Assistance Recommended at Discharge Set up Supervision/Assistance  Patient can return home with the following A little help with walking and/or transfers;Assistance with cooking/housework;Assist for transportation;Help with stairs or ramp for entrance   Equipment Recommendations  Rolling walker (2 wheels) (now states he does not have a RW at home; pt denied needing BSC)    Recommendations for Other Services       Precautions / Restrictions Precautions Precautions: Fall;Other (comment) Precaution Comments: abd protection precs, JP drain Restrictions Weight Bearing Restrictions: No     Mobility  Bed Mobility Overal bed mobility: Needs Assistance Bed Mobility: Rolling, Sidelying to Sit Rolling: Modified independent (Device/Increase time) Sidelying to sit: Modified independent (Device/Increase time)       General bed mobility comments: rolling to L side with bed rail up and therapist  removing after sitting up at EOB (pt preference)    Transfers Overall transfer level: Needs assistance Equipment used: Rolling walker (2 wheels) Transfers: Sit to/from Stand Sit to Stand: Supervision           General transfer comment: vc for proper hand placement, however pt moves quickly and up before he could correct his hand placement; good placement with stand to sit    Ambulation/Gait Ambulation/Gait assistance: Min guard, Supervision Gait Distance (Feet): 100 Feet Assistive device: Rolling walker (2 wheels) Gait Pattern/deviations: Step-through pattern, Decreased stride length       General Gait Details: good upright posture; cues to roll RW and not lift it to advance it forward; JP drain held by PT while walking   Stairs             Wheelchair Mobility    Modified Rankin (Stroke Patients Only)       Balance Overall balance assessment: Needs assistance Sitting-balance support: No upper extremity supported Sitting balance-Leahy Scale: Fair     Standing balance support: No upper extremity supported Standing balance-Leahy Scale: Fair Standing balance comment: can stand without UE support; dynamically needs RW                            Cognition Arousal/Alertness: Awake/alert Behavior During Therapy: Flat affect Overall Cognitive Status: Within Functional Limits for tasks assessed                                          Exercises  General Comments General comments (skin integrity, edema, etc.): Pt educated that MD wants him to walk 4x/day and he said he would try.      Pertinent Vitals/Pain Pain Assessment Pain Assessment: 0-10 Pain Score: 6  Pain Location: abdomen Pain Descriptors / Indicators: Discomfort, Operative site guarding, Grimacing Pain Intervention(s): Limited activity within patient's tolerance, Premedicated before session    Home Living                          Prior Function             PT Goals (current goals can now be found in the care plan section) Acute Rehab PT Goals Patient Stated Goal: to return home with family support Time For Goal Achievement: 09/02/22 Potential to Achieve Goals: Good Progress towards PT goals: Progressing toward goals    Frequency    Min 3X/week      PT Plan Current plan remains appropriate    Co-evaluation              AM-PAC PT "6 Clicks" Mobility   Outcome Measure  Help needed turning from your back to your side while in a flat bed without using bedrails?: A Little Help needed moving from lying on your back to sitting on the side of a flat bed without using bedrails?: A Little Help needed moving to and from a bed to a chair (including a wheelchair)?: A Little Help needed standing up from a chair using your arms (e.g., wheelchair or bedside chair)?: A Little Help needed to walk in hospital room?: A Little Help needed climbing 3-5 steps with a railing? : A Little 6 Click Score: 18    End of Session   Activity Tolerance: Patient tolerated treatment well Patient left: in chair;with call bell/phone within reach (per RN, pt walks modified independently to bathroom with RW therefore alarm not set) Nurse Communication: Mobility status PT Visit Diagnosis: Other abnormalities of gait and mobility (R26.89);Muscle weakness (generalized) (M62.81)     Time: 1610-9604 PT Time Calculation (min) (ACUTE ONLY): 15 min  Charges:  $Gait Training: 8-22 mins                      David Harrell, PT Acute Rehabilitation Services  Office [phone]    Zena Amos 08/23/2022, 10:10 AM

## 2022-08-24 ENCOUNTER — Other Ambulatory Visit (HOSPITAL_COMMUNITY): Payer: Self-pay

## 2022-08-24 LAB — TYPE AND SCREEN
ABO/RH(D): A NEG
Antibody Screen: NEGATIVE
Unit division: 0
Unit division: 0
Unit division: 0
Unit division: 0

## 2022-08-24 LAB — BPAM RBC
Blood Product Expiration Date: 202407112359
Blood Product Expiration Date: 202407112359
Blood Product Expiration Date: 202407132359
ISSUE DATE / TIME: 202406121022
ISSUE DATE / TIME: 202406121022
ISSUE DATE / TIME: 202406121022
ISSUE DATE / TIME: 202406121022
Unit Type and Rh: 600
Unit Type and Rh: 600

## 2022-08-24 LAB — GLUCOSE, CAPILLARY
Glucose-Capillary: 145 mg/dL — ABNORMAL HIGH (ref 70–99)
Glucose-Capillary: 247 mg/dL — ABNORMAL HIGH (ref 70–99)

## 2022-08-24 MED ORDER — ACETAMINOPHEN 500 MG PO TABS: 1000.0000 mg | ORAL_TABLET | Freq: Three times a day (TID) | ORAL | 0 refills | Status: AC

## 2022-08-24 MED ORDER — OXYCODONE HCL 10 MG PO TABS: 10.0000 mg | ORAL_TABLET | ORAL | 0 refills | Status: DC | PRN

## 2022-08-24 MED ORDER — DOCUSATE SODIUM 100 MG PO CAPS: 100.0000 mg | ORAL_CAPSULE | Freq: Two times a day (BID) | ORAL | 0 refills | Status: DC

## 2022-08-24 MED ORDER — SENNA 8.6 MG PO TABS: 2.0000 | ORAL_TABLET | Freq: Every day | ORAL | 0 refills | Status: AC

## 2022-08-24 MED ORDER — METHOCARBAMOL 1000 MG PO TABS: 1000.0000 mg | ORAL_TABLET | Freq: Four times a day (QID) | ORAL | 0 refills | Status: DC | PRN

## 2022-08-24 MED ORDER — METHOCARBAMOL 500 MG PO TABS
1000.0000 mg | ORAL_TABLET | Freq: Four times a day (QID) | ORAL | 0 refills | Status: DC | PRN
  Filled 2022-08-24: qty 60, 8d supply, fill #0

## 2022-08-24 MED ORDER — DOCUSATE SODIUM 100 MG PO CAPS: 100.0000 mg | ORAL_CAPSULE | Freq: Two times a day (BID) | ORAL | 0 refills | Status: AC

## 2022-08-24 MED ORDER — OXYCODONE HCL 10 MG PO TABS
10.0000 mg | ORAL_TABLET | ORAL | 0 refills | Status: AC | PRN
  Filled 2022-08-24: qty 20, 4d supply, fill #0

## 2022-08-24 MED ORDER — POLYETHYLENE GLYCOL 3350 17 G PO PACK: 17.0000 g | PACK | Freq: Every day | ORAL | 0 refills | Status: AC

## 2022-08-24 NOTE — Care Management Important Message (Signed)
Important Message  Patient Details  Name: David Harrell MRN: [id number] Date of Birth: 09-23-1960   Medicare Important Message Given:  Yes     Sherilyn Banker 08/24/2022, 1:59 PM

## 2022-08-24 NOTE — TOC Transition Note (Signed)
Transition of Care (TOC) - CM/SW Discharge Note Donn Pierini RN,BSN Transitions of Care Unit 4NP (Non Trauma)- RN Case Manager See Treatment Team for direct Phone #   Patient Details  Name: KOLBY GARRON MRN: [id number] Date of Birth: 08-10-1960  Transition of Care [facility]) CM/SW Contact:  Darrold Span, RN Phone Number: 08/24/2022, 12:02 PM   Clinical Narrative:    Per MD note pt likely home this afternoon,  CM at bedside to follow up with pt regarding DME need RW vs Rollator.   Pt in bathroom currently sister at bedside and confirmed pt wants walker with seat, has RW at home. PT also stopped by and confirmed DME need for rollator.  Pt has [facility] HMO plan which is contracted with [employer] for DME needs- will contact them to have rollator provided to pt prior to discharge this afternoon.   Call made to [employer] liaison for DME need- rollator to be delivered to room prior to discharge.   HH set up w/ [facility]- liaison aware of discharge for start of care.   No further TOC needs noted   Final next level of care: Home w Home Health Services Barriers to Discharge: No Barriers Identified   Patient Goals and CMS Choice CMS Medicare.gov Compare Post Acute Care list provided to:: Patient Choice offered to / list presented to : Patient  Discharge Placement                   Home w/ [facility]      Discharge Plan and Services Additional resources added to the After Visit Summary for     Discharge Planning Services: CM Consult Post Acute Care Choice: Home Health          DME Arranged: Walker rolling with seat DME Agency: [employer] Date DME Agency Contacted: 08/24/22 Time DME Agency Contacted: 1115 Representative spoke with at DME Agency: Earna Coder HH Arranged: PT, OT HH Agency: [facility] Date [facility] Agency Contacted: 08/23/22 Time HH Agency Contacted: 1154 Representative spoke with at [facility] Agency: Kandee Keen  Social Determinants of Health (SDOH) Interventions SDOH  Screenings   Food Insecurity: No Food Insecurity (08/19/2022)  Housing: Low Risk  (08/19/2022)  Transportation Needs: No Transportation Needs (08/19/2022)  Utilities: Not At Risk (08/19/2022)  Depression (PHQ2-9): Low Risk  (07/16/2022)  Tobacco Use: High Risk (08/19/2022)     Readmission Risk Interventions    08/24/2022   12:02 PM 08/23/2022   11:54 AM  Readmission Risk Prevention Plan  Post Dischage Appt Complete   Medication Screening Complete   Transportation Screening Complete Complete  Home Care Screening  Complete  Medication Review (RN CM)  Complete

## 2022-08-24 NOTE — TOC CM/SW Note (Signed)
   Durable Medical Equipment (From admission, onward)        Start     Ordered  08/24/22 1112  For home use only DME 4 wheeled rolling walker with seat  Once      Question:  Patient needs a walker to treat with the following condition  Answer:  Physical deconditioning  08/24/22 1111

## 2022-08-24 NOTE — Progress Notes (Signed)
Delivered patient's TOC medications. Patient's RN completed discharge to include explained discharge instructions and removal of PIV and telemetry monitoring. I transported downstairs for discharge home.

## 2022-08-24 NOTE — Discharge Instructions (Addendum)
[facility] [place] [facility] DISCHARGE INSTRUCTIONS: DISTAL PANCREATECTOMY AND SPLENECTOMY  Activity No heavy lifting greater than 15 pounds for 8 weeks after surgery. Ok to shower, but do not bathe or submerge incision underwater. Do not drive while taking narcotic pain medication. Be sure to walk around your home at least 3 times daily. This will help avoid blood clots and will improve your strength.  Wound Care Your incision is covered with skin glue called Dermabond. This will peel off on its own over time. You may shower and allow warm soapy water to run over your incisions. Gently pat dry. Do not submerge your incision underwater. Monitor your incision for any new redness, tenderness, or drainage. Your former drain site will close over in 2-3 days. Until then, you may keep it covered with a bandaid or gauze dressing. It is normal to have a small amount of clear yellow-red drainage until this closes.  Medications You should take Tylenol 4 times daily for pain. You should continue taking your MS Contin as you normally do. You have been prescribed extra oxycodone for surgical breakthrough pain. For constipation: Take colace 100mg  twice daily, and Miralax 17g (1 capful daily) to help prevent constipation. You should also be sure to drink plenty of water.  Diet You may consume regular food as tolerated. You may notice that you sometimes feel full early after meals. This is common after the surgery you had, and will improve with time. Avoid drinking a lot of carbonated beverages, as these can worsen symptoms of nausea and reflux. If you are having frequent vomiting after meals or are unable to keep down any food or drink, please call the office right away.  Spleen Vaccines Your spleen was removed, and you have been given vaccines to prevent certain bacterial infections that can occur after spleen removal. You will need booster doses of the meningitis vaccine in 2 months.  When to Call  us: Fever greater than 100.5 New redness, drainage, or swelling at incision site Severe pain, nausea, or vomiting Excessive vomiting or inability to keep down any liquids Jaundice (yellowing of the whites of the eyes or skin)  Follow-up You have an appointment scheduled with Dr. Freida Busman on September 14, 2022 at 9:20am. This will be at the [facility] office at [address], [place], [place]. Please arrive at least 15 minutes prior to your scheduled appointment time. You should also follow up with your primary care doctor within 2 weeks to have your blood sugars checked.  For questions or concerns, please call the office at [phone].

## 2022-08-24 NOTE — Progress Notes (Signed)
Physical Therapy Treatment Patient Details Name: David Harrell MRN: [id number] DOB: 12-28-1960 Today's Date: 08/24/2022   History of Present Illness Patient is a 62 y/o male admitted 08/18/22 for open distal pancreatectomy with splenectomy and liver biopsy due to pancreatic adenocarcinoma.  PMH positive for tobacco use, multiple back surgeries, GERD, HTN and neck surgery.    PT Comments    Pt mobilized in room at supervision level with RW. Pt initially states that he does not need RW but when discussing pain, pt is at 7/10 and reports RW does help decreased this. Encouraged to use RW for mobility at home until pain decreases. Recommend rollator for home to allow for sitting when needed. Ambulation limited today but frequency of BM that has been occurring since yesterday evening. Pt mod I with bed mobility and transfers. Discussed frequency and duration of mobility once he's home. Ready for d/c from a mobility standpoint.    Recommendations for follow up therapy are one component of a multi-disciplinary discharge planning process, led by the attending physician.  Recommendations may be updated based on patient status, additional functional criteria and insurance authorization.  Follow Up Recommendations       Assistance Recommended at Discharge Set up Supervision/Assistance  Patient can return home with the following A little help with walking and/or transfers;Assistance with cooking/housework;Assist for transportation;Help with stairs or ramp for entrance   Equipment Recommendations  Rollator (4 wheels)    Recommendations for Other Services       Precautions / Restrictions Precautions Precautions: Fall;Other (comment) Precaution Comments: abd protection precs, JP drain Restrictions Weight Bearing Restrictions: No     Mobility  Bed Mobility Overal bed mobility: Modified Independent             General bed mobility comments: pt coming to EOB without assist from bed flat     Transfers Overall transfer level: Modified independent Equipment used: Rolling walker (2 wheels) Transfers: Sit to/from Stand Sit to Stand: Modified independent (Device/Increase time)           General transfer comment: pt stands safely to RW from bed and chair and toilet    Ambulation/Gait Ambulation/Gait assistance: Supervision Gait Distance (Feet): 50 Feet Assistive device: Rolling walker (2 wheels) Gait Pattern/deviations: Step-through pattern, Decreased stride length Gait velocity: decreased Gait velocity interpretation: <1.8 ft/sec, indicate of risk for recurrent falls   General Gait Details: limited ambulation to within room laps due to bowel frequency. Pt reports he does not need RW but then when questioned if it helps control pain he said that it does so encouraged to use as long as pain is >5/10. Pt mobilizing safely at supervision level with RW   Stairs             Wheelchair Mobility    Modified Rankin (Stroke Patients Only)       Balance Overall balance assessment: Needs assistance Sitting-balance support: No upper extremity supported Sitting balance-Leahy Scale: Good     Standing balance support: No upper extremity supported Standing balance-Leahy Scale: Good Standing balance comment: able to stand without UE support for short periods                            Cognition Arousal/Alertness: Awake/alert Behavior During Therapy: Flat affect Overall Cognitive Status: Impaired/Different from baseline Area of Impairment: Memory                     Memory: Decreased short-term  memory         General Comments: STM deficits noted        Exercises      General Comments General comments (skin integrity, edema, etc.): DIscussed desire for rollator with pt's sister. Educated pt on frequency and duration of activity upon return home. VSS      Pertinent Vitals/Pain Pain Assessment Pain Assessment: 0-10 Pain Score: 7   Pain Location: abdomen Pain Descriptors / Indicators: Operative site guarding, Sore Pain Intervention(s): Patient requesting pain meds-RN notified, Limited activity within patient's tolerance, Monitored during session    Home Living                          Prior Function            PT Goals (current goals can now be found in the care plan section) Acute Rehab PT Goals Patient Stated Goal: to return home with family support PT Goal Formulation: With patient Time For Goal Achievement: 09/02/22 Potential to Achieve Goals: Good Progress towards PT goals: Progressing toward goals    Frequency    Min 3X/week      PT Plan Current plan remains appropriate    Co-evaluation              AM-PAC PT "6 Clicks" Mobility   Outcome Measure  Help needed turning from your back to your side while in a flat bed without using bedrails?: None Help needed moving from lying on your back to sitting on the side of a flat bed without using bedrails?: None Help needed moving to and from a bed to a chair (including a wheelchair)?: None Help needed standing up from a chair using your arms (e.g., wheelchair or bedside chair)?: None Help needed to walk in hospital room?: A Little Help needed climbing 3-5 steps with a railing? : A Little 6 Click Score: 22    End of Session   Activity Tolerance: Other (comment) (limited by BM frequency) Patient left: Other (comment);with family/visitor present (in bathroom) Nurse Communication: Mobility status;Patient requests pain meds PT Visit Diagnosis: Other abnormalities of gait and mobility (R26.89);Muscle weakness (generalized) (M62.81)     Time: 1046-1100 PT Time Calculation (min) (ACUTE ONLY): 14 min  Charges:  $Gait Training: 8-22 mins                     Lyanne Co, PT  Acute Rehab Services Secure chat preferred Office [phone]    David Harrell 08/24/2022, 11:17 AM

## 2022-08-24 NOTE — Plan of Care (Signed)
Discharging home with walker

## 2022-08-24 NOTE — Inpatient Diabetes Management (Signed)
Inpatient Diabetes Program Recommendations  AACE/ADA: New Consensus Statement on Inpatient Glycemic Control (2015)  Target Ranges:  Prepandial:   less than 140 mg/dL      Peak postprandial:   less than 180 mg/dL (1-2 hours)      Critically ill patients:  140 - 180 mg/dL   Lab Results  Component Value Date   GLUCAP 145 (H) 08/24/2022   HGBA1C 6.5 (H) 08/19/2022    Review of Glycemic Control  Diabetes history: None - pancreatic ca Outpatient Diabetes medications: None Current orders for Inpatient glycemic control: Novolog 0-9 units TID with meals and 0-5 HS  Inpatient Diabetes Program Recommendations:    Gave Libre CGM samples for home.   Encouraged pt to purchase glucose meter at [facility] for $14 and check blood sugars 2-3x/day. Discussed blood sugar goals and pt states he will make f/u appt with PCP for diabetes management.   Pt appreciative of visit.   Thank you. Ailene Ards, RD, LDN, CDCES Inpatient Diabetes Coordinator [phone]

## 2022-08-24 NOTE — Progress Notes (Signed)
Patient had some cramping pain overnight with bowel movements but is otherwise doing well with good pain control. Tolerating diet. Ambulated with PT. POD4 drain amylase was low at 15, drain was removed at bedside this morning. Plan for discharge home this afternoon.

## 2022-08-25 NOTE — Discharge Summary (Signed)
Physician Discharge Summary   Patient ID: David Harrell [id number] 61 y.o. 06/02/1960  Admit date: 08/18/2022  Discharge date and time: 08/24/2022  1:36 PM   Admitting Physician: Fritzi Mandes, MD   Discharge Physician: Sophronia Simas, MD  Admission Diagnoses: Pancreatic adenocarcinoma [facility]) [C25.9]  Discharge Diagnoses: Pancreatic adenocarcinoma  Admission Condition: stable  Discharged Condition: stable  Indication for Admission: David Harrell is a 62 yo male who presented with worsening LUQ abdominal pain and was found to have a mass in the tail of the pancreas. EUS with FNA confirmed adenocarcinoma. After a discussion of the risks and benefits of surgery, he agreed to proceed with distal pancreatectomy and splenectomy. Please see separately dictated H&P for further details.  Hospital Course: The patient was taken to the OR on 08/18/22 for a staging laparoscopy and open distal pancreatectomy with splenectomy. Please see separately dictated operative note for further details. Postoperatively he was admitted to the progressive care unit in stable condition. NG and foley were removed on POD1. He had an epidural and PCA for pain control. His diet was slowly advanced over the next few days from clear liquids to full liquids to a soft diet. Once he was tolerating solid food, his epidural was removed on POD4 and he was transitioned to oral pain medications. He worked with physical therapy and progressed to independent ambulation. He had return of bowel function. His drain amylase was low on POD4 and JP drain was removed on POD6. On the morning of POD6, he was tolerating a regular diet, ambulating, having bowel function, and pain was controlled on oral medications. He was examined and deemed appropriate for discharge home. His hgb A1c was 6.5 on admission and postop insulin requirements were minimal. He was instructed to follow up with his PCP for glucose monitoring. Post-splenectomy vaccines were given on  POD5.  Consults: Diabetes coordinator  Significant Diagnostic Studies: Labs: Hgb A1c 6.5  Surgical Pathology: A. LIVER, NODULE, BIOPSY:      Benign hepatic cyst with robust chronic inflammation.      Negative for malignancy.  B. PANCREATIC NECK MARGIN:      Pancreatic duct with high-grade pancreatic intraepithelial neoplasia (PanIN 3).      Pancreatic parenchyma without significant diagnostic.      No evidence of invasive carcinoma.  C. PANCREAS, TAIL AND SPLEEN, DISTAL PANCREATECTOMY AND SPLEENECTOMY:      Invasive pancreatic ductal adenocarcinoma, moderately differentiated.      Carcinoma arises in high-grade pancreatic intraepithelial neoplasia (PanIN 3).      Tumor size: 7.0 x 3.5 x 3.0 cm      Lymphovascular invasion is not identified.      Perineural invasion is not identified.      Eighteen lymph nodes, negative for metastatic carcinoma (0/18).      Benign splenic parenchyma, negative for malignancy.      See part D for final margin status.      See oncology table.  D. FINAL PANCREATIC NECK MARGIN:      Pancreatic branching ducts with high-grade pancreatic intraepithelial neoplasia (PanIN 3).      Pancreatic parenchyma without significant diagnostic.      No evidence of invasive carcinoma.  ONCOLOGY TABLE:  PANCREAS (EXOCRINE), CARCINOMA: Resection  Procedure: Distal pancreatectomy and splenectomy Tumor Site: Pancreas, tail Tumor Size: 7 x 3.5 x 3 cm Histologic Type: Invasive pancreatic ductal adenocarcinoma Histologic Grade: Moderately differentiated Tumor Extension: Confirmed to pancreatic parenchyma Treatment Effect: No known presurgical therapy Lymphovascular Invasion: Not identified  Perineural Invasion: Identified Margins: Margin Status for Invasive Carcinoma: All margins negative for invasive carcinoma Margin Status for Dysplasia and Intraepithelial Neoplasia: Pancreatic parenchymal margin positive for high-grade intraepithelial neoplasia Regional  Lymph Nodes:      Number of Lymph Nodes with Tumor: 0      Number of Lymph Nodes Examined: 18 Distant Metastasis:      Distant Site(s) Involved: Not applicable Pathologic Stage Classification (pTNM, AJCC 8th Edition): pT3, pN0   Treatments: analgesia: acetaminophen, Dilaudid, oxycodone, robaxin, MS Contin, therapies: PT and OT, and surgery: Staging laparoscopy, open distal pancreatectomy with splenectomy  Discharge Exam: General: resting comfortably, NAD Neuro: alert and oriented, no focal deficits Resp: normal work of breathing on room air Abdomen: soft, nondistended, nontender to palpation. Upper midline incision clean and dry. Drain site clean. Extremities: warm and well-perfused   Disposition: Discharge disposition: 06-Home-Health Care Svc       Patient Instructions:  Allergies as of 08/24/2022       Reactions   Aspirin    Acid reflux    Pregabalin    Dizziness   Tape    Blisters skin, paper tape is ok   Codeine Palpitations   headaches   Oxymorphone Rash   Penicillins Hives, Rash   Vicodin [hydrocodone-acetaminophen] Palpitations   Headaches         Medication List     STOP taking these medications    apixaban 5 MG Tabs tablet Commonly known as: Eliquis       TAKE these medications    acetaminophen 500 MG tablet Commonly known as: TYLENOL Take 2 tablets (1,000 mg total) by mouth 3 (three) times daily.   ALPRAZolam 1 MG tablet Commonly known as: XANAX Take 1 mg by mouth 2 (two) times daily.   budesonide-formoterol 160-4.5 MCG/ACT inhaler Commonly known as: SYMBICORT Inhale 2 puffs into the lungs 2 (two) times daily as needed (shortness of breath).   docusate sodium 100 MG capsule Commonly known as: COLACE Take 1 capsule (100 mg total) by mouth 2 (two) times daily for 14 days.   lisinopril 20 MG tablet Commonly known as: ZESTRIL Take 20 mg by mouth daily.   meloxicam 15 MG tablet Commonly known as: MOBIC Take 15 mg by mouth daily.    methocarbamol 500 MG tablet Commonly known as: ROBAXIN Take 2 tablets (1,000 mg total) by mouth every 6 (six) hours as needed.   morphine 30 MG 12 hr tablet Commonly known as: MS CONTIN Take 1 tablet (30 mg total) by mouth every 8 (eight) hours.   omeprazole 20 MG tablet Commonly known as: PRILOSEC OTC Take 20 mg by mouth daily.   ondansetron 4 MG tablet Commonly known as: ZOFRAN Take 1 tablet (4 mg total) by mouth every 6 (six) hours as needed for nausea.   Oxycodone HCl 10 MG Tabs Take 1 tablet (10 mg total) by mouth every 4 (four) hours as needed (breakthrough pain). What changed: reasons to take this   pantoprazole 40 MG tablet Commonly known as: PROTONIX Take 1 tablet (40 mg total) by mouth 2 (two) times daily.   polyethylene glycol 17 g packet Commonly known as: MIRALAX / GLYCOLAX Take 17 g by mouth daily.   rosuvastatin 20 MG tablet Commonly known as: CRESTOR Take 20 mg by mouth daily.   senna 8.6 MG Tabs tablet Commonly known as: SENOKOT Take 2 tablets (17.2 mg total) by mouth at bedtime.       Activity: no driving for 2 weeks and no  heavy lifting for 8 weeks Diet: diabetic diet Wound Care: keep wound clean and dry  Follow-up with Dr. Freida Busman on 09/14/22.  Signed: Fritzi Mandes 08/25/2022 8:06 PM

## 2022-08-31 ENCOUNTER — Telehealth: Payer: Medicare HMO | Admitting: Hospice and Palliative Medicine

## 2022-09-01 ENCOUNTER — Other Ambulatory Visit: Payer: Self-pay

## 2022-09-01 NOTE — Progress Notes (Signed)
The proposed treatment discussed in conference is for discussion purpose only and is not a binding recommendation.  The patients have not been physically examined, or presented with their treatment options.  Therefore, final treatment plans cannot be decided.  

## 2022-09-06 ENCOUNTER — Other Ambulatory Visit: Payer: Medicare HMO

## 2022-09-06 ENCOUNTER — Inpatient Hospital Stay: Payer: Medicare HMO | Admitting: Oncology

## 2022-09-06 ENCOUNTER — Inpatient Hospital Stay: Payer: Medicare HMO | Attending: Oncology | Admitting: Hospice and Palliative Medicine

## 2022-09-06 DIAGNOSIS — F17210 Nicotine dependence, cigarettes, uncomplicated: Secondary | ICD-10-CM | POA: Insufficient documentation

## 2022-09-06 DIAGNOSIS — Z5111 Encounter for antineoplastic chemotherapy: Secondary | ICD-10-CM | POA: Insufficient documentation

## 2022-09-06 DIAGNOSIS — C252 Malignant neoplasm of tail of pancreas: Secondary | ICD-10-CM | POA: Insufficient documentation

## 2022-09-06 DIAGNOSIS — G8929 Other chronic pain: Secondary | ICD-10-CM | POA: Insufficient documentation

## 2022-09-06 DIAGNOSIS — Z808 Family history of malignant neoplasm of other organs or systems: Secondary | ICD-10-CM | POA: Insufficient documentation

## 2022-09-16 ENCOUNTER — Other Ambulatory Visit: Payer: Self-pay | Admitting: *Deleted

## 2022-09-16 ENCOUNTER — Inpatient Hospital Stay: Payer: Medicare HMO

## 2022-09-16 ENCOUNTER — Ambulatory Visit
Admission: RE | Admit: 2022-09-16 | Discharge: 2022-09-16 | Disposition: A | Discharge status: Home / Self Care | Payer: Medicare HMO | Source: Ambulatory Visit | Attending: Radiation Oncology | Admitting: Radiation Oncology

## 2022-09-16 ENCOUNTER — Inpatient Hospital Stay (HOSPITAL_BASED_OUTPATIENT_CLINIC_OR_DEPARTMENT_OTHER): Payer: Medicare HMO | Admitting: Oncology

## 2022-09-16 ENCOUNTER — Encounter: Payer: Self-pay | Admitting: Radiation Oncology

## 2022-09-16 ENCOUNTER — Ambulatory Visit: Admission: RE | Admit: 2022-09-16 | Payer: Medicare HMO | Source: Ambulatory Visit | Admitting: Radiation Oncology

## 2022-09-16 ENCOUNTER — Encounter: Payer: Self-pay | Admitting: Oncology

## 2022-09-16 VITALS — BP 130/80 | HR 78 | Temp 96.7°F | Ht 68.0 in | Wt 144.8 lb

## 2022-09-16 DIAGNOSIS — C259 Malignant neoplasm of pancreas, unspecified: Secondary | ICD-10-CM

## 2022-09-16 DIAGNOSIS — F17210 Nicotine dependence, cigarettes, uncomplicated: Secondary | ICD-10-CM | POA: Diagnosis not present

## 2022-09-16 DIAGNOSIS — Z808 Family history of malignant neoplasm of other organs or systems: Secondary | ICD-10-CM | POA: Diagnosis not present

## 2022-09-16 DIAGNOSIS — G8929 Other chronic pain: Secondary | ICD-10-CM | POA: Diagnosis not present

## 2022-09-16 DIAGNOSIS — Z5111 Encounter for antineoplastic chemotherapy: Secondary | ICD-10-CM | POA: Diagnosis not present

## 2022-09-16 DIAGNOSIS — C252 Malignant neoplasm of tail of pancreas: Secondary | ICD-10-CM | POA: Diagnosis not present

## 2022-09-16 LAB — CMP (CANCER CENTER ONLY)
ALT: 24 U/L (ref 0–44)
AST: 30 U/L (ref 15–41)
Albumin: 4.2 g/dL (ref 3.5–5.0)
Alkaline Phosphatase: 77 U/L (ref 38–126)
Anion gap: 10 (ref 5–15)
BUN: 13 mg/dL (ref 6–20)
CO2: 28 mmol/L (ref 22–32)
Calcium: 9 mg/dL (ref 8.9–10.3)
Chloride: 101 mmol/L (ref 98–111)
Creatinine: 0.58 mg/dL — ABNORMAL LOW (ref 0.61–1.24)
GFR, Estimated: >60 mL/min (ref >60)
Glucose, Bld: 119 mg/dL — ABNORMAL HIGH (ref 70–99)
Potassium: 4.2 mmol/L (ref 3.5–5.1)
Sodium: 139 mmol/L (ref 135–145)
Total Bilirubin: 0.3 mg/dL (ref 0.3–1.2)
Total Protein: 8 g/dL (ref 6.5–8.1)

## 2022-09-16 LAB — CBC WITH DIFFERENTIAL/PLATELET
Abs Immature Granulocytes: 0.04 10*3/uL (ref 0.00–0.07)
Basophils Absolute: 0.2 10*3/uL — ABNORMAL HIGH (ref 0.0–0.1)
Basophils Relative: 2 %
Eosinophils Absolute: 0.8 10*3/uL — ABNORMAL HIGH (ref 0.0–0.5)
Eosinophils Relative: 8 %
HCT: 38.3 % — ABNORMAL LOW (ref 39.0–52.0)
Hemoglobin: 12.1 g/dL — ABNORMAL LOW (ref 13.0–17.0)
Immature Granulocytes: 0 %
Lymphocytes Relative: 30 %
Lymphs Abs: 2.8 10*3/uL (ref 0.7–4.0)
MCH: 28.8 pg (ref 26.0–34.0)
MCHC: 31.6 g/dL (ref 30.0–36.0)
MCV: 91.2 fL (ref 80.0–100.0)
Monocytes Absolute: 0.9 10*3/uL (ref 0.1–1.0)
Monocytes Relative: 10 %
Neutro Abs: 4.8 10*3/uL (ref 1.7–7.7)
Neutrophils Relative %: 50 %
Platelets: 500 10*3/uL — ABNORMAL HIGH (ref 150–400)
RBC: 4.2 MIL/uL — ABNORMAL LOW (ref 4.22–5.81)
RDW: 14.7 % (ref 11.5–15.5)
WBC: 9.5 10*3/uL (ref 4.0–10.5)
nRBC: 0 % (ref 0.0–0.2)

## 2022-09-16 NOTE — Progress Notes (Signed)
[facility]  [phone] [phone]) [phone]  ID: David Harrell OB: [birth year]  MR#: [id number]  XLK#:[id number]  Patient Care Team: Gracelyn Nurse, MD as PCP - General (Internal Medicine) Benita Gutter, RN as Oncology Nurse Navigator  CHIEF COMPLAINT: Pathologic stage IIa adenocarcinoma the pancreas.  INTERVAL HISTORY: Patient returns to clinic today postoperatively to discuss his final pathology results and adjuvant chemotherapy treatment planning.  He continues to have mild incisional tenderness, but otherwise feels well.  He tolerated surgery without significant side effects.  He has no neurologic complaints.  He denies any recent fevers or illnesses.  He has a good appetite and denies weight loss.  He has no chest pain, shortness of breath, cough, or hemoptysis.  He denies any nausea, vomiting, constipation, or diarrhea.  He has no urinary complaints.  Patient offers no further specific complaints today.    REVIEW OF SYSTEMS:   Review of Systems  Constitutional: Negative.  Negative for fever, malaise/fatigue and weight loss.  Respiratory: Negative.  Negative for cough, hemoptysis and shortness of breath.   Cardiovascular: Negative.  Negative for chest pain and leg swelling.  Gastrointestinal: Negative.  Negative for abdominal pain.  Genitourinary: Negative.  Negative for dysuria.  Musculoskeletal: Negative.  Negative for back pain.  Skin: Negative.  Negative for rash.  Neurological: Negative.  Negative for dizziness, focal weakness, weakness and headaches.  Psychiatric/Behavioral: Negative.  The patient is not nervous/anxious.     As per HPI. Otherwise, a complete review of systems is negative.  PAST MEDICAL HISTORY: Past Medical History:  Diagnosis Date   Arthritis    GERD (gastroesophageal reflux disease)    Hiatal hernia    History of kidney stones    Hypertension    Pancreatic cancer (HCC) 2024    PAST SURGICAL HISTORY: Past  Surgical History:  Procedure Laterality Date   ANTERIOR CERVICAL DECOMP/DISCECTOMY FUSION     BACK SURGERY     x5   ESOPHAGOGASTRODUODENOSCOPY N/A 07/12/2022   Procedure: ESOPHAGOGASTRODUODENOSCOPY (EGD);  Surgeon: Lemar Lofty., MD;  Location: [facility] END[facility]COPY;  Service: Gastroenterology;  Laterality: N/A;   ESOPHAGOGASTRODUODENOSCOPY (EGD) WITH PROPOFOL N/A 07/05/2022   Procedure: ESOPHAGOGASTRODUODENOSCOPY (EGD) WITH PROPOFOL;  Surgeon: [name], DO;  Location: [facility] ENDOSCOPY;  Service: Gastroenterology;  Laterality: N/A;   EUS N/A 07/12/2022   Procedure: UPPER ENDOSCOPIC ULTRASOUND (EUS) RADIAL;  Surgeon: Lemar Lofty., MD;  Location: [facility] ENDOSCOPY;  Service: Gastroenterology;  Laterality: N/A;   FINE NEEDLE ASPIRATION  07/12/2022   Procedure: FINE NEEDLE ASPIRATION;  Surgeon: Meridee Score Netty Starring., MD;  Location: [facility]OS[facility];  Service: Gastroenterology;;   LAPAROSCOPY N/A 08/18/2022   Procedure: STAGING LAPAROSCOPY;  Surgeon: Fritzi Mandes, MD;  Location[facility];  Service: General;  Laterality: N/A;   OPERATIVE ULTRASOUND N/A 08/18/2022   Procedure: INTRAOPERATIVE ULTRASOUND;  Surgeon: Fritzi Mandes, MD;  Location: [facility] OR;  Service: General;  Laterality: N/A;   SHOULDER ARTHROSCOPY Bilateral    possibly metal in right shoulder, pt unsure   SPLENECTOMY, TOTAL N/A 08/18/2022   Procedure: SPLENECTOMY;  Surgeon: Fritzi Mandes, MD;  Location: [facility] OR;  Service: General;  Laterality: N/A;    FAMILY HISTORY: Family History  Problem Relation Age of Onset   Liver cancer Sister    Spina bifida Paternal Grandfather     ADVANCED DIRECTIVES (Y/N):  N  HEALTH MAINTENANCE: Social History   Tobacco Use   Smoking status: Every Day    Current packs/day: 0.50  Average packs/day: 0.5 packs/day for 40.0 years (20.0 ttl pk-yrs)    Types: Cigarettes   Smokeless tobacco: Never  Vaping Use   Vaping status: Never Used  Substance Use Topics   Alcohol use: Not  Currently   Drug use: Yes    Frequency: 1.0 times per week    Types: Marijuana     Colonoscopy:  PAP:  Bone density:  Lipid panel:  Allergies  Allergen Reactions   Aspirin     Acid reflux     Pregabalin     Dizziness   Tape     Blisters skin, paper tape is ok   Codeine Palpitations    headaches   Oxymorphone Rash   Penicillins Hives and Rash   Vicodin [Hydrocodone-Acetaminophen] Palpitations    Headaches     Current Outpatient Medications  Medication Sig Dispense Refill   acetaminophen (TYLENOL) 500 MG tablet Take 2 tablets (1,000 mg total) by mouth 3 (three) times daily. 30 tablet 0   ALPRAZolam (XANAX) 1 MG tablet Take 1 mg by mouth 2 (two) times daily.     budesonide-formoterol (SYMBICORT) 160-4.5 MCG/ACT inhaler Inhale 2 puffs into the lungs 2 (two) times daily as needed (shortness of breath).     lisinopril (ZESTRIL) 20 MG tablet Take 20 mg by mouth daily.     meloxicam (MOBIC) 15 MG tablet Take 15 mg by mouth daily.     methocarbamol (ROBAXIN) 500 MG tablet Take 2 tablets (1,000 mg total) by mouth every 6 (six) hours as needed. 60 tablet 0   morphine (MS CONTIN) 30 MG 12 hr tablet Take 1 tablet (30 mg total) by mouth every 8 (eight) hours. 90 tablet 0   omeprazole (PRILOSEC OTC) 20 MG tablet Take 20 mg by mouth daily.     ondansetron (ZOFRAN) 4 MG tablet Take 1 tablet (4 mg total) by mouth every 6 (six) hours as needed for nausea. 20 tablet 0   Oxycodone HCl 10 MG TABS Take 1 tablet (10 mg total) by mouth every 4 (four) hours as needed (breakthrough pain). 20 tablet 0   pantoprazole (PROTONIX) 40 MG tablet Take 1 tablet (40 mg total) by mouth 2 (two) times daily. 60 tablet 0   polyethylene glycol (MIRALAX / GLYCOLAX) 17 g packet Take 17 g by mouth daily. 14 each 0   rosuvastatin (CRESTOR) 20 MG tablet Take 20 mg by mouth daily.     senna (SENOKOT) 8.6 MG TABS tablet Take 2 tablets (17.2 mg total) by mouth at bedtime. 120 tablet 0   No current facility-administered  medications for this visit.    OBJECTIVE: Vitals:   09/16/22 1048  BP: 130/80  Pulse: 78  Temp: (!) 96.7 F (35.9 C)  SpO2: 97%     Body mass index is 22.02 kg/m.    ECOG FS:1 - Symptomatic but completely ambulatory  General: Well-developed, well-nourished, no acute distress. Eyes: Pink conjunctiva, anicteric sclera. HEENT: Normocephalic, moist mucous membranes. Lungs: No audible wheezing or coughing. Heart: Regular rate and rhythm. Abdomen: Soft, nontender, no obvious distention.  Well-healing surgical scar. Musculoskeletal: No edema, cyanosis, or clubbing. Neuro: Alert, answering all questions appropriately. Cranial nerves grossly intact. Skin: No rashes or petechiae noted. Psych: Normal affect.   LAB RESULTS:  Lab Results  Component Value Date   NA 136 08/21/2022   K 3.6 08/21/2022   CL 99 08/21/2022   CO2 28 08/21/2022   GLUCOSE 117 (H) 08/21/2022   BUN 5 (L) 08/21/2022   CREATININE 0.57 (  L) 08/21/2022   CALCIUM 8.2 (L) 08/21/2022   PROT 7.9 07/16/2022   ALBUMIN 4.4 07/16/2022   AST 17 07/16/2022   ALT 15 07/16/2022   ALKPHOS 57 07/16/2022   BILITOT 0.5 07/16/2022   GFRNONAA >60 08/21/2022   GFRAA >60 04/13/2018    Lab Results  Component Value Date   WBC 17.8 (H) 08/21/2022   NEUTROABS 4.5 07/16/2022   HGB 9.5 (L) 08/21/2022   HCT 28.6 (L) 08/21/2022   MCV 86.9 08/21/2022   PLT 275 08/21/2022     STUDIES: No results found.  ASSESSMENT: Pathologic stage IIa adenocarcinoma the pancreas.  PLAN:    Pathologic stage IIa adenocarcinoma the pancreas: Patient underwent surgical resection on August 18, 2022 confirming stage of disease.  Previously, PET scan did not reveal any metastatic disease.  Preoperatively patient's CA 19-9 was 130, currently within normal limits at 34.  Recommendation was to pursue adjuvant chemotherapy using gemcitabine and capecitabine.  Patient will receive weekly gemcitabine on days 1, 8, and 15 with oral capecitabine days 1  through 21.  Patient will have a 7-day break.  Plan on 4-6 cycles.  Return to clinic in approximately 2 weeks to initiate cycle 1.  Appreciate clinical pharmacy input.   Pain: Patient has been on chronic narcotics for greater than 20 years.  Currently on MS Contin with as needed oxycodone.  Patient was given a refill for his MS Contin today.  I spent a total of 30 minutes reviewing chart data, face-to-face evaluation with the patient, counseling and coordination of care as detailed above.    Patient expressed understanding and was in agreement with this plan. He also understands that He can call clinic at any time with any questions, concerns, or complaints.    Cancer Staging  Pancreatic adenocarcinoma [facility]) Staging form: Exocrine Pancreas, AJCC 8th Edition - Clinical stage from 07/16/2022: Stage IIA (cT3, cN0, cM0) - Signed by Jeralyn Ruths, MD on 07/16/2022 Stage prefix: Initial diagnosis Total positive nodes: 0   Jeralyn Ruths, MD   09/16/2022 11:25 AM

## 2022-09-16 NOTE — Progress Notes (Signed)
Nutrition Follow-up:  Patient with newly diagnosed pancreatic adenocarcinoma.  Patient s/p open distal pancreatectomy and splenectomy on 08/18/22.    Met with patient and wife in clinic.  Patient reports that he is able to eat but small amounts at the time because he gets full quickly.  Likes ensure shakes but unable to afford them.      Medications: reviewed  Labs: reviewed  Anthropometrics:   Weight 144 lb 12.8 oz today  143 lb on 5/30 prior to surgery  UBW of 170-175 lb Jan/Feb 2024   NUTRITION DIAGNOSIS: Inadequate oral intake continues    INTERVENTION:  Patient provided with one complimentary case of ensure due to one or more of the following reasons:  malnutrition, weight loss/maintenance, poor appetite AND food insecurity or financial hardship.   Encouraged small frequent meals/snacks to help with full feeling    MONITORING, EVALUATION, GOAL: weight trends, intake   NEXT VISIT: to be determined with treatment  David Harrell B. Freida Busman, RD, LDN Registered Dietitian [phone]

## 2022-09-16 NOTE — Progress Notes (Signed)
Needs refill morphine.  Dr. Freida Busman, surgeon, told pt our nutritionist could give him a case of ensure chocolate.

## 2022-09-17 ENCOUNTER — Telehealth: Payer: Self-pay | Admitting: Pharmacist

## 2022-09-17 ENCOUNTER — Telehealth: Payer: Self-pay

## 2022-09-17 ENCOUNTER — Other Ambulatory Visit (HOSPITAL_COMMUNITY): Payer: Self-pay

## 2022-09-17 ENCOUNTER — Encounter: Payer: Self-pay | Admitting: Oncology

## 2022-09-17 ENCOUNTER — Other Ambulatory Visit: Payer: Self-pay | Admitting: *Deleted

## 2022-09-17 DIAGNOSIS — C259 Malignant neoplasm of pancreas, unspecified: Secondary | ICD-10-CM

## 2022-09-17 LAB — CANCER ANTIGEN 19-9: CA 19-9: 34 U/mL (ref 0–35)

## 2022-09-17 MED ORDER — ONDANSETRON HCL 8 MG PO TABS
8.0000 mg | ORAL_TABLET | Freq: Three times a day (TID) | ORAL | 1 refills | Status: AC | PRN
Start: 2022-09-17 — End: ?

## 2022-09-17 MED ORDER — MORPHINE SULFATE ER 30 MG PO TBCR
30.0000 mg | EXTENDED_RELEASE_TABLET | Freq: Three times a day (TID) | ORAL | 0 refills | Status: DC
Start: 2022-09-17 — End: 2022-10-15

## 2022-09-17 MED ORDER — LIDOCAINE-PRILOCAINE 2.5-2.5 % EX CREA
TOPICAL_CREAM | CUTANEOUS | 3 refills | Status: DC
Start: 2022-09-17 — End: 2023-10-17

## 2022-09-17 MED ORDER — PROCHLORPERAZINE MALEATE 10 MG PO TABS
10.0000 mg | ORAL_TABLET | Freq: Four times a day (QID) | ORAL | 2 refills | Status: DC | PRN
Start: 2022-09-17 — End: 2023-04-15

## 2022-09-17 NOTE — Progress Notes (Signed)
START ON PATHWAY REGIMEN - Pancreatic Adenocarcinoma     A cycle is every 28 days:     Capecitabine      Gemcitabine   **Always confirm dose/schedule in your pharmacy ordering system**  Patient Characteristics: Postoperative without Neoadjuvant Therapy, M0 (Pathologic Staging), Negative Margins (Includes Positive Lymph Nodes) Therapeutic Status: Postoperative without Neoadjuvant Therapy, M0 (Pathologic Staging) AJCC T Category: pT3 AJCC N Category: pN0 AJCC M Category: cM0 AJCC 8 Stage Grouping: IIA Intent of Therapy: Curative Intent, Discussed with Patient

## 2022-09-17 NOTE — Telephone Encounter (Signed)
Oral Oncology Patient Advocate Encounter  New authorization   Received notification that prior authorization for Capecitabine is required.   PA submitted on 09/17/22  Key [id number]  Status is pending     Ardeen Fillers, CPhT Oncology Pharmacy Patient Advocate  [facility]  [phone] (phone) [phone] (fax) 09/17/2022 2:53 PM

## 2022-09-17 NOTE — Telephone Encounter (Signed)
Oral Oncology Patient Advocate Encounter  Prior Authorization for Capecitabine has been approved.    PA# [id number]  Effective dates: 09/17/22 through 03/08/23  Patients co-pay is $0.00.    Ardeen Fillers, CPhT Oncology Pharmacy Patient Advocate  [facility]  [phone] (phone) [phone] (fax) 09/17/2022 3:33 PM

## 2022-09-17 NOTE — Telephone Encounter (Signed)
Clinical Pharmacist Practitioner Encounter   Received new prescription for Xeloda (capecitabine) for the adjuvant treatment of stage IIa pancreatic cancer in conjunction with gemcitabine, planned duration of 4-6 cycles.  CMP from 09/16/22 assessed, no relevant lab abnormalities. Prescription dose and frequency assessed.   Current medication list in Epic reviewed, one DDIs with capecitabine identified: Proton Pump Inhibitors (PPI) may diminish the therapeutic effect of capecitabine, varying information on the clinical impact. Recommend evaluating the need for a PPI/acid suppression. If acid suppression is needed, attempt switching to a H2 antagonist (eg, famotidine) if possible.  Evaluated chart and no patient barriers to medication adherence identified.   Oral Oncology Clinic will continue to follow for insurance authorization, copayment issues, initial counseling and start date.   Remi Haggard, PharmD, BCPS, BCOP, CPP Hematology/Oncology Clinical Pharmacist Practitioner /[facility] [phone]  09/17/2022 2:36 PM

## 2022-09-20 ENCOUNTER — Telehealth: Payer: Self-pay | Admitting: *Deleted

## 2022-09-20 NOTE — Telephone Encounter (Signed)
Call placed to patient to discuss details of appointment for port placement. Patient scheduled for port placement 7/24 at 8:30 am. Patient aware to arrive at Heart and Vascular entrance at 7:30 am. Patient verbalized understanding of appointment details and is in agreement with plan.

## 2022-09-21 ENCOUNTER — Inpatient Hospital Stay: Payer: Medicare HMO

## 2022-09-21 ENCOUNTER — Inpatient Hospital Stay: Payer: Medicare HMO | Admitting: Pharmacist

## 2022-09-21 ENCOUNTER — Other Ambulatory Visit (HOSPITAL_COMMUNITY): Payer: Self-pay

## 2022-09-21 ENCOUNTER — Other Ambulatory Visit: Payer: Self-pay

## 2022-09-21 MED ORDER — CAPECITABINE 500 MG PO TABS
1500.0000 mg | ORAL_TABLET | Freq: Two times a day (BID) | ORAL | 1 refills | Status: DC
Start: 2022-09-21 — End: 2022-11-19
  Filled 2022-09-21: qty 126, 21d supply, fill #0
  Filled 2022-09-28: qty 126, 28d supply, fill #0
  Filled 2022-10-25: qty 126, 28d supply, fill #1

## 2022-09-23 ENCOUNTER — Inpatient Hospital Stay: Payer: Medicare HMO

## 2022-09-23 ENCOUNTER — Inpatient Hospital Stay: Payer: Medicare HMO | Admitting: Pharmacist

## 2022-09-27 ENCOUNTER — Other Ambulatory Visit: Payer: Self-pay | Admitting: Radiology

## 2022-09-27 NOTE — H&P (Signed)
Chief Complaint: Patient was seen in consultation today for port-a-catheter placement   Referring Physician(s): Finnegan,Timothy J  Supervising Physician: {Supervising Physician:[id number]}  Patient Status: [facility] - Out-pt  History of Present Illness: David Harrell is a 62 y.o. male with a medical history significant for kidney stones, HTN, chronic back pain, pancreatitis and recently diagnosed pancreatic cancer. He presented to the ED in April 2024 with complaints of abdominal pain, nausea and weight loss. Work up was positive for a lesion in the pancreatic tail with pathology revealing pancreatic adenocarcinoma. He underwent open distal pancreatectomy with en bloc splenectomy. Staging work up was negative for metastases.   His oncology team is preparing him for adjuvant chemotherapy and will require durable venous access. Interventional Radiology has been asked to evaluate this patient for an image-guided port-a-catheter placement to facilitate his treatment goals.   Past Medical History:  Diagnosis Date   Arthritis    GERD (gastroesophageal reflux disease)    Hiatal hernia    History of kidney stones    Hypertension    Pancreatic cancer (HCC) 2024    Past Surgical History:  Procedure Laterality Date   ANTERIOR CERVICAL DECOMP/DISCECTOMY FUSION     BACK SURGERY     x5   ESOPHAGOGASTRODUODENOSCOPY N/A 07/12/2022   Procedure: ESOPHAGOGASTRODUODENOSCOPY (EGD);  Surgeon: Lemar Lofty., MD;  Location: [facility] END[facility]COPY;  Service: Gastroenterology;  Laterality: N/A;   ESOPHAGOGASTRODUODENOSCOPY (EGD) WITH PROPOFOL N/A 07/05/2022   Procedure: ESOPHAGOGASTRODUODENOSCOPY (EGD) WITH PROPOFOL;  Surgeon: [name], DO;  Location: [facility] ENDOSCOPY;  Service: Gastroenterology;  Laterality: N/A;   EUS N/A 07/12/2022   Procedure: UPPER ENDOSCOPIC ULTRASOUND (EUS) RADIAL;  Surgeon: Lemar Lofty., MD;  Location: [facility] ENDOSCOPY;  Service: Gastroenterology;  Laterality: N/A;    FINE NEEDLE ASPIRATION  07/12/2022   Procedure: FINE NEEDLE ASPIRATION;  Surgeon: Meridee Score Netty Starring., MD;  Location: [facility] ENDOSCOPY;  Service: Gastroenterology;;   LAPAROSCOPY N/A 08/18/2022   Procedure: STAGING LAPAROSCOPY;  Surgeon: Fritzi Mandes, MD;  Location: [facility] OR;  Service: General;  Laterality: N/A;   OPERATIVE ULTRASOUND N/A 08/18/2022   Procedure: INTRAOPERATIVE ULTRASOUND;  Surgeon: Fritzi Mandes, MD;  Location: [facility] OR;  Service: General;  Laterality: N/A;   SHOULDER ARTHROSCOPY Bilateral    possibly metal in right shoulder, pt unsure   SPLENECTOMY, TOTAL N/A 08/18/2022   Procedure: SPLENECTOMY;  Surgeon: Fritzi Mandes, MD;  Location: [facility] OR;  Service: General;  Laterality: N/A;    Allergies: Aspirin, Pregabalin, Tape, Codeine, Oxymorphone, Penicillins, and Vicodin [hydrocodone-acetaminophen]  Medications: Prior to Admission medications   Medication Sig Start Date End Date Taking? Authorizing Provider  acetaminophen (TYLENOL) 500 MG tablet Take 2 tablets (1,000 mg total) by mouth 3 (three) times daily. 08/24/22   Fritzi Mandes, MD  ALPRAZolam Prudy Feeler) 1 MG tablet Take 1 mg by mouth 2 (two) times daily.    [provider]  budesonide-formoterol (SYMBICORT) 160-4.5 MCG/ACT inhaler Inhale 2 puffs into the lungs 2 (two) times daily as needed (shortness of breath).    [provider]  capecitabine (XELODA) 500 MG tablet Take 3 tablets (1,500 mg total) by mouth 2 (two) times daily after a meal. Take for 21 days, then hold for 7 days. Repeat every 28 days. 09/21/22   Jeralyn Ruths, MD  lidocaine-prilocaine (EMLA) cream Apply to affected area once 09/17/22   Jeralyn Ruths, MD  lisinopril (ZESTRIL) 20 MG tablet Take 20 mg by mouth daily.    [provider]  meloxicam (  MOBIC) 15 MG tablet Take 15 mg by mouth daily.    [provider]  methocarbamol (ROBAXIN) 500 MG tablet Take 2 tablets (1,000 mg total) by mouth every 6 (six) hours as  needed. 08/24/22   Fritzi Mandes, MD  morphine (MS CONTIN) 30 MG 12 hr tablet Take 1 tablet (30 mg total) by mouth every 8 (eight) hours. 09/17/22   Jeralyn Ruths, MD  omeprazole (PRILOSEC OTC) 20 MG tablet Take 20 mg by mouth daily.    [provider]  ondansetron (ZOFRAN) 4 MG tablet Take 1 tablet (4 mg total) by mouth every 6 (six) hours as needed for nausea. 07/07/22   Sunnie Nielsen, DO  ondansetron (ZOFRAN) 8 MG tablet Take 1 tablet (8 mg total) by mouth every 8 (eight) hours as needed for nausea or vomiting. 09/17/22   Jeralyn Ruths, MD  Oxycodone HCl 10 MG TABS Take 1 tablet (10 mg total) by mouth every 4 (four) hours as needed (breakthrough pain). 08/24/22   Fritzi Mandes, MD  pantoprazole (PROTONIX) 40 MG tablet Take 1 tablet (40 mg total) by mouth 2 (two) times daily. 07/07/22   Sunnie Nielsen, DO  polyethylene glycol (MIRALAX / GLYCOLAX) 17 g packet Take 17 g by mouth daily. 08/25/22   Fritzi Mandes, MD  prochlorperazine (COMPAZINE) 10 MG tablet Take 1 tablet (10 mg total) by mouth every 6 (six) hours as needed for nausea or vomiting. 09/17/22   Jeralyn Ruths, MD  rosuvastatin (CRESTOR) 20 MG tablet Take 20 mg by mouth daily.    [provider]  senna (SENOKOT) 8.6 MG TABS tablet Take 2 tablets (17.2 mg total) by mouth at bedtime. 08/24/22   Fritzi Mandes, MD     Family History  Problem Relation Age of Onset   Liver cancer Sister    Spina bifida Paternal Grandfather     Social History   Socioeconomic History   Marital status: Married    Spouse name: Not on file   Number of children: 2   Years of education: Not on file   Highest education level: Not on file  Occupational History   Not on file  Tobacco Use   Smoking status: Every Day    Current packs/day: 0.50    Average packs/day: 0.5 packs/day for 40.0 years (20.0 ttl pk-yrs)    Types: Cigarettes   Smokeless tobacco: Never  Vaping Use   Vaping status: Never Used  Substance and  Sexual Activity   Alcohol use: Not Currently   Drug use: Yes    Frequency: 1.0 times per week    Types: Marijuana   Sexual activity: Not on file  Other Topics Concern   Not on file  Social History Narrative   Not on file   Social Determinants of Health   Financial Resource Strain: Not on file  Food Insecurity: No Food Insecurity (08/19/2022)   Hunger Vital Sign    Worried About Running Out of Food in the Last Year: Never true    Ran Out of Food in the Last Year: Never true  Transportation Needs: No Transportation Needs (08/19/2022)   PRAPARE - Administrator, Civil Service (Medical): No    Lack of Transportation (Non-Medical): No  Physical Activity: Not on file  Stress: Not on file  Social Connections: Not on file    Review of Systems: A 12 point ROS discussed and pertinent positives are indicated in the HPI above.  All other systems  are negative.  Review of Systems  Vital Signs: There were no vitals taken for this visit.  Physical Exam  Imaging: No results found.  Labs:  CBC: Recent Labs    08/19/22 0905 08/20/22 0332 08/21/22 0309 09/16/22 1202  WBC 23.8* 22.8* 17.8* 9.5  HGB 10.1* 9.7* 9.5* 12.1*  HCT 30.8* 30.8* 28.6* 38.3*  PLT 277 243 275 500*    COAGS: No results for input(s): "INR", "APTT" in the last 8760 hours.  BMP: Recent Labs    08/19/22 0905 08/20/22 0332 08/21/22 0309 09/16/22 1202  NA 138 134* 136 139  K 3.8 4.0 3.6 4.2  CL 105 102 99 101  CO2 24 22 28 28   GLUCOSE 155* 84 117* 119*  BUN 7 6 5* 13  CALCIUM 8.2* 7.8* 8.2* 9.0  CREATININE 0.66 0.67 0.57* 0.58*  GFRNONAA >60 >60 >60 >60    LIVER FUNCTION TESTS: Recent Labs    07/02/22 1503 07/03/22 0533 07/16/22 1101 09/16/22 1202  BILITOT 0.6 0.6 0.5 0.3  AST 23 14* 17 30  ALT 19 15 15 24   ALKPHOS 61 51 57 77  PROT 8.3* 6.4* 7.9 8.0  ALBUMIN 4.6 3.5 4.4 4.2    TUMOR MARKERS: No results for input(s): "AFPTM", "CEA", "CA199", "CHROMGRNA" in the last 8760  hours.  Assessment and Plan:  Pancreatic cancer s/p surgery; pending adjuvant chemotherapy: [name], 62 year old male, presents today to the [facility] Interventional Radiology department for an image-guided port-a-catheter placement.   Risks and benefits of image-guided Port-a-catheter placement were discussed with the patient including, but not limited to bleeding, infection, pneumothorax, or fibrin sheath development and need for additional procedures.  All of the patient's questions were answered, patient is agreeable to proceed. He has been NPO. He is a full code.   Consent signed and in chart.  Thank you for this interesting consult.  I greatly enjoyed meeting Ashante Snelling [facility] and look forward to participating in their care.  A copy of this report was sent to the requesting provider on this date.  Electronically Signed: Alwyn Ren, AGACNP-BC [phone] 09/27/2022, 10:11 AM   I spent a total of  30 Minutes   in face to face in clinical consultation, greater than 50% of which was counseling/coordinating care for port-a-catheter placement.

## 2022-09-28 ENCOUNTER — Other Ambulatory Visit (HOSPITAL_COMMUNITY): Payer: Self-pay

## 2022-09-28 ENCOUNTER — Other Ambulatory Visit: Payer: Self-pay | Admitting: Student

## 2022-09-28 ENCOUNTER — Inpatient Hospital Stay: Payer: Medicare HMO | Admitting: Pharmacist

## 2022-09-28 ENCOUNTER — Other Ambulatory Visit: Payer: Self-pay

## 2022-09-28 ENCOUNTER — Inpatient Hospital Stay: Payer: Medicare HMO

## 2022-09-28 DIAGNOSIS — C252 Malignant neoplasm of tail of pancreas: Secondary | ICD-10-CM | POA: Diagnosis not present

## 2022-09-28 DIAGNOSIS — Z5111 Encounter for antineoplastic chemotherapy: Secondary | ICD-10-CM | POA: Diagnosis not present

## 2022-09-28 DIAGNOSIS — G8929 Other chronic pain: Secondary | ICD-10-CM | POA: Diagnosis not present

## 2022-09-28 DIAGNOSIS — Z808 Family history of malignant neoplasm of other organs or systems: Secondary | ICD-10-CM | POA: Diagnosis not present

## 2022-09-28 DIAGNOSIS — C259 Malignant neoplasm of pancreas, unspecified: Secondary | ICD-10-CM

## 2022-09-28 DIAGNOSIS — F17210 Nicotine dependence, cigarettes, uncomplicated: Secondary | ICD-10-CM | POA: Diagnosis not present

## 2022-09-28 NOTE — Progress Notes (Signed)
Clinical Pharmacist Practitioner [facility]  [phone] [phone]  Patient Care Team: Gracelyn Nurse, MD as PCP - General (Internal Medicine) Benita Gutter, RN as Oncology Nurse Navigator   Name of the [name]  [id number]  28-Aug-1960   Date of visit: 09/28/22  HPI: Patient is a 62 y.o. male with stage IIa pancreatic cancer. Planned adjuvant treatment with capecitabine and gemcitabine. Planned start 10/01/2022.  Reason for Consult: Capecitabine oral chemotherapy education.   PAST MEDICAL HISTORY: Past Medical History:  Diagnosis Date   Arthritis    GERD (gastroesophageal reflux disease)    Hiatal hernia    History of kidney stones    Hypertension    Pancreatic cancer (HCC) 2024    HEMATOLOGY/ONCOLOGY HISTORY:  Oncology History  Pancreatic adenocarcinoma (HCC)  07/16/2022 Initial Diagnosis   Pancreatic adenocarcinoma (HCC)   07/16/2022 Cancer Staging   Staging form: Exocrine Pancreas, AJCC 8th Edition - Clinical stage from 07/16/2022: Stage IIA (cT3, cN0, cM0) - Signed by Jeralyn Ruths, MD on 07/16/2022 Stage prefix: Initial diagnosis Total positive nodes: 0   10/01/2022 -  Chemotherapy   Patient is on Treatment Plan : PANCREAS Gemcitabine D1,8,15 (1000) + Capecitabine D1-21 q28d x 6 cycles       ALLERGIES:  is allergic to aspirin, pregabalin, tape, codeine, oxymorphone, penicillins, and vicodin [hydrocodone-acetaminophen].  MEDICATIONS:  Current Outpatient Medications  Medication Sig Dispense Refill   acetaminophen (TYLENOL) 500 MG tablet Take 2 tablets (1,000 mg total) by mouth 3 (three) times daily. 30 tablet 0   ALPRAZolam (XANAX) 1 MG tablet Take 1 mg by mouth 2 (two) times daily.     budesonide-formoterol (SYMBICORT) 160-4.5 MCG/ACT inhaler Inhale 2 puffs into the lungs 2 (two) times daily as needed (shortness of breath).     capecitabine (XELODA) 500 MG tablet Take 3 tablets (1,500 mg total)  by mouth 2 (two) times daily after a meal. Take for 21 days, then hold for 7 days. Repeat every 28 days. 126 tablet 1   lidocaine-prilocaine (EMLA) cream Apply to affected area once 30 g 3   lisinopril (ZESTRIL) 20 MG tablet Take 20 mg by mouth daily.     meloxicam (MOBIC) 15 MG tablet Take 15 mg by mouth daily.     methocarbamol (ROBAXIN) 500 MG tablet Take 2 tablets (1,000 mg total) by mouth every 6 (six) hours as needed. 60 tablet 0   morphine (MS CONTIN) 30 MG 12 hr tablet Take 1 tablet (30 mg total) by mouth every 8 (eight) hours. 90 tablet 0   omeprazole (PRILOSEC OTC) 20 MG tablet Take 20 mg by mouth daily.     ondansetron (ZOFRAN) 4 MG tablet Take 1 tablet (4 mg total) by mouth every 6 (six) hours as needed for nausea. 20 tablet 0   ondansetron (ZOFRAN) 8 MG tablet Take 1 tablet (8 mg total) by mouth every 8 (eight) hours as needed for nausea or vomiting. 60 tablet 1   Oxycodone HCl 10 MG TABS Take 1 tablet (10 mg total) by mouth every 4 (four) hours as needed (breakthrough pain). 20 tablet 0   pantoprazole (PROTONIX) 40 MG tablet Take 1 tablet (40 mg total) by mouth 2 (two) times daily. 60 tablet 0   polyethylene glycol (MIRALAX / GLYCOLAX) 17 g packet Take 17 g by mouth daily. 14 each 0   prochlorperazine (COMPAZINE) 10 MG tablet Take 1 tablet (10 mg total) by mouth every 6 (six) hours as needed for  nausea or vomiting. 60 tablet 2   rosuvastatin (CRESTOR) 20 MG tablet Take 20 mg by mouth daily.     senna (SENOKOT) 8.6 MG TABS tablet Take 2 tablets (17.2 mg total) by mouth at bedtime. 120 tablet 0   No current facility-administered medications for this visit.    VITAL SIGNS: There were no vitals taken for this visit. There were no vitals filed for this visit.  Estimated body mass index is 22.02 kg/m as calculated from the following:   Height as of 09/16/22: 5\' 8"  (1.727 m).   Weight as of 09/16/22: 65.7 kg (144 lb 12.8 oz).  LABS: CBC:    Component Value Date/Time   WBC 9.5  09/16/2022 1202   HGB 12.1 (L) 09/16/2022 1202   HGB 15.3 06/30/2014 1750   HCT 38.3 (L) 09/16/2022 1202   HCT 44.3 06/30/2014 1750   PLT 500 (H) 09/16/2022 1202   PLT 226 06/30/2014 1750   MCV 91.2 09/16/2022 1202   MCV 88 06/30/2014 1750   NEUTROABS 4.8 09/16/2022 1202   LYMPHSABS 2.8 09/16/2022 1202   MONOABS 0.9 09/16/2022 1202   EOSABS 0.8 (H) 09/16/2022 1202   BASOSABS 0.2 (H) 09/16/2022 1202   Comprehensive Metabolic Panel:    Component Value Date/Time   NA 139 09/16/2022 1202   NA 139 06/30/2014 1750   K 4.2 09/16/2022 1202   K 3.3 (L) 06/30/2014 1750   CL 101 09/16/2022 1202   CL 105 06/30/2014 1750   CO2 28 09/16/2022 1202   CO2 20 (L) 06/30/2014 1750   BUN 13 09/16/2022 1202   BUN 10 06/30/2014 1750   CREATININE 0.58 (L) 09/16/2022 1202   CREATININE 0.85 06/30/2014 1750   GLUCOSE 119 (H) 09/16/2022 1202   GLUCOSE 115 (H) 06/30/2014 1750   CALCIUM 9.0 09/16/2022 1202   CALCIUM 8.9 06/30/2014 1750   AST 30 09/16/2022 1202   ALT 24 09/16/2022 1202   ALKPHOS 77 09/16/2022 1202   BILITOT 0.3 09/16/2022 1202   PROT 8.0 09/16/2022 1202   ALBUMIN 4.2 09/16/2022 1202     Present during today's visit: Patient and his wife.   Start plan: Patient to start capecitabine on 10/01/2022 along with in office gemcitabine.    Patient Education I spoke with patient for overview of new oral chemotherapy medication: capecitabine.   Administration: Counseled patient on administration, dosing, side effects, monitoring, drug-food interactions, safe handling, storage, and disposal. Patient will take 3 tablets (1,500 mg total) by mouth 2 (two) times daily after a meal. Take for 21 days, then hold for 7 days. Repeat every 28 days.  Side Effects: Side effects include but not limited to: diarrhea, hand-foot syndrome, mouth sores, edema, decreased wbc, fatigue, N/V Diarrhea: patient reports have loperamide on hand, he knows to use as needed and to call the office if he is having 4  or more loose stools per day. Hand-foot syndrome: patient provided with [place] Smooth Extra Care 20. He knows to report any skin changes on his hands or feet. Mouth sores: patient will call to request Magic Mouthwash is needed.    Drug-drug Interactions (DDI): Patient reported being on PPI for years, will not attempt to discontinue due to long term PPI use and varying information on the clinical impact.  Adherence: After discussion with patient no patient barriers to medication adherence identified.  Reviewed with patient importance of keeping a medication schedule and plan for any missed doses.  The [facility] voiced understanding and appreciation. All questions answered. Medication  handout and calendar provided.   Provided patient with Oral Chemotherapy Navigation Clinic phone number. Patient knows to call the office with questions or concerns. Oral Chemotherapy Navigation Clinic will continue to follow.  Patient expressed understanding and was in agreement with this plan. He also understands that He can call clinic at any time with any questions, concerns, or complaints.   Medication Access Issues: Patient met with Romeo Apple to set up medication delivery from [facility]  Follow-up plan: Patient to return to clinic as scheduled.  Thank you for allowing me to participate in the care of this patient.   Time Total: 20 minutes  Visit consisted of counseling and education on dealing with issues of symptom management in the setting of serious and potentially life-threatening illness.Greater than 50%  of this time was spent counseling and coordinating care related to the above assessment and plan.  Signed by: Remi Haggard, PharmD, BCPS, Nolon Bussing, CPP Hematology/Oncology Clinical Pharmacist Practitioner [place]/[facility] [phone]  09/28/2022 11:42 AM

## 2022-09-28 NOTE — Progress Notes (Signed)
Patient for IR Port Insertion on Wed 09/29/2022, I called and spoke with the patient on the phone and gave pre-procedure instructions. Pt was made aware to be here at 7:30a, NPO after MN prior to procedure as well as driver post procedure/recovery/discharge. Pt stated understanding.  Called 09/28/2022

## 2022-09-28 NOTE — Telephone Encounter (Signed)
Patient successfully OnBoarded and drug education provided by pharmacist. Medication scheduled to be shipped on 09/29/22 for delivery on 09/30/22 from [facility] to patient's address. Patient also knows to call me at [phone] with any questions or concerns regarding receiving medication or if there is any unexpected change in co-pay.    Ardeen Fillers, CPhT Oncology Pharmacy Patient Advocate  [facility]  [phone] (phone) [phone] (fax) 09/28/2022 11:50 AM

## 2022-09-29 ENCOUNTER — Encounter: Payer: Self-pay | Admitting: Radiology

## 2022-09-29 ENCOUNTER — Other Ambulatory Visit: Payer: Self-pay

## 2022-09-29 ENCOUNTER — Ambulatory Visit
Admission: RE | Admit: 2022-09-29 | Discharge: 2022-09-29 | Disposition: A | Discharge status: Home / Self Care | Payer: Medicare HMO | Source: Ambulatory Visit | Attending: Oncology | Admitting: Oncology

## 2022-09-29 DIAGNOSIS — I10 Essential (primary) hypertension: Secondary | ICD-10-CM | POA: Diagnosis not present

## 2022-09-29 DIAGNOSIS — M549 Dorsalgia, unspecified: Secondary | ICD-10-CM | POA: Insufficient documentation

## 2022-09-29 DIAGNOSIS — G8929 Other chronic pain: Secondary | ICD-10-CM | POA: Insufficient documentation

## 2022-09-29 DIAGNOSIS — C259 Malignant neoplasm of pancreas, unspecified: Secondary | ICD-10-CM

## 2022-09-29 DIAGNOSIS — F17210 Nicotine dependence, cigarettes, uncomplicated: Secondary | ICD-10-CM | POA: Insufficient documentation

## 2022-09-29 DIAGNOSIS — Z452 Encounter for adjustment and management of vascular access device: Secondary | ICD-10-CM | POA: Diagnosis not present

## 2022-09-29 HISTORY — PX: IR IMAGING GUIDED PORT INSERTION: IMG5740

## 2022-09-29 MED ORDER — LORAZEPAM 2 MG/ML IJ SOLN
INTRAMUSCULAR | Status: AC | PRN
  Administered 2022-09-29: .5 mg via INTRAVENOUS

## 2022-09-29 MED ORDER — MIDAZOLAM HCL 2 MG/2ML IJ SOLN
INTRAMUSCULAR | Status: AC
  Filled 2022-09-29: qty 4

## 2022-09-29 MED ORDER — LIDOCAINE-EPINEPHRINE 1 %-1:100000 IJ SOLN
10.0000 mL | Freq: Once | INTRAMUSCULAR | Status: AC
  Administered 2022-09-29: 10 mL via INTRADERMAL

## 2022-09-29 MED ORDER — FENTANYL CITRATE (PF) 100 MCG/2ML IJ SOLN
INTRAMUSCULAR | Status: AC
  Filled 2022-09-29: qty 2

## 2022-09-29 MED ORDER — HEPARIN SOD (PORK) LOCK FLUSH 100 UNIT/ML IV SOLN
500.0000 [IU] | Freq: Once | INTRAVENOUS | Status: AC
  Administered 2022-09-29: 500 [IU] via INTRAVENOUS

## 2022-09-29 MED ORDER — HEPARIN SOD (PORK) LOCK FLUSH 100 UNIT/ML IV SOLN
INTRAVENOUS | Status: AC
  Filled 2022-09-29: qty 5

## 2022-09-29 MED ORDER — SODIUM CHLORIDE 0.9 % IV SOLN: INTRAVENOUS | Status: DC

## 2022-09-29 MED ORDER — MIDAZOLAM HCL 5 MG/5ML IJ SOLN
INTRAMUSCULAR | Status: AC | PRN
  Administered 2022-09-29: 2 mg via INTRAVENOUS

## 2022-09-29 MED ORDER — FENTANYL CITRATE (PF) 100 MCG/2ML IJ SOLN
INTRAMUSCULAR | Status: AC | PRN
  Administered 2022-09-29 (×2): 50 ug via INTRAVENOUS

## 2022-09-29 MED ORDER — LIDOCAINE-EPINEPHRINE 1 %-1:100000 IJ SOLN
INTRAMUSCULAR | Status: AC
  Filled 2022-09-29: qty 1

## 2022-09-29 NOTE — Discharge Instructions (Signed)
Implanted Port Home Guide  An implanted port is a type of central line that is placed under the skin. Central lines are used to provide IV access when treatment or nutrition needs to be given through a person's veins. Implanted ports are used for long-term IV access. An implanted port may be placed because: You need IV medicine that would be irritating to the small veins in your hands or arms. You need long-term IV medicines, such as antibiotics. You need IV nutrition for a long period. You need frequent blood draws for lab tests. You need dialysis.   Implanted ports are usually placed in the chest area, but they can also be placed in the upper arm, the abdomen, or the leg. An implanted port has two main parts: Reservoir. The reservoir is round and will appear as a small, raised area under your skin. The reservoir is the part where a needle is inserted to give medicines or draw blood. Catheter. The catheter is a thin, flexible tube that extends from the reservoir. The catheter is placed into a large vein. Medicine that is inserted into the reservoir goes into the catheter and then into the vein.   How will I care for my incision  You may shower tomorrow Please remove dressing in 24hrs not other skin care is needed  How is my port accessed? Special steps must be taken to access the port: Before the port is accessed, a numbing cream can be placed on the skin. This helps numb the skin over the port site. Your health care provider uses a sterile technique to access the port. Your health care provider must put on a mask and sterile gloves. The skin over your port is cleaned carefully with an antiseptic and allowed to dry. The port is gently pinched between sterile gloves, and a needle is inserted into the port. Only "non-coring" port needles should be used to access the port. Once the port is accessed, a blood return should be checked. This helps ensure that the port is in the vein and is not  clogged. If your port needs to remain accessed for a constant infusion, a clear (transparent) bandage will be placed over the needle site. The bandage and needle will need to be changed every week, or as directed by your health care provider.   What is flushing? Flushing helps keep the port from getting clogged. Follow your health care provider's instructions on how and when to flush the port. Ports are usually flushed with saline solution or a medicine called heparin. The need for flushing will depend on how the port is used. If the port is used for intermittent medicines or blood draws, the port will need to be flushed: After medicines have been given. After blood has been drawn. As part of routine maintenance. If a constant infusion is running, the port may not need to be flushed.   How long will my port stay implanted? The port can stay in for as long as your health care provider thinks it is needed. When it is time for the port to come out, surgery will be done to remove it. The procedure is similar to the one performed when the port was put in. When should I seek immediate medical care? When you have an implanted port, you should seek immediate medical care if: You notice a bad smell coming from the incision site. You have swelling, redness, or drainage at the incision site. You have more swelling or pain at the   port site or the surrounding area. You have a fever that is not controlled with medicine.   This information is not intended to replace advice given to you by your health care provider. Make sure you discuss any questions you have with your health care provider. Document Released: 02/22/2005 Document Revised: 07/31/2015 Document Reviewed: 10/30/2012 Elsevier Interactive Patient Education  2017 Elsevier Inc.   

## 2022-09-29 NOTE — Procedures (Signed)
Interventional Radiology Procedure Note  Date of Procedure: 09/29/2022  Procedure: Port placement   Findings:  1. Right  chest port placement    Complications: No immediate complications noted.   Estimated Blood Loss: minimal  Follow-up and Recommendations: 1. Ready for use    Olive Bass, MD  Vascular & Interventional Radiology  09/29/2022 11:20 AM

## 2022-09-30 ENCOUNTER — Encounter: Payer: Self-pay | Admitting: Oncology

## 2022-10-01 ENCOUNTER — Inpatient Hospital Stay: Payer: Medicare HMO | Admitting: Oncology

## 2022-10-01 ENCOUNTER — Inpatient Hospital Stay: Payer: Medicare HMO

## 2022-10-01 ENCOUNTER — Encounter: Payer: Self-pay | Admitting: Oncology

## 2022-10-01 VITALS — BP 134/79 | HR 64 | Resp 18

## 2022-10-01 DIAGNOSIS — C259 Malignant neoplasm of pancreas, unspecified: Secondary | ICD-10-CM | POA: Diagnosis not present

## 2022-10-01 DIAGNOSIS — E876 Hypokalemia: Secondary | ICD-10-CM

## 2022-10-01 DIAGNOSIS — Z808 Family history of malignant neoplasm of other organs or systems: Secondary | ICD-10-CM | POA: Diagnosis not present

## 2022-10-01 DIAGNOSIS — Z5111 Encounter for antineoplastic chemotherapy: Secondary | ICD-10-CM | POA: Diagnosis not present

## 2022-10-01 DIAGNOSIS — G8929 Other chronic pain: Secondary | ICD-10-CM | POA: Diagnosis not present

## 2022-10-01 DIAGNOSIS — C252 Malignant neoplasm of tail of pancreas: Secondary | ICD-10-CM | POA: Diagnosis not present

## 2022-10-01 DIAGNOSIS — F17210 Nicotine dependence, cigarettes, uncomplicated: Secondary | ICD-10-CM | POA: Diagnosis not present

## 2022-10-01 LAB — CMP (CANCER CENTER ONLY)
ALT: 18 U/L (ref 0–44)
AST: 18 U/L (ref 15–41)
Albumin: 4 g/dL (ref 3.5–5.0)
Alkaline Phosphatase: 59 U/L (ref 38–126)
Anion gap: 7 (ref 5–15)
BUN: 9 mg/dL (ref 6–20)
CO2: 26 mmol/L (ref 22–32)
Calcium: 8.8 mg/dL — ABNORMAL LOW (ref 8.9–10.3)
Chloride: 105 mmol/L (ref 98–111)
Creatinine: 0.58 mg/dL — ABNORMAL LOW (ref 0.61–1.24)
GFR, Estimated: >60 mL/min (ref >60)
Glucose, Bld: 150 mg/dL — ABNORMAL HIGH (ref 70–99)
Potassium: 3.1 mmol/L — ABNORMAL LOW (ref 3.5–5.1)
Sodium: 138 mmol/L (ref 135–145)
Total Bilirubin: 0.6 mg/dL (ref 0.3–1.2)
Total Protein: 7.2 g/dL (ref 6.5–8.1)

## 2022-10-01 LAB — CBC WITH DIFFERENTIAL (CANCER CENTER ONLY)
Abs Immature Granulocytes: 0.02 10*3/uL (ref 0.00–0.07)
Basophils Absolute: 0.2 10*3/uL — ABNORMAL HIGH (ref 0.0–0.1)
Basophils Relative: 2 %
Eosinophils Absolute: 0.5 10*3/uL (ref 0.0–0.5)
Eosinophils Relative: 5 %
HCT: 36 % — ABNORMAL LOW (ref 39.0–52.0)
Hemoglobin: 11.8 g/dL — ABNORMAL LOW (ref 13.0–17.0)
Immature Granulocytes: 0 %
Lymphocytes Relative: 29 %
Lymphs Abs: 3 10*3/uL (ref 0.7–4.0)
MCH: 28.8 pg (ref 26.0–34.0)
MCHC: 32.8 g/dL (ref 30.0–36.0)
MCV: 87.8 fL (ref 80.0–100.0)
Monocytes Absolute: 0.9 10*3/uL (ref 0.1–1.0)
Monocytes Relative: 8 %
Neutro Abs: 6 10*3/uL (ref 1.7–7.7)
Neutrophils Relative %: 56 %
Platelet Count: 471 10*3/uL — ABNORMAL HIGH (ref 150–400)
RBC: 4.1 MIL/uL — ABNORMAL LOW (ref 4.22–5.81)
RDW: 14.1 % (ref 11.5–15.5)
WBC Count: 10.6 10*3/uL — ABNORMAL HIGH (ref 4.0–10.5)
nRBC: 0 % (ref 0.0–0.2)

## 2022-10-01 MED ORDER — SODIUM CHLORIDE 0.9 % IV SOLN
Freq: Once | INTRAVENOUS | Status: AC
  Filled 2022-10-01: qty 250

## 2022-10-01 MED ORDER — SODIUM CHLORIDE 0.9 % IV SOLN
1000.0000 mg/m2 | Freq: Once | INTRAVENOUS | Status: AC
  Administered 2022-10-01: 1786 mg via INTRAVENOUS
  Filled 2022-10-01: qty 11.64

## 2022-10-01 MED ORDER — HEPARIN SOD (PORK) LOCK FLUSH 100 UNIT/ML IV SOLN
500.0000 [IU] | Freq: Once | INTRAVENOUS | Status: AC | PRN
  Administered 2022-10-01: 500 [IU]
  Filled 2022-10-01: qty 5

## 2022-10-01 MED ORDER — POTASSIUM CHLORIDE 20 MEQ/100ML IV SOLN
20.0000 meq | Freq: Once | INTRAVENOUS | Status: AC
  Administered 2022-10-01: 20 meq via INTRAVENOUS

## 2022-10-01 MED ORDER — PROCHLORPERAZINE MALEATE 10 MG PO TABS
10.0000 mg | ORAL_TABLET | Freq: Once | ORAL | Status: AC
  Administered 2022-10-01: 10 mg via ORAL
  Filled 2022-10-01: qty 1

## 2022-10-01 MED ORDER — POTASSIUM CHLORIDE CRYS ER 20 MEQ PO TBCR: 20.0000 meq | EXTENDED_RELEASE_TABLET | Freq: Two times a day (BID) | ORAL | 1 refills | Status: DC

## 2022-10-01 NOTE — Progress Notes (Signed)
[facility]  Telephone:(336) [phone] [phone]) [phone]  ID: David Harrell OB: 1960/11/12  MR#: [id number]  UXL#:[id number]  Patient Care Team: Gracelyn Nurse, MD as PCP - General (Internal Medicine) Benita Gutter, RN as Oncology Nurse Navigator  CHIEF COMPLAINT: Pathologic stage IIa adenocarcinoma the pancreas.  INTERVAL HISTORY: Patient returns to clinic today for further evaluation and initiation of cycle 1, day 1 of gemcitabine and Xeloda.  He currently feels well and is asymptomatic. He has no neurologic complaints.  He denies any recent fevers or illnesses.  He has a good appetite and denies weight loss.  He has no chest pain, shortness of breath, cough, or hemoptysis.  He has no abdominal pain.  He denies any nausea, vomiting, constipation, or diarrhea.  He has no urinary complaints.  Patient offers no specific complaints today.  REVIEW OF SYSTEMS:   Review of Systems  Constitutional: Negative.  Negative for fever, malaise/fatigue and weight loss.  Respiratory: Negative.  Negative for cough, hemoptysis and shortness of breath.   Cardiovascular: Negative.  Negative for chest pain and leg swelling.  Gastrointestinal: Negative.  Negative for abdominal pain.  Genitourinary: Negative.  Negative for dysuria.  Musculoskeletal: Negative.  Negative for back pain.  Skin: Negative.  Negative for rash.  Neurological: Negative.  Negative for dizziness, focal weakness, weakness and headaches.  Psychiatric/Behavioral: Negative.  The patient is not nervous/anxious.     As per HPI. Otherwise, a complete review of systems is negative.  PAST MEDICAL HISTORY: Past Medical History:  Diagnosis Date   Arthritis    GERD (gastroesophageal reflux disease)    Hiatal hernia    History of kidney stones    Hypertension    Pancreatic cancer (HCC) 2024    PAST SURGICAL HISTORY: Past Surgical History:  Procedure Laterality Date   ANTERIOR CERVICAL DECOMP/DISCECTOMY FUSION      BACK SURGERY     x5   ESOPHAGOGASTRODUODENOSCOPY N/A 07/12/2022   Procedure: ESOPHAGOGASTRODUODENOSCOPY (EGD);  Surgeon: Lemar Lofty., MD;  Location: [facility];  Service: Gastroenterology;  Laterality: N/A;   ESOPHAGOGASTRODUODENOSCOPY (EGD) WITH PROPOFOL N/A 07/05/2022   Procedure: ESOPHAGOGASTRODUODENOSCOPY (EGD) WITH PROPOFOL;  Surgeon: [name], DO;  Location: [facility] ENDOSCOPY;  Service: Gastroenterology;  Laterality: N/A;   EUS N/A 07/12/2022   Procedure: UPPER ENDOSCOPIC ULTRASOUND (EUS) RADIAL;  Surgeon: Lemar Lofty., MD;  Location: [facility] ENDOSCOPY;  Service: Gastroenterology;  Laterality: N/A;   FINE NEEDLE ASPIRATION  07/12/2022   Procedure: FINE NEEDLE ASPIRATION;  Surgeon: Meridee Score, Netty Starring., MD;  Location: [facility];  Service: Gastroenterology;;   IR IMAGING GUIDED PORT INSERTION  09/29/2022   LAPAROSCOPY N/A 08/18/2022   Procedure: STAGING LAPAROSCOPY;  Surgeon: Fritzi Mandes, MD;  Location: [facility] OR;  Service: General;  Laterality: N/A;   OPERATIVE ULTRASOUND N/A 08/18/2022   Procedure: INTRAOPERATIVE ULTRASOUND;  Surgeon: Fritzi Mandes, MD;  Location: [facility] OR;  Service: General;  Laterality: N/A;   SHOULDER ARTHROSCOPY Bilateral    possibly metal in right shoulder, pt unsure   SPLENECTOMY, TOTAL N/A 08/18/2022   Procedure: SPLENECTOMY;  Surgeon: Fritzi Mandes, MD;  Location: [facility] OR;  Service: General;  Laterality: N/A;    FAMILY HISTORY: Family History  Problem Relation Age of Onset   Liver cancer Sister    Spina bifida Paternal Grandfather     ADVANCED DIRECTIVES (Y/N):  N  HEALTH MAINTENANCE: Social History   Tobacco Use   Smoking status: Every Day    Current packs/day: 0.50  Average packs/day: 0.5 packs/day for 40.0 years (20.0 ttl pk-yrs)    Types: Cigarettes   Smokeless tobacco: Never  Vaping Use   Vaping status: Never Used  Substance Use Topics   Alcohol use: Not Currently   Drug use: Yes    Frequency: 1.0  times per week    Types: Marijuana     Colonoscopy:  PAP:  Bone density:  Lipid panel:  Allergies  Allergen Reactions   Aspirin     Acid reflux     Pregabalin     Dizziness   Tape     Blisters skin, paper tape is ok   Codeine Palpitations    headaches   Oxymorphone Rash   Penicillins Hives and Rash   Vicodin [Hydrocodone-Acetaminophen] Palpitations    Headaches     Current Outpatient Medications  Medication Sig Dispense Refill   acetaminophen (TYLENOL) 500 MG tablet Take 2 tablets (1,000 mg total) by mouth 3 (three) times daily. 30 tablet 0   ALPRAZolam (XANAX) 1 MG tablet Take 1 mg by mouth 2 (two) times daily.     budesonide-formoterol (SYMBICORT) 160-4.5 MCG/ACT inhaler Inhale 2 puffs into the lungs 2 (two) times daily as needed (shortness of breath).     capecitabine (XELODA) 500 MG tablet Take 3 tablets (1,500 mg total) by mouth 2 (two) times daily after a meal. Take for 21 days, then hold for 7 days. Repeat every 28 days. 126 tablet 1   lidocaine-prilocaine (EMLA) cream Apply to affected area once 30 g 3   lisinopril (ZESTRIL) 20 MG tablet Take 20 mg by mouth daily.     meloxicam (MOBIC) 15 MG tablet Take 15 mg by mouth daily.     methocarbamol (ROBAXIN) 500 MG tablet Take 2 tablets (1,000 mg total) by mouth every 6 (six) hours as needed. 60 tablet 0   morphine (MS CONTIN) 30 MG 12 hr tablet Take 1 tablet (30 mg total) by mouth every 8 (eight) hours. 90 tablet 0   omeprazole (PRILOSEC OTC) 20 MG tablet Take 20 mg by mouth daily.     ondansetron (ZOFRAN) 4 MG tablet Take 1 tablet (4 mg total) by mouth every 6 (six) hours as needed for nausea. 20 tablet 0   ondansetron (ZOFRAN) 8 MG tablet Take 1 tablet (8 mg total) by mouth every 8 (eight) hours as needed for nausea or vomiting. 60 tablet 1   Oxycodone HCl 10 MG TABS Take 1 tablet (10 mg total) by mouth every 4 (four) hours as needed (breakthrough pain). 20 tablet 0   pantoprazole (PROTONIX) 40 MG tablet Take 1 tablet  (40 mg total) by mouth 2 (two) times daily. 60 tablet 0   polyethylene glycol (MIRALAX / GLYCOLAX) 17 g packet Take 17 g by mouth daily. 14 each 0   potassium chloride SA (KLOR-CON M) 20 MEQ tablet Take 1 tablet (20 mEq total) by mouth 2 (two) times daily. 60 tablet 1   prochlorperazine (COMPAZINE) 10 MG tablet Take 1 tablet (10 mg total) by mouth every 6 (six) hours as needed for nausea or vomiting. 60 tablet 2   rosuvastatin (CRESTOR) 20 MG tablet Take 20 mg by mouth daily.     senna (SENOKOT) 8.6 MG TABS tablet Take 2 tablets (17.2 mg total) by mouth at bedtime. 120 tablet 0   No current facility-administered medications for this visit.   Facility-Administered Medications Ordered in Other Visits  Medication Dose Route Frequency Provider Last Rate Last Admin   potassium chloride 20  mEq in 100 mL IVPB  20 mEq Intravenous Once Jeralyn Ruths, MD        OBJECTIVE: Vitals:   10/01/22 0915  BP: (!) 153/82  Pulse: 68  Resp: 18  Temp: 97.8 F (36.6 C)  SpO2: 100%      Body mass index is 21.07 kg/m.    ECOG FS:0 - Asymptomatic  General: Well-developed, well-nourished, no acute distress. Eyes: Pink conjunctiva, anicteric sclera. HEENT: Normocephalic, moist mucous membranes. Lungs: No audible wheezing or coughing. Heart: Regular rate and rhythm. Abdomen: Soft, nontender, no obvious distention. Musculoskeletal: No edema, cyanosis, or clubbing. Neuro: Alert, answering all questions appropriately. Cranial nerves grossly intact. Skin: No rashes or petechiae noted. Psych: Normal affect.  LAB RESULTS:  Lab Results  Component Value Date   NA 138 10/01/2022   K 3.1 (L) 10/01/2022   CL 105 10/01/2022   CO2 26 10/01/2022   GLUCOSE 150 (H) 10/01/2022   BUN 9 10/01/2022   CREATININE 0.58 (L) 10/01/2022   CALCIUM 8.8 (L) 10/01/2022   PROT 7.2 10/01/2022   ALBUMIN 4.0 10/01/2022   AST 18 10/01/2022   ALT 18 10/01/2022   ALKPHOS 59 10/01/2022   BILITOT 0.6 10/01/2022   GFRNONAA  >60 10/01/2022   GFRAA >60 04/13/2018    Lab Results  Component Value Date   WBC 10.6 (H) 10/01/2022   NEUTROABS 6.0 10/01/2022   HGB 11.8 (L) 10/01/2022   HCT 36.0 (L) 10/01/2022   MCV 87.8 10/01/2022   PLT 471 (H) 10/01/2022     STUDIES: IR IMAGING GUIDED PORT INSERTION  Result Date: 09/29/2022 INDICATION: port placement for chemotherapy EXAM: Chest port placement using ultrasound and fluoroscopic guidance MEDICATIONS: Documented in the EMR ANESTHESIA/SEDATION: Moderate (conscious) sedation was employed during this procedure. A total of Versed 2.5 mg and Fentanyl 100 mcg was administered intravenously. Moderate Sedation Time: 27 minutes. The patient's level of consciousness and vital signs were monitored continuously by radiology nursing throughout the procedure under my direct supervision. FLUOROSCOPY TIME:  Fluoroscopy Time: 0.4 minutes (1 mGy) COMPLICATIONS: None immediate. PROCEDURE: Informed written consent was obtained from the patient after a thorough discussion of the procedural risks, benefits and alternatives. All questions were addressed. Maximal Sterile Barrier Technique was utilized including caps, mask, sterile gowns, sterile gloves, sterile drape, hand hygiene and skin antiseptic. A timeout was performed prior to the initiation of the procedure. The patient was placed supine on the exam table. The right neck and chest was prepped and draped in the standard sterile fashion. A preliminary ultrasound of the right neck was performed and demonstrates a patent right internal jugular vein. A permanent ultrasound image was stored in the electronic medical record. The overlying skin was anesthetized with 1% Lidocaine. Using ultrasound guidance, access was obtained into the right internal jugular vein using a 21 gauge micropuncture set. A wire was advanced into the SVC, a short incision was made at the puncture site, and serial dilatation performed. Next, in an ipsilateral infraclavicular  location, an incision was made at the site of the subcutaneous reservoir. Blunt dissection was used to open a pocket to contain the reservoir. A subcutaneous tunnel was then created from the port site to the puncture site. A(n) 8 Fr single lumen catheter was advanced through the tunnel. The catheter was attached to the port and this was placed in the subcutaneous pocket. Under fluoroscopic guidance, a peel away sheath was placed, and the catheter was trimmed to the appropriate length and was advanced into the central  veins. The tip of the catheter lies near the superior cavoatrial junction. The port flushes and aspirates appropriately. The port was flushed and locked with heparinized saline. The port pocket was closed in 2 layers using 3-0 and 4-0 Vicryl/absorbable suture. Dermabond was also applied to both incisions. The patient tolerated the procedure well and was transferred to recovery in stable condition. IMPRESSION: Successful right-sided chest port placement via the right internal jugular vein. The port is ready for immediate use. Electronically Signed   By: Olive Bass M.D.   On: 09/29/2022 15:40    ASSESSMENT: Pathologic stage IIa adenocarcinoma the pancreas.  PLAN:    Pathologic stage IIa adenocarcinoma the pancreas: Patient underwent surgical resection on August 18, 2022 confirming stage of disease.  Previously, PET scan did not reveal any metastatic disease.  Preoperatively patient's CA 19-9 was 130, currently within normal limits at 34.  Recommendation was to pursue adjuvant chemotherapy using gemcitabine and capecitabine.  Patient will receive weekly gemcitabine on days 1, 8, and 15 with oral capecitabine days 1 through 21.  Patient will have a 7-day break.  Plan on 4-6 cycles.  Proceed with cycle 1, day 1 of treatment today.  Patient has not yet received his prescription for capecitabine and will call clinic when he initiates treatment.  Return to clinic in 1 week for further evaluation and  consideration of cycle 1, day 8.  Appreciate clinical pharmacy input.   Pain: Patient has been on chronic narcotics for greater than 20 years.  Currently on MS Contin with as needed oxycodone.   Hypokalemia: Patient will receive 20 mEq IV potassium today and also was given oral supplementation. Leukocytosis: Likely reactive, monitor. Thrombocytosis: Likely reactive.   Patient expressed understanding and was in agreement with this plan. He also understands that He can call clinic at any time with any questions, concerns, or complaints.    Cancer Staging  Pancreatic adenocarcinoma [facility]) Staging form: Exocrine Pancreas, AJCC 8th Edition - Clinical stage from 07/16/2022: Stage IIA (cT3, cN0, cM0) - Signed by Jeralyn Ruths, MD on 07/16/2022 Stage prefix: Initial diagnosis Total positive nodes: 0   Jeralyn Ruths, MD   10/01/2022 9:58 AM

## 2022-10-01 NOTE — Patient Instructions (Signed)
[facility]  Discharge Instructions: Thank you for choosing [facility] to provide your oncology and hematology care.  If you have a lab appointment with the [facility], please go directly to the [facility] and check in at the registration area.  Wear comfortable clothing and clothing appropriate for easy access to any Portacath or PICC line.   We strive to give you quality time with your provider. You may need to reschedule your appointment if you arrive late (15 or more minutes).  Arriving late affects you and other patients whose appointments are after yours.  Also, if you miss three or more appointments without notifying the office, you may be dismissed from the clinic at the provider's discretion.      For prescription refill requests, have your pharmacy contact our office and allow 72 hours for refills to be completed.    Today you received the following chemotherapy and/or immunotherapy agents Gemcitabine   To help prevent nausea and vomiting after your treatment, we encourage you to take your nausea medication as directed.  BELOW ARE SYMPTOMS THAT SHOULD BE REPORTED IMMEDIATELY: *FEVER GREATER THAN 100.4 F (38 C) OR HIGHER *CHILLS OR SWEATING *NAUSEA AND VOMITING THAT IS NOT CONTROLLED WITH YOUR NAUSEA MEDICATION *UNUSUAL SHORTNESS OF BREATH *UNUSUAL BRUISING OR BLEEDING *URINARY PROBLEMS (pain or burning when urinating, or frequent urination) *BOWEL PROBLEMS (unusual diarrhea, constipation, pain near the anus) TENDERNESS IN MOUTH AND THROAT WITH OR WITHOUT PRESENCE OF ULCERS (sore throat, sores in mouth, or a toothache) UNUSUAL RASH, SWELLING OR PAIN  UNUSUAL VAGINAL DISCHARGE OR ITCHING   Items with * indicate a potential emergency and should be followed up as soon as possible or go to the Emergency Department if any problems should occur.  Please show the CHEMOTHERAPY ALERT CARD or IMMUNOTHERAPY ALERT CARD at check-in to  the Emergency Department and triage nurse.  Should you have questions after your visit or need to cancel or reschedule your appointment, please contact [facility]  [phone] and follow the prompts.  Office hours are 8:00 a.m. to 4:30 p.m. Monday - Friday. Please note that voicemails left after 4:00 p.m. may not be returned until the following business day.  We are closed weekends and major holidays. You have access to a nurse at all times for urgent questions. Please call the main number to the clinic [phone] and follow the prompts.  For any non-urgent questions, you may also contact your provider using MyChart. We now offer e-Visits for anyone 25 and older to request care online for non-urgent symptoms. For details visit [email or web address].   Also download the MyChart app! Go to the app store, search "MyChart", open the app, select [facility], and log in with your MyChart username and password.

## 2022-10-01 NOTE — Progress Notes (Signed)
Patient here today for follow up and treatment consideration regarding pancreatic cancer.

## 2022-10-04 ENCOUNTER — Telehealth: Payer: Self-pay

## 2022-10-04 NOTE — Telephone Encounter (Signed)
Telephone call to patient for follow up after receiving first infusion.   Patient states infusion went great.  States eating good and drinking plenty of fluids.   Denies any nausea or vomiting.  Encouraged patient to call for any concerns or questions. 

## 2022-10-05 ENCOUNTER — Ambulatory Visit: Payer: Medicare HMO | Admitting: Oncology

## 2022-10-05 ENCOUNTER — Encounter: Payer: Medicare HMO | Admitting: Hospice and Palliative Medicine

## 2022-10-05 ENCOUNTER — Encounter: Payer: Self-pay | Admitting: Oncology

## 2022-10-08 ENCOUNTER — Inpatient Hospital Stay: Payer: Medicare HMO | Attending: Oncology

## 2022-10-08 ENCOUNTER — Encounter: Payer: Self-pay | Admitting: Oncology

## 2022-10-08 ENCOUNTER — Inpatient Hospital Stay (HOSPITAL_BASED_OUTPATIENT_CLINIC_OR_DEPARTMENT_OTHER): Payer: Medicare HMO | Admitting: Oncology

## 2022-10-08 ENCOUNTER — Inpatient Hospital Stay: Payer: Medicare HMO

## 2022-10-08 DIAGNOSIS — Z5111 Encounter for antineoplastic chemotherapy: Secondary | ICD-10-CM | POA: Insufficient documentation

## 2022-10-08 DIAGNOSIS — Z79899 Other long term (current) drug therapy: Secondary | ICD-10-CM | POA: Insufficient documentation

## 2022-10-08 DIAGNOSIS — C259 Malignant neoplasm of pancreas, unspecified: Secondary | ICD-10-CM | POA: Diagnosis not present

## 2022-10-08 DIAGNOSIS — C252 Malignant neoplasm of tail of pancreas: Secondary | ICD-10-CM | POA: Diagnosis not present

## 2022-10-08 LAB — CBC WITH DIFFERENTIAL (CANCER CENTER ONLY)
Abs Immature Granulocytes: 0.02 10*3/uL (ref 0.00–0.07)
Basophils Absolute: 0.1 10*3/uL (ref 0.0–0.1)
Basophils Relative: 1 %
Eosinophils Absolute: 0.2 10*3/uL (ref 0.0–0.5)
Eosinophils Relative: 2 %
HCT: 35.4 % — ABNORMAL LOW (ref 39.0–52.0)
Hemoglobin: 11.5 g/dL — ABNORMAL LOW (ref 13.0–17.0)
Immature Granulocytes: 0 %
Lymphocytes Relative: 41 %
Lymphs Abs: 2.8 10*3/uL (ref 0.7–4.0)
MCH: 28.8 pg (ref 26.0–34.0)
MCHC: 32.5 g/dL (ref 30.0–36.0)
MCV: 88.7 fL (ref 80.0–100.0)
Monocytes Absolute: 0.6 10*3/uL (ref 0.1–1.0)
Monocytes Relative: 8 %
Neutro Abs: 3.3 10*3/uL (ref 1.7–7.7)
Neutrophils Relative %: 48 %
Platelet Count: 341 10*3/uL (ref 150–400)
RBC: 3.99 MIL/uL — ABNORMAL LOW (ref 4.22–5.81)
RDW: 14.5 % (ref 11.5–15.5)
WBC Count: 7 10*3/uL (ref 4.0–10.5)
nRBC: 1.1 % — ABNORMAL HIGH (ref 0.0–0.2)

## 2022-10-08 LAB — CMP (CANCER CENTER ONLY)
ALT: 13 U/L (ref 0–44)
AST: 16 U/L (ref 15–41)
Albumin: 3.7 g/dL (ref 3.5–5.0)
Alkaline Phosphatase: 62 U/L (ref 38–126)
Anion gap: 8 (ref 5–15)
BUN: 12 mg/dL (ref 6–20)
CO2: 25 mmol/L (ref 22–32)
Calcium: 8.6 mg/dL — ABNORMAL LOW (ref 8.9–10.3)
Chloride: 103 mmol/L (ref 98–111)
Creatinine: 0.7 mg/dL (ref 0.61–1.24)
GFR, Estimated: >60 mL/min (ref >60)
Glucose, Bld: 146 mg/dL — ABNORMAL HIGH (ref 70–99)
Potassium: 4.2 mmol/L (ref 3.5–5.1)
Sodium: 136 mmol/L (ref 135–145)
Total Bilirubin: 0.2 mg/dL — ABNORMAL LOW (ref 0.3–1.2)
Total Protein: 7 g/dL (ref 6.5–8.1)

## 2022-10-08 MED ORDER — PROCHLORPERAZINE MALEATE 10 MG PO TABS
10.0000 mg | ORAL_TABLET | Freq: Once | ORAL | Status: AC
  Administered 2022-10-08: 10 mg via ORAL

## 2022-10-08 MED ORDER — SODIUM CHLORIDE 0.9 % IV SOLN
Freq: Once | INTRAVENOUS | Status: AC
  Filled 2022-10-08: qty 250

## 2022-10-08 MED ORDER — HEPARIN SOD (PORK) LOCK FLUSH 100 UNIT/ML IV SOLN
500.0000 [IU] | Freq: Once | INTRAVENOUS | Status: AC | PRN
  Administered 2022-10-08: 500 [IU]
  Filled 2022-10-08: qty 5

## 2022-10-08 MED ORDER — SODIUM CHLORIDE 0.9 % IV SOLN
1000.0000 mg/m2 | Freq: Once | INTRAVENOUS | Status: AC
  Administered 2022-10-08: 1786 mg via INTRAVENOUS
  Filled 2022-10-08: qty 46.97

## 2022-10-08 NOTE — Progress Notes (Signed)
[facility] Clinical Social Work  Initial Assessment   David Harrell is a 62 y.o. year old male accompanied by spouse. Clinical Social Work was referred by medical provider for assessment of psychosocial needs.   SDOH (Social Determinants of Health) assessments performed: Yes   SDOH Screenings   Food Insecurity: No Food Insecurity (08/19/2022)  Housing: Low Risk  (08/19/2022)  Transportation Needs: No Transportation Needs (08/19/2022)  Utilities: Not At Risk (08/19/2022)  Depression (PHQ2-9): Low Risk  (07/16/2022)  Tobacco Use: High Risk (10/08/2022)     Distress Screen completed: No     No data to display            Family/Social Information:  Housing Arrangement: patient lives with his wife. Family members/support persons in your life? Family and Friends Transportation concerns: no  Employment: Legally disabled due to several back and neck surgeries. 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 Services Currently in place:  [facility].  Coping/ Adjustment to diagnosis: Patient understands treatment plan and what happens next? yes Concerns about diagnosis and/or treatment: How I will pay for the services I need Patient reported stressors: Finances Hopes and/or priorities: Family Patient enjoys time with family/ friends Current coping skills/ strengths: Capable of independent living , Manufacturing systems engineer , General fund of knowledge , and Supportive family/friends     SUMMARY: Current SDOH Barriers:  Financial constraints related to fixed income.  Clinical Social Work Clinical Goal(s):  Explore community resource options for unmet needs related to:  Financial Strain   Interventions: Discussed common feeling and emotions when being diagnosed with cancer, and the importance of support during  treatment Informed patient of the support team roles and support services at [facility] Provided CSW contact information and encouraged patient to call with any questions or concerns Provided patient with information about the [employer] and made referral to [employer].  Gave him grant resources for pancreatic cancer.  Provided information about [employer] $25 gift card and the [employer], which he plans on utilizing today.   Follow Up Plan: Patient will contact CSW with any support or resource needs Patient verbalizes understanding of plan: Yes    Dorothey Baseman, LCSW Clinical Social Worker [facility]

## 2022-10-08 NOTE — Progress Notes (Signed)
Nutrition Follow-up:  Patient with pancreatic cancer.  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  Patient receiving adjuvant gemcitabine and oral capecitabine.    Met with patient and wife during infusion. Reports that his appetite is good.  Biggest issue is fatigue.  Reports that he usually eats cereal for breakfast (10am). Sometimes has eggs and toast.  Dinner is around 6pm is usually meat and potatoes or vegetable. Snacks during breakfast and supper.  Snacks on pudding, ice cream, potato chips.  Says that he sometimes feels full with eating.      Medications: reviewed  Labs: reviewed  Anthropometrics:   Weight 145 lb 14.4 oz today 144 lb 12.8 oz on 7/11 143 lb on 5/30 prior to surgery  UBW of 170-175 lb [id number]    NUTRITION DIAGNOSIS: Inadequate oral intake continues   INTERVENTION:  Encouraged small frequent meals.  Encouraged foods high in protein Coupons for ensure/boost shakes given     MONITORING, EVALUATION, GOAL: weight trends, intake   NEXT VISIT: Friday, August 30 during infusion   B. Freida Busman, RD, LDN Registered Dietitian [phone]

## 2022-10-08 NOTE — Patient Instructions (Signed)

## 2022-10-08 NOTE — Progress Notes (Signed)
[facility]  [phone]) [phone] [phone]) [phone]  ID: David Harrell OB: 07-10-1960  MR#: [id number]  ION#:[id number]  Patient Care Team: Gracelyn Nurse, MD as PCP - General (Internal Medicine) Benita Gutter, RN as Oncology Nurse Navigator  CHIEF COMPLAINT: Pathologic stage IIa adenocarcinoma the pancreas.  INTERVAL HISTORY: Patient returns to clinic today for further evaluation and consideration of cycle 1, day 8 of gemcitabine and Xeloda.  He tolerated his first treatment well without significant side effects. He has no neurologic complaints.  He denies any recent fevers or illnesses.  He has a good appetite and denies weight loss.  He has no chest pain, shortness of breath, cough, or hemoptysis.  He has no abdominal pain.  He denies any nausea, vomiting, constipation, or diarrhea.  He has no urinary complaints.  Patient offers no specific complaints today.  REVIEW OF SYSTEMS:   Review of Systems  Constitutional: Negative.  Negative for fever, malaise/fatigue and weight loss.  Respiratory: Negative.  Negative for cough, hemoptysis and shortness of breath.   Cardiovascular: Negative.  Negative for chest pain and leg swelling.  Gastrointestinal: Negative.  Negative for abdominal pain.  Genitourinary: Negative.  Negative for dysuria.  Musculoskeletal: Negative.  Negative for back pain.  Skin: Negative.  Negative for rash.  Neurological: Negative.  Negative for dizziness, focal weakness, weakness and headaches.  Psychiatric/Behavioral: Negative.  The patient is not nervous/anxious.     As per HPI. Otherwise, a complete review of systems is negative.  PAST MEDICAL HISTORY: Past Medical History:  Diagnosis Date   Arthritis    GERD (gastroesophageal reflux disease)    Hiatal hernia    History of kidney stones    Hypertension    Pancreatic cancer (HCC) 2024    PAST SURGICAL HISTORY: Past Surgical History:  Procedure Laterality Date   ANTERIOR  CERVICAL DECOMP/DISCECTOMY FUSION     BACK SURGERY     x5   ESOPHAGOGASTRODUODENOSCOPY N/A 07/12/2022   Procedure: ESOPHAGOGASTRODUODENOSCOPY (EGD);  Surgeon: Lemar Lofty., MD;  Location: [facility];  Service: Gastroenterology;  Laterality: N/A;   ESOPHAGOGASTRODUODENOSCOPY (EGD) WITH PROPOFOL N/A 07/05/2022   Procedure: ESOPHAGOGASTRODUODENOSCOPY (EGD) WITH PROPOFOL;  Surgeon: [name], DO;  Location: [facility];  Service: Gastroenterology;  Laterality: N/A;   EUS N/A 07/12/2022   Procedure: UPPER ENDOSCOPIC ULTRASOUND (EUS) RADIAL;  Surgeon: Lemar Lofty., MD;  Location: [facility] ENDOSCOPY;  Service: Gastroenterology;  Laterality: N/A;   FINE NEEDLE ASPIRATION  07/12/2022   Procedure: FINE NEEDLE ASPIRATION;  Surgeon: Meridee Score, Netty Starring., MD;  Location: [facility];  Service: Gastroenterology;;   IR IMAGING GUIDED PORT INSERTION  09/29/2022   LAPAROSCOPY N/A 08/18/2022   Procedure: STAGING LAPAROSCOPY;  Surgeon: Fritzi Mandes, MD;  Location: [facility] OR;  Service: General;  Laterality: N/A;   OPERATIVE ULTRASOUND N/A 08/18/2022   Procedure: INTRAOPERATIVE ULTRASOUND;  Surgeon: Fritzi Mandes, MD;  Location: [facility] OR;  Service: General;  Laterality: N/A;   SHOULDER ARTHROSCOPY Bilateral    possibly metal in right shoulder, pt unsure   SPLENECTOMY, TOTAL N/A 08/18/2022   Procedure: SPLENECTOMY;  Surgeon: Fritzi Mandes, MD;  Location: [facility] OR;  Service: General;  Laterality: N/A;    FAMILY HISTORY: Family History  Problem Relation Age of Onset   Liver cancer Sister    Spina bifida Paternal Grandfather     ADVANCED DIRECTIVES (Y/N):  N  HEALTH MAINTENANCE: Social History   Tobacco Use   Smoking status: Every Day    Current  packs/day: 0.50    Average packs/day: 0.5 packs/day for 40.0 years (20.0 ttl pk-yrs)    Types: Cigarettes   Smokeless tobacco: Never  Vaping Use   Vaping status: Never Used  Substance Use Topics   Alcohol use: Not Currently    Drug use: Yes    Frequency: 1.0 times per week    Types: Marijuana     Colonoscopy:  PAP:  Bone density:  Lipid panel:  Allergies  Allergen Reactions   Aspirin     Acid reflux     Pregabalin     Dizziness   Tape     Blisters skin, paper tape is ok   Codeine Palpitations    headaches   Oxymorphone Rash   Penicillins Hives and Rash   Vicodin [Hydrocodone-Acetaminophen] Palpitations    Headaches     Current Outpatient Medications  Medication Sig Dispense Refill   ALPRAZolam (XANAX) 1 MG tablet Take 1 mg by mouth 2 (two) times daily.     budesonide-formoterol (SYMBICORT) 160-4.5 MCG/ACT inhaler Inhale 2 puffs into the lungs 2 (two) times daily as needed (shortness of breath).     capecitabine (XELODA) 500 MG tablet Take 3 tablets (1,500 mg total) by mouth 2 (two) times daily after a meal. Take for 21 days, then hold for 7 days. Repeat every 28 days. 126 tablet 1   lidocaine-prilocaine (EMLA) cream Apply to affected area once 30 g 3   lisinopril (ZESTRIL) 20 MG tablet Take 20 mg by mouth daily.     meloxicam (MOBIC) 15 MG tablet Take 15 mg by mouth daily.     methocarbamol (ROBAXIN) 500 MG tablet Take 2 tablets (1,000 mg total) by mouth every 6 (six) hours as needed. 60 tablet 0   morphine (MS CONTIN) 30 MG 12 hr tablet Take 1 tablet (30 mg total) by mouth every 8 (eight) hours. 90 tablet 0   omeprazole (PRILOSEC OTC) 20 MG tablet Take 20 mg by mouth daily.     ondansetron (ZOFRAN) 4 MG tablet Take 1 tablet (4 mg total) by mouth every 6 (six) hours as needed for nausea. 20 tablet 0   ondansetron (ZOFRAN) 8 MG tablet Take 1 tablet (8 mg total) by mouth every 8 (eight) hours as needed for nausea or vomiting. 60 tablet 1   Oxycodone HCl 10 MG TABS Take 1 tablet (10 mg total) by mouth every 4 (four) hours as needed (breakthrough pain). 20 tablet 0   pantoprazole (PROTONIX) 40 MG tablet Take 1 tablet (40 mg total) by mouth 2 (two) times daily. 60 tablet 0   polyethylene glycol  (MIRALAX / GLYCOLAX) 17 g packet Take 17 g by mouth daily. 14 each 0   potassium chloride SA (KLOR-CON M) 20 MEQ tablet Take 1 tablet (20 mEq total) by mouth 2 (two) times daily. 60 tablet 1   prochlorperazine (COMPAZINE) 10 MG tablet Take 1 tablet (10 mg total) by mouth every 6 (six) hours as needed for nausea or vomiting. 60 tablet 2   rosuvastatin (CRESTOR) 20 MG tablet Take 20 mg by mouth daily.     senna (SENOKOT) 8.6 MG TABS tablet Take 2 tablets (17.2 mg total) by mouth at bedtime. 120 tablet 0   acetaminophen (TYLENOL) 500 MG tablet Take 2 tablets (1,000 mg total) by mouth 3 (three) times daily. (Patient not taking: Reported on 10/08/2022) 30 tablet 0   No current facility-administered medications for this visit.   Facility-Administered Medications Ordered in Other Visits  Medication Dose Route  Frequency Provider Last Rate Last Admin   gemcitabine (GEMZAR) 1,786 mg in sodium chloride 0.9 % 250 mL chemo infusion  1,000 mg/m2 (Treatment Plan Recorded) Intravenous Once Jeralyn Ruths, MD        OBJECTIVE: Vitals:   10/08/22 0916  BP: 120/76  Pulse: 78  Resp: 18  Temp: 97.8 F (36.6 C)  SpO2: 94%      Body mass index is 22.18 kg/m.    ECOG FS:0 - Asymptomatic  General: Well-developed, well-nourished, no acute distress. Eyes: Pink conjunctiva, anicteric sclera. HEENT: Normocephalic, moist mucous membranes. Lungs: No audible wheezing or coughing. Heart: Regular rate and rhythm. Abdomen: Soft, nontender, no obvious distention. Musculoskeletal: No edema, cyanosis, or clubbing. Neuro: Alert, answering all questions appropriately. Cranial nerves grossly intact. Skin: No rashes or petechiae noted. Psych: Normal affect.  LAB RESULTS:  Lab Results  Component Value Date   NA 136 10/08/2022   K 4.2 10/08/2022   CL 103 10/08/2022   CO2 25 10/08/2022   GLUCOSE 146 (H) 10/08/2022   BUN 12 10/08/2022   CREATININE 0.70 10/08/2022   CALCIUM 8.6 (L) 10/08/2022   PROT 7.0  10/08/2022   ALBUMIN 3.7 10/08/2022   AST 16 10/08/2022   ALT 13 10/08/2022   ALKPHOS 62 10/08/2022   BILITOT 0.2 (L) 10/08/2022   GFRNONAA >60 10/08/2022   GFRAA >60 04/13/2018    Lab Results  Component Value Date   WBC 7.0 10/08/2022   NEUTROABS 3.3 10/08/2022   HGB 11.5 (L) 10/08/2022   HCT 35.4 (L) 10/08/2022   MCV 88.7 10/08/2022   PLT 341 10/08/2022     STUDIES: IR IMAGING GUIDED PORT INSERTION  Result Date: 09/29/2022 INDICATION: port placement for chemotherapy EXAM: Chest port placement using ultrasound and fluoroscopic guidance MEDICATIONS: Documented in the EMR ANESTHESIA/SEDATION: Moderate (conscious) sedation was employed during this procedure. A total of Versed 2.5 mg and Fentanyl 100 mcg was administered intravenously. Moderate Sedation Time: 27 minutes. The patient's level of consciousness and vital signs were monitored continuously by radiology nursing throughout the procedure under my direct supervision. FLUOROSCOPY TIME:  Fluoroscopy Time: 0.4 minutes (1 mGy) COMPLICATIONS: None immediate. PROCEDURE: Informed written consent was obtained from the patient after a thorough discussion of the procedural risks, benefits and alternatives. All questions were addressed. Maximal Sterile Barrier Technique was utilized including caps, mask, sterile gowns, sterile gloves, sterile drape, hand hygiene and skin antiseptic. A timeout was performed prior to the initiation of the procedure. The patient was placed supine on the exam table. The right neck and chest was prepped and draped in the standard sterile fashion. A preliminary ultrasound of the right neck was performed and demonstrates a patent right internal jugular vein. A permanent ultrasound image was stored in the electronic medical record. The overlying skin was anesthetized with 1% Lidocaine. Using ultrasound guidance, access was obtained into the right internal jugular vein using a 21 gauge micropuncture set. A wire was advanced  into the SVC, a short incision was made at the puncture site, and serial dilatation performed. Next, in an ipsilateral infraclavicular location, an incision was made at the site of the subcutaneous reservoir. Blunt dissection was used to open a pocket to contain the reservoir. A subcutaneous tunnel was then created from the port site to the puncture site. A(n) 8 Fr single lumen catheter was advanced through the tunnel. The catheter was attached to the port and this was placed in the subcutaneous pocket. Under fluoroscopic guidance, a peel away sheath was placed,  and the catheter was trimmed to the appropriate length and was advanced into the central veins. The tip of the catheter lies near the superior cavoatrial junction. The port flushes and aspirates appropriately. The port was flushed and locked with heparinized saline. The port pocket was closed in 2 layers using 3-0 and 4-0 Vicryl/absorbable suture. Dermabond was also applied to both incisions. The patient tolerated the procedure well and was transferred to recovery in stable condition. IMPRESSION: Successful right-sided chest port placement via the right internal jugular vein. The port is ready for immediate use. Electronically Signed   By: Olive Bass M.D.   On: 09/29/2022 15:40    ASSESSMENT: Pathologic stage IIa adenocarcinoma the pancreas.  PLAN:    Pathologic stage IIa adenocarcinoma the pancreas: Patient underwent surgical resection on August 18, 2022 confirming stage of disease.  Previously, PET scan did not reveal any metastatic disease.  Preoperatively patient's CA 19-9 was 130, currently within normal limits at 34.  Recommendation was to pursue adjuvant chemotherapy using gemcitabine and capecitabine.  Patient will receive weekly gemcitabine on days 1, 8, and 15 with oral capecitabine days 1 through 21.  Patient will have a 7-day break.  Plan on 4-6 cycles.  Proceed with cycle 1, day 8 of treatment today.  Return to clinic in 1 week for  further evaluation and consideration of cycle 1, day 15.  Appreciate clinical pharmacy input.   Pain: Well-controlled.  Patient has been on chronic narcotics for greater than 20 years.  Currently on MS Contin with as needed oxycodone.   Hypokalemia: Resolved.  Patient has discontinued oral potassium supplementation. Anemia: Mild, monitor. Leukocytosis: Resolved.   Thrombocytosis: Resolved.   Patient expressed understanding and was in agreement with this plan. He also understands that He can call clinic at any time with any questions, concerns, or complaints.    Cancer Staging  Pancreatic adenocarcinoma [facility]) Staging form: Exocrine Pancreas, AJCC 8th Edition - Clinical stage from 07/16/2022: Stage IIA (cT3, cN0, cM0) - Signed by Jeralyn Ruths, MD on 07/16/2022 Stage prefix: Initial diagnosis Total positive nodes: 0   Jeralyn Ruths, MD   10/08/2022 10:13 AM

## 2022-10-11 ENCOUNTER — Encounter: Payer: Self-pay | Admitting: Licensed Clinical Social Worker

## 2022-10-11 DIAGNOSIS — Z1379 Encounter for other screening for genetic and chromosomal anomalies: Secondary | ICD-10-CM | POA: Insufficient documentation

## 2022-10-12 ENCOUNTER — Encounter: Payer: Self-pay | Admitting: Oncology

## 2022-10-15 ENCOUNTER — Inpatient Hospital Stay: Payer: Medicare HMO

## 2022-10-15 ENCOUNTER — Other Ambulatory Visit: Payer: Self-pay

## 2022-10-15 ENCOUNTER — Encounter: Payer: Self-pay | Admitting: Nurse Practitioner

## 2022-10-15 ENCOUNTER — Inpatient Hospital Stay (HOSPITAL_BASED_OUTPATIENT_CLINIC_OR_DEPARTMENT_OTHER): Payer: Medicare HMO | Admitting: Hospice and Palliative Medicine

## 2022-10-15 ENCOUNTER — Inpatient Hospital Stay (HOSPITAL_BASED_OUTPATIENT_CLINIC_OR_DEPARTMENT_OTHER): Payer: Medicare HMO | Admitting: Nurse Practitioner

## 2022-10-15 VITALS — BP 112/75 | HR 76 | Temp 97.0°F | Resp 20

## 2022-10-15 DIAGNOSIS — C259 Malignant neoplasm of pancreas, unspecified: Secondary | ICD-10-CM

## 2022-10-15 DIAGNOSIS — Z5111 Encounter for antineoplastic chemotherapy: Secondary | ICD-10-CM | POA: Diagnosis not present

## 2022-10-15 DIAGNOSIS — Z79899 Other long term (current) drug therapy: Secondary | ICD-10-CM | POA: Diagnosis not present

## 2022-10-15 DIAGNOSIS — C252 Malignant neoplasm of tail of pancreas: Secondary | ICD-10-CM | POA: Diagnosis not present

## 2022-10-15 LAB — CMP (CANCER CENTER ONLY)
ALT: 14 U/L (ref 0–44)
AST: 19 U/L (ref 15–41)
Albumin: 3.7 g/dL (ref 3.5–5.0)
Alkaline Phosphatase: 63 U/L (ref 38–126)
Anion gap: 7 (ref 5–15)
BUN: 8 mg/dL (ref 6–20)
CO2: 27 mmol/L (ref 22–32)
Calcium: 8.5 mg/dL — ABNORMAL LOW (ref 8.9–10.3)
Chloride: 102 mmol/L (ref 98–111)
Creatinine: 0.76 mg/dL (ref 0.61–1.24)
GFR, Estimated: >60 mL/min (ref >60)
Glucose, Bld: 152 mg/dL — ABNORMAL HIGH (ref 70–99)
Potassium: 3.8 mmol/L (ref 3.5–5.1)
Sodium: 136 mmol/L (ref 135–145)
Total Bilirubin: 0.3 mg/dL (ref 0.3–1.2)
Total Protein: 7 g/dL (ref 6.5–8.1)

## 2022-10-15 LAB — CBC WITH DIFFERENTIAL (CANCER CENTER ONLY)
Abs Immature Granulocytes: 0.04 10*3/uL (ref 0.00–0.07)
Basophils Absolute: 0 10*3/uL (ref 0.0–0.1)
Basophils Relative: 1 %
Eosinophils Absolute: 0.1 10*3/uL (ref 0.0–0.5)
Eosinophils Relative: 2 %
HCT: 34.8 % — ABNORMAL LOW (ref 39.0–52.0)
Hemoglobin: 11.3 g/dL — ABNORMAL LOW (ref 13.0–17.0)
Immature Granulocytes: 1 %
Lymphocytes Relative: 58 %
Lymphs Abs: 3.7 10*3/uL (ref 0.7–4.0)
MCH: 29.3 pg (ref 26.0–34.0)
MCHC: 32.5 g/dL (ref 30.0–36.0)
MCV: 90.2 fL (ref 80.0–100.0)
Monocytes Absolute: 0.7 10*3/uL (ref 0.1–1.0)
Monocytes Relative: 12 %
Neutro Abs: 1.7 10*3/uL (ref 1.7–7.7)
Neutrophils Relative %: 26 %
Platelet Count: 251 10*3/uL (ref 150–400)
RBC: 3.86 MIL/uL — ABNORMAL LOW (ref 4.22–5.81)
RDW: 14.6 % (ref 11.5–15.5)
Smear Review: ADEQUATE
WBC Count: 6.3 10*3/uL (ref 4.0–10.5)
nRBC: 4.5 % — ABNORMAL HIGH (ref 0.0–0.2)

## 2022-10-15 MED ORDER — SODIUM CHLORIDE 0.9% FLUSH
10.0000 mL | Freq: Once | INTRAVENOUS | Status: AC
  Administered 2022-10-15: 10 mL via INTRAVENOUS
  Filled 2022-10-15: qty 10

## 2022-10-15 MED ORDER — HEPARIN SOD (PORK) LOCK FLUSH 100 UNIT/ML IV SOLN
500.0000 [IU] | Freq: Once | INTRAVENOUS | Status: AC
  Filled 2022-10-15: qty 5

## 2022-10-15 MED ORDER — HEPARIN SOD (PORK) LOCK FLUSH 100 UNIT/ML IV SOLN
INTRAVENOUS | Status: AC
  Administered 2022-10-15: 500 [IU] via INTRAVENOUS
  Filled 2022-10-15: qty 5

## 2022-10-15 MED ORDER — HEPARIN SOD (PORK) LOCK FLUSH 100 UNIT/ML IV SOLN
500.0000 [IU] | Freq: Once | INTRAVENOUS | Status: DC | PRN
  Filled 2022-10-15: qty 5

## 2022-10-15 MED ORDER — MORPHINE SULFATE ER 30 MG PO TBCR: 30.0000 mg | EXTENDED_RELEASE_TABLET | Freq: Three times a day (TID) | ORAL | 0 refills | Status: DC

## 2022-10-15 MED ORDER — SODIUM CHLORIDE 0.9 % IV SOLN
Freq: Once | INTRAVENOUS | Status: AC
  Filled 2022-10-15: qty 250

## 2022-10-15 MED ORDER — PROCHLORPERAZINE MALEATE 10 MG PO TABS
10.0000 mg | ORAL_TABLET | Freq: Once | ORAL | Status: AC
  Administered 2022-10-15: 10 mg via ORAL
  Filled 2022-10-15: qty 1

## 2022-10-15 MED ORDER — PANTOPRAZOLE SODIUM 40 MG PO TBEC: 40.0000 mg | DELAYED_RELEASE_TABLET | Freq: Every day | ORAL | 0 refills | Status: DC

## 2022-10-15 MED ORDER — SODIUM CHLORIDE 0.9 % IV SOLN
1000.0000 mg/m2 | Freq: Once | INTRAVENOUS | Status: AC
  Administered 2022-10-15: 1786 mg via INTRAVENOUS
  Filled 2022-10-15: qty 20.67

## 2022-10-15 MED ORDER — OMEPRAZOLE MAGNESIUM 20 MG PO TBEC: 20.0000 mg | DELAYED_RELEASE_TABLET | Freq: Every day | ORAL | 0 refills | Status: DC

## 2022-10-15 NOTE — Progress Notes (Signed)
Palliative Medicine [facility] [phone] [phone]) [phone]   Name: David Harrell Date: 10/15/2022 MRN: [id number]  DOB: November 19, 1960  Patient Care Team: Gracelyn Nurse, MD as PCP - General (Internal Medicine) Benita Gutter, RN as Oncology Nurse Navigator    REASON FOR CONSULTATION: David Harrell is a 62 y.o. 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  Patient has history of chronic pain on opioids for many years.  He was referred to palliative care to address goals and manage ongoing symptoms.  SOCIAL HISTORY:     reports that he has been smoking cigarettes. He has a 20 pack-year smoking history. He has never used smokeless tobacco. He reports that he does not currently use alcohol. He reports current drug use. Frequency: 1.00 time per week. Drug: Marijuana.  Patient is married and lives at home with his wife.  ADVANCE DIRECTIVES:  Does not have  CODE STATUS:   PAST MEDICAL HISTORY: Past Medical History:  Diagnosis Date   Arthritis    GERD (gastroesophageal reflux disease)    Hiatal hernia    History of kidney stones    Hypertension    Pancreatic cancer (HCC) 2024    PAST SURGICAL HISTORY:  Past Surgical History:  Procedure Laterality Date   ANTERIOR CERVICAL DECOMP/DISCECTOMY FUSION     BACK SURGERY     x5   ESOPHAGOGASTRODUODENOSCOPY N/A 07/12/2022   Procedure: ESOPHAGOGASTRODUODENOSCOPY (EGD);  Surgeon: Lemar Lofty., MD;  Location: [facility];  Service: Gastroenterology;  Laterality: N/A;   ESOPHAGOGASTRODUODENOSCOPY (EGD) WITH PROPOFOL N/A 07/05/2022   Procedure: ESOPHAGOGASTRODUODENOSCOPY (EGD) WITH PROPOFOL;  Surgeon: [name], DO;  Location: [facility] ENDOSCOPY;  Service: Gastroenterology;  Laterality: N/A;   EUS N/A 07/12/2022   Procedure: UPPER ENDOSCOPIC ULTRASOUND (EUS) RADIAL;   Surgeon: Lemar Lofty., MD;  Location: [facility] ENDOSCOPY;  Service: Gastroenterology;  Laterality: N/A;   FINE NEEDLE ASPIRATION  07/12/2022   Procedure: FINE NEEDLE ASPIRATION;  Surgeon: Meridee Score, Netty Starring., MD;  Location: [facility];  Service: Gastroenterology;;   IR IMAGING GUIDED PORT INSERTION  09/29/2022   LAPAROSCOPY N/A 08/18/2022   Procedure: STAGING LAPAROSCOPY;  Surgeon: Fritzi Mandes, MD;  Location: [facility] OR;  Service: General;  Laterality: N/A;   OPERATIVE ULTRASOUND N/A 08/18/2022   Procedure: INTRAOPERATIVE ULTRASOUND;  Surgeon: Fritzi Mandes, MD;  Location: [facility] OR;  Service: General;  Laterality: N/A;   SHOULDER ARTHROSCOPY Bilateral    possibly metal in right shoulder, pt unsure   SPLENECTOMY, TOTAL N/A 08/18/2022   Procedure: SPLENECTOMY;  Surgeon: Fritzi Mandes, MD;  Location: [facility] OR;  Service: General;  Laterality: N/A;    HEMATOLOGY/ONCOLOGY HISTORY:  Oncology History  Pancreatic adenocarcinoma (HCC)  07/16/2022 Initial Diagnosis   Pancreatic adenocarcinoma (HCC)   07/16/2022 Cancer Staging   Staging form: Exocrine Pancreas, AJCC 8th Edition - Clinical stage from 07/16/2022: Stage IIA (cT3, cN0, cM0) - Signed by Jeralyn Ruths, MD on 07/16/2022 Stage prefix: Initial diagnosis Total positive nodes: 0   10/01/2022 -  Chemotherapy   Patient is on Treatment Plan : PANCREAS Gemcitabine D1,8,15 (1000) + Capecitabine D1-21 q28d x 6 cycles     10/10/2022 Genetic Testing   Negative genetic testing. No pathogenic variants identified on the [facility] Common Hereditary Cancers+RNA panel. The report date is 10/10/2022.  The Common Hereditary Cancers Panel + RNA offered by [facility] includes sequencing and/or deletion duplication testing  of the following 48 genes: APC*, ATM*, AXIN2, BAP1, BARD1, BMPR1A, BRCA1, BRCA2, BRIP1, CDH1, CDK4, CDKN2A (p14ARF), CDKN2A (p16INK4a), CHEK2, CTNNA1, DICER1*, EPCAM*, FH*, GREM1*, HOXB13, KIT, MBD4, MEN1*, MLH1*, MSH2*, MSH3*, MSH6*, MUTYH,  NF1*, NTHL1, PALB2, PDGFRA, PMS2*, POLD1*, POLE, PTEN*, RAD51C, RAD51D, SDHA*, SDHB, SDHC*, SDHD, SMAD4, SMARCA4, STK11, TP53, TSC1*, TSC2, VHL.      ALLERGIES:  is allergic to aspirin, pregabalin, tape, codeine, oxymorphone, penicillins, and vicodin [hydrocodone-acetaminophen].  MEDICATIONS:  Current Outpatient Medications  Medication Sig Dispense Refill   acetaminophen (TYLENOL) 500 MG tablet Take 2 tablets (1,000 mg total) by mouth 3 (three) times daily. (Patient not taking: Reported on 10/15/2022) 30 tablet 0   ALPRAZolam (XANAX) 1 MG tablet Take 1 mg by mouth 2 (two) times daily.     budesonide-formoterol (SYMBICORT) 160-4.5 MCG/ACT inhaler Inhale 2 puffs into the lungs 2 (two) times daily as needed (shortness of breath).     capecitabine (XELODA) 500 MG tablet Take 3 tablets (1,500 mg total) by mouth 2 (two) times daily after a meal. Take for 21 days, then hold for 7 days. Repeat every 28 days. 126 tablet 1   lidocaine-prilocaine (EMLA) cream Apply to affected area once 30 g 3   lisinopril (ZESTRIL) 20 MG tablet Take 20 mg by mouth daily.     meloxicam (MOBIC) 15 MG tablet Take 15 mg by mouth daily.     methocarbamol (ROBAXIN) 500 MG tablet Take 2 tablets (1,000 mg total) by mouth every 6 (six) hours as needed. 60 tablet 0   [START ON 10/18/2022] morphine (MS CONTIN) 30 MG 12 hr tablet Take 1 tablet (30 mg total) by mouth every 8 (eight) hours. 90 tablet 0   omeprazole (PRILOSEC OTC) 20 MG tablet Take 1 tablet (20 mg total) by mouth daily. 90 tablet 0   ondansetron (ZOFRAN) 8 MG tablet Take 1 tablet (8 mg total) by mouth every 8 (eight) hours as needed for nausea or vomiting. 60 tablet 1   Oxycodone HCl 10 MG TABS Take 1 tablet (10 mg total) by mouth every 4 (four) hours as needed (breakthrough pain). 20 tablet 0   polyethylene glycol (MIRALAX / GLYCOLAX) 17 g packet Take 17 g by mouth daily. 14 each 0   prochlorperazine (COMPAZINE) 10 MG tablet Take 1 tablet (10 mg total) by mouth every 6  (six) hours as needed for nausea or vomiting. 60 tablet 2   rosuvastatin (CRESTOR) 20 MG tablet Take 20 mg by mouth daily.     senna (SENOKOT) 8.6 MG TABS tablet Take 2 tablets (17.2 mg total) by mouth at bedtime. 120 tablet 0   No current facility-administered medications for this visit.   Facility-Administered Medications Ordered in Other Visits  Medication Dose Route Frequency Provider Last Rate Last Admin   gemcitabine (GEMZAR) 1,786 mg in sodium chloride 0.9 % 250 mL chemo infusion  1,000 mg/m2 (Treatment Plan Recorded) Intravenous Once Jeralyn Ruths, MD       heparin lock flush 100 unit/mL  500 Units Intravenous Once Jeralyn Ruths, MD       heparin lock flush 100 unit/mL  500 Units Intracatheter Once PRN Jeralyn Ruths, MD        VITAL SIGNS: There were no vitals taken for this visit. There were no vitals filed for this visit.  Estimated body mass index is 22.18 kg/m as calculated from the following:   Height as of 09/29/22: 5\' 8"  (1.727 m).   Weight as of 10/08/22: 145 lb 14.4  oz (66.2 kg).  LABS: CBC:    Component Value Date/Time   WBC 6.3 10/15/2022 0854   WBC 9.5 09/16/2022 1202   HGB 11.3 (L) 10/15/2022 0854   HGB 15.3 06/30/2014 1750   HCT 34.8 (L) 10/15/2022 0854   HCT 44.3 06/30/2014 1750   PLT 251 10/15/2022 0854   PLT 226 06/30/2014 1750   MCV 90.2 10/15/2022 0854   MCV 88 06/30/2014 1750   NEUTROABS 1.7 10/15/2022 0854   LYMPHSABS 3.7 10/15/2022 0854   MONOABS 0.7 10/15/2022 0854   EOSABS 0.1 10/15/2022 0854   BASOSABS 0.0 10/15/2022 0854   Comprehensive Metabolic Panel:    Component Value Date/Time   NA 136 10/15/2022 0854   NA 139 06/30/2014 1750   K 3.8 10/15/2022 0854   K 3.3 (L) 06/30/2014 1750   CL 102 10/15/2022 0854   CL 105 06/30/2014 1750   CO2 27 10/15/2022 0854   CO2 20 (L) 06/30/2014 1750   BUN 8 10/15/2022 0854   BUN 10 06/30/2014 1750   CREATININE 0.76 10/15/2022 0854   CREATININE 0.85 06/30/2014 1750   GLUCOSE 152  (H) 10/15/2022 0854   GLUCOSE 115 (H) 06/30/2014 1750   CALCIUM 8.5 (L) 10/15/2022 0854   CALCIUM 8.9 06/30/2014 1750   AST 19 10/15/2022 0854   ALT 14 10/15/2022 0854   ALKPHOS 63 10/15/2022 0854   BILITOT 0.3 10/15/2022 0854   PROT 7.0 10/15/2022 0854   ALBUMIN 3.7 10/15/2022 0854    RADIOGRAPHIC STUDIES: IR IMAGING GUIDED PORT INSERTION  Result Date: 09/29/2022 INDICATION: port placement for chemotherapy EXAM: Chest port placement using ultrasound and fluoroscopic guidance MEDICATIONS: Documented in the EMR ANESTHESIA/SEDATION: Moderate (conscious) sedation was employed during this procedure. A total of Versed 2.5 mg and Fentanyl 100 mcg was administered intravenously. Moderate Sedation Time: 27 minutes. The patient's level of consciousness and vital signs were monitored continuously by radiology nursing throughout the procedure under my direct supervision. FLUOROSCOPY TIME:  Fluoroscopy Time: 0.4 minutes (1 mGy) COMPLICATIONS: None immediate. PROCEDURE: Informed written consent was obtained from the patient after a thorough discussion of the procedural risks, benefits and alternatives. All questions were addressed. Maximal Sterile Barrier Technique was utilized including caps, mask, sterile gowns, sterile gloves, sterile drape, hand hygiene and skin antiseptic. A timeout was performed prior to the initiation of the procedure. The patient was placed supine on the exam table. The right neck and chest was prepped and draped in the standard sterile fashion. A preliminary ultrasound of the right neck was performed and demonstrates a patent right internal jugular vein. A permanent ultrasound image was stored in the electronic medical record. The overlying skin was anesthetized with 1% Lidocaine. Using ultrasound guidance, access was obtained into the right internal jugular vein using a 21 gauge micropuncture set. A wire was advanced into the SVC, a short incision was made at the puncture site, and  serial dilatation performed. Next, in an ipsilateral infraclavicular location, an incision was made at the site of the subcutaneous reservoir. Blunt dissection was used to open a pocket to contain the reservoir. A subcutaneous tunnel was then created from the port site to the puncture site. A(n) 8 Fr single lumen catheter was advanced through the tunnel. The catheter was attached to the port and this was placed in the subcutaneous pocket. Under fluoroscopic guidance, a peel away sheath was placed, and the catheter was trimmed to the appropriate length and was advanced into the central veins. The tip of the catheter lies near  the superior cavoatrial junction. The port flushes and aspirates appropriately. The port was flushed and locked with heparinized saline. The port pocket was closed in 2 layers using 3-0 and 4-0 Vicryl/absorbable suture. Dermabond was also applied to both incisions. The patient tolerated the procedure well and was transferred to recovery in stable condition. IMPRESSION: Successful right-sided chest port placement via the right internal jugular vein. The port is ready for immediate use. Electronically Signed   By: Olive Bass M.D.   On: 09/29/2022 15:40    PERFORMANCE STATUS (ECOG) : 1 - Symptomatic but completely ambulatory  Review of Systems Unless otherwise noted, a complete review of systems is negative.  Physical Exam General: NAD Pulmonary: unlabored Extremities: no edema, no joint deformities Skin: no rashes Neurological: Weakness but otherwise nonfocal  IMPRESSION: Follow-up visit.    Patient is now status post resection of pancreatic mass.  He is on neoadjuvant gemcitabine and Xeloda.  Patient appears to be tolerating cancer treatment well and denies significant symptomatic burden at present.  He does endorse chronic back pain for which he has been managed on opioids for many years.  Prior to cancer diagnosis, opioids were prescribed by his PCP Dr. Letitia Libra.  Patient  understands that at some point we will likely transfer opioid prescribing back to PCP.  Patient does request refill of MS Contin today.  He denies any adverse effects.  Patient is interested in establishing ACP documents.  I also sent him home with a MOST form today.  Will refer to LCSW ACP clinic.  PLAN: -Continue plan for workup -Continue MS Contin 30 mg every 8 hours  -Continue oxycodone as needed for breakthrough pain -Daily bowel regimen -ACP/MOST form reviewed -Referral to SW for ACP clinic -Follow-up telephone visit 1-2 months   Patient expressed understanding and was in agreement with this plan. He also understands that He can call the clinic at any time with any questions, concerns, or complaints.     Time Total: 15 minutes  Visit consisted of counseling and education dealing with the complex and emotionally intense issues of symptom management and palliative care in the setting of serious and potentially life-threatening illness.Greater than 50%  of this time was spent counseling and coordinating care related to the above assessment and plan.  Signed by: Laurette Schimke, PhD, NP-C

## 2022-10-15 NOTE — Patient Instructions (Signed)
[facility]  Discharge Instructions: Thank you for choosing [facility] to provide your oncology and hematology care.  If you have a lab appointment with the Cancer Center, please go directly to the Cancer Center and check in at the registration area.  Wear comfortable clothing and clothing appropriate for easy access to any Portacath or PICC line.   We strive to give you quality time with your provider. You may need to reschedule your appointment if you arrive late (15 or more minutes).  Arriving late affects you and other patients whose appointments are after yours.  Also, if you miss three or more appointments without notifying the office, you may be dismissed from the clinic at the provider's discretion.      For prescription refill requests, have your pharmacy contact our office and allow 72 hours for refills to be completed.    Today you received the following chemotherapy and/or immunotherapy agents Gemzar       To help prevent nausea and vomiting after your treatment, we encourage you to take your nausea medication as directed.  BELOW ARE SYMPTOMS THAT SHOULD BE REPORTED IMMEDIATELY: *FEVER GREATER THAN 100.4 F (38 C) OR HIGHER *CHILLS OR SWEATING *NAUSEA AND VOMITING THAT IS NOT CONTROLLED WITH YOUR NAUSEA MEDICATION *UNUSUAL SHORTNESS OF BREATH *UNUSUAL BRUISING OR BLEEDING *URINARY PROBLEMS (pain or burning when urinating, or frequent urination) *BOWEL PROBLEMS (unusual diarrhea, constipation, pain near the anus) TENDERNESS IN MOUTH AND THROAT WITH OR WITHOUT PRESENCE OF ULCERS (sore throat, sores in mouth, or a toothache) UNUSUAL RASH, SWELLING OR PAIN  UNUSUAL VAGINAL DISCHARGE OR ITCHING   Items with * indicate a potential emergency and should be followed up as soon as possible or go to the Emergency Department if any problems should occur.  Please show the CHEMOTHERAPY ALERT CARD or IMMUNOTHERAPY ALERT CARD at check-in to  the Emergency Department and triage nurse.  Should you have questions after your visit or need to cancel or reschedule your appointment, please contact [facility]  [phone] and follow the prompts.  Office hours are 8:00 a.m. to 4:30 p.m. Monday - Friday. Please note that voicemails left after 4:00 p.m. may not be returned until the following business day.  We are closed weekends and major holidays. You have access to a nurse at all times for urgent questions. Please call the main number to the clinic [phone] and follow the prompts.  For any non-urgent questions, you may also contact your provider using MyChart. We now offer e-Visits for anyone 18 and older to request care online for non-urgent symptoms. For details visit [email or web address].   Also download the MyChart app! Go to the app store, search "MyChart", open the app, select [facility], and log in with your MyChart username and password.    

## 2022-10-15 NOTE — Progress Notes (Signed)
[facility]  [phone] [phone]  ID: David Harrell OB: 04/15/1960  MR#: [id number]  QIO#:[id number]  Patient Care Team: Gracelyn Nurse, MD as PCP - General (Internal Medicine) Benita Gutter, RN as Oncology Nurse Navigator  CHIEF COMPLAINT: Pathologic stage IIa adenocarcinoma the pancreas  INTERVAL HISTORY: Patient returns to clinic today for further evaluation and consideration  of cycle 1, day 15 of gemcitabine and Xeloda.  Continues to tolerate treatment well with few side effects. Has ongoing pain at his surgical site which is well controlled with pain regimen. Had some mild nausea for day or so after treatment which resolved with antiemetics. Complains of left ankle pain that started after his last treatment.  No diarrhea or constipation. He's gaining weight and appetite is good. He has no chest pain, shortness of breath, cough, or hemoptysis. He denies any nausea, vomiting, constipation, or diarrhea.  He has no urinary complaints.  Patient offers no specific complaints today.   REVIEW OF SYSTEMS:   Review of Systems  Constitutional: Negative.  Negative for fever, malaise/fatigue and weight loss.  Respiratory: Negative.  Negative for cough, hemoptysis and shortness of breath.   Cardiovascular: Negative.  Negative for chest pain and leg swelling.  Gastrointestinal:  Positive for heartburn and nausea. Negative for abdominal pain, blood in stool, constipation, diarrhea, melena and vomiting.  Genitourinary: Negative.  Negative for dysuria and hematuria.  Musculoskeletal:  Positive for back pain and joint pain. Negative for falls, myalgias and neck pain.  Skin: Negative.  Negative for rash.  Neurological: Negative.  Negative for dizziness, focal weakness, loss of consciousness, weakness and headaches.  Endo/Heme/Allergies:  Does not bruise/bleed easily.  Psychiatric/Behavioral:  Positive for substance abuse. Negative for depression. The  patient is not nervous/anxious.   As per HPI. Otherwise, a complete review of systems is negative.   PAST MEDICAL HISTORY: Past Medical History:  Diagnosis Date   Arthritis    GERD (gastroesophageal reflux disease)    Hiatal hernia    History of kidney stones    Hypertension    Pancreatic cancer (HCC) 2024    PAST SURGICAL HISTORY: Past Surgical History:  Procedure Laterality Date   ANTERIOR CERVICAL DECOMP/DISCECTOMY FUSION     BACK SURGERY     x5   ESOPHAGOGASTRODUODENOSCOPY N/A 07/12/2022   Procedure: ESOPHAGOGASTRODUODENOSCOPY (EGD);  Surgeon: Lemar Lofty., MD;  Location: [facility];  Service: Gastroenterology;  Laterality: N/A;   ESOPHAGOGASTRODUODENOSCOPY (EGD) WITH PROPOFOL N/A 07/05/2022   Procedure: ESOPHAGOGASTRODUODENOSCOPY (EGD) WITH PROPOFOL;  Surgeon: [name], DO;  Location: [facility];  Service: Gastroenterology;  Laterality: N/A;   EUS N/A 07/12/2022   Procedure: UPPER ENDOSCOPIC ULTRASOUND (EUS) RADIAL;  Surgeon: Lemar Lofty., MD;  Location: [facility]COPY;  Service: Gastroenterology;  Laterality: N/A;   FINE NEEDLE ASPIRATION  07/12/2022   Procedure: FINE NEEDLE ASPIRATION;  Surgeon: Meridee Score Netty Starring., MD;  Location: [facility];  Service: Gastroenterology;;   IR IMAGING GUIDED PORT INSERTION  09/29/2022   LAPAROSCOPY N/A 08/18/2022   Procedure: STAGING LAPAROSCOPY;  Surgeon: Fritzi Mandes, MD;  Location: [facility] OR;  Service: General;  Laterality: N/A;   OPERATIVE ULTRASOUND N/A 08/18/2022   Procedure: INTRAOPERATIVE ULTRASOUND;  Surgeon: Fritzi Mandes, MD;  Location: [facility] OR;  Service: General;  Laterality: N/A;   SHOULDER ARTHROSCOPY Bilateral    possibly metal in right shoulder, pt unsure   SPLENECTOMY, TOTAL N/A 08/18/2022   Procedure: SPLENECTOMY;  Surgeon: Fritzi Mandes, MD;  Location: [facility] OR;  Service: General;  Laterality: N/A;    FAMILY HISTORY: Family History  Problem Relation Age of Onset   Liver cancer  Sister    Spina bifida Paternal Grandfather     ADVANCED DIRECTIVES (Y/N):  N  HEALTH MAINTENANCE: Social History   Tobacco Use   Smoking status: Every Day    Current packs/day: 0.50    Average packs/day: 0.5 packs/day for 40.0 years (20.0 ttl pk-yrs)    Types: Cigarettes   Smokeless tobacco: Never  Vaping Use   Vaping status: Never Used  Substance Use Topics   Alcohol use: Not Currently   Drug use: Yes    Frequency: 1.0 times per week    Types: Marijuana    Colonoscopy:  PAP:  Bone density:  Lipid panel:  Allergies  Allergen Reactions   Aspirin     Acid reflux     Pregabalin     Dizziness   Tape     Blisters skin, paper tape is ok   Codeine Palpitations    headaches   Oxymorphone Rash   Penicillins Hives and Rash   Vicodin [Hydrocodone-Acetaminophen] Palpitations    Headaches     Current Outpatient Medications  Medication Sig Dispense Refill   ALPRAZolam (XANAX) 1 MG tablet Take 1 mg by mouth 2 (two) times daily.     budesonide-formoterol (SYMBICORT) 160-4.5 MCG/ACT inhaler Inhale 2 puffs into the lungs 2 (two) times daily as needed (shortness of breath).     capecitabine (XELODA) 500 MG tablet Take 3 tablets (1,500 mg total) by mouth 2 (two) times daily after a meal. Take for 21 days, then hold for 7 days. Repeat every 28 days. 126 tablet 1   lidocaine-prilocaine (EMLA) cream Apply to affected area once 30 g 3   lisinopril (ZESTRIL) 20 MG tablet Take 20 mg by mouth daily.     meloxicam (MOBIC) 15 MG tablet Take 15 mg by mouth daily.     methocarbamol (ROBAXIN) 500 MG tablet Take 2 tablets (1,000 mg total) by mouth every 6 (six) hours as needed. 60 tablet 0   morphine (MS CONTIN) 30 MG 12 hr tablet Take 1 tablet (30 mg total) by mouth every 8 (eight) hours. 90 tablet 0   omeprazole (PRILOSEC OTC) 20 MG tablet Take 20 mg by mouth daily.     ondansetron (ZOFRAN) 8 MG tablet Take 1 tablet (8 mg total) by mouth every 8 (eight) hours as needed for nausea or  vomiting. 60 tablet 1   Oxycodone HCl 10 MG TABS Take 1 tablet (10 mg total) by mouth every 4 (four) hours as needed (breakthrough pain). 20 tablet 0   pantoprazole (PROTONIX) 40 MG tablet Take 1 tablet (40 mg total) by mouth 2 (two) times daily. 60 tablet 0   polyethylene glycol (MIRALAX / GLYCOLAX) 17 g packet Take 17 g by mouth daily. 14 each 0   prochlorperazine (COMPAZINE) 10 MG tablet Take 1 tablet (10 mg total) by mouth every 6 (six) hours as needed for nausea or vomiting. 60 tablet 2   rosuvastatin (CRESTOR) 20 MG tablet Take 20 mg by mouth daily.     senna (SENOKOT) 8.6 MG TABS tablet Take 2 tablets (17.2 mg total) by mouth at bedtime. 120 tablet 0   acetaminophen (TYLENOL) 500 MG tablet Take 2 tablets (1,000 mg total) by mouth 3 (three) times daily. (Patient not taking: Reported on 10/15/2022) 30 tablet 0   ondansetron (ZOFRAN) 4 MG tablet Take 1 tablet (4 mg total) by mouth  every 6 (six) hours as needed for nausea. (Patient not taking: Reported on 10/15/2022) 20 tablet 0   No current facility-administered medications for this visit.   Facility-Administered Medications Ordered in Other Visits  Medication Dose Route Frequency Provider Last Rate Last Admin   heparin lock flush 100 unit/mL  500 Units Intravenous Once Jeralyn Ruths, MD        OBJECTIVE: Vitals:   10/15/22 0900  BP: 112/75  Pulse: 76  Resp: 20  Temp: (!) 97 F (36.1 C)      There is no height or weight on file to calculate BMI.    ECOG FS:0 - Asymptomatic  General: Well-developed, well-nourished, no acute distress. Accompanied.  Eyes: Pink conjunctiva, anicteric sclera. HEENT: Normocephalic, moist mucous membranes. Lungs: No audible wheezing or coughing. Heart: Regular rate and rhythm. Abdomen: Soft, nontender, no obvious distention. Musculoskeletal: No edema, cyanosis, or clubbing. Neuro: Alert, answering all questions appropriately. Skin: No rashes or petechiae noted. Psych: Normal affect.  LAB  RESULTS: Lab Results  Component Value Date   NA 136 10/15/2022   K 3.8 10/15/2022   CL 102 10/15/2022   CO2 27 10/15/2022   GLUCOSE 152 (H) 10/15/2022   BUN 8 10/15/2022   CREATININE 0.76 10/15/2022   CALCIUM 8.5 (L) 10/15/2022   PROT 7.0 10/15/2022   ALBUMIN 3.7 10/15/2022   AST 19 10/15/2022   ALT 14 10/15/2022   ALKPHOS 63 10/15/2022   BILITOT 0.3 10/15/2022   GFRNONAA >60 10/15/2022   GFRAA >60 04/13/2018    Lab Results  Component Value Date   WBC 6.3 10/15/2022   NEUTROABS 1.7 10/15/2022   HGB 11.3 (L) 10/15/2022   HCT 34.8 (L) 10/15/2022   MCV 90.2 10/15/2022   PLT 251 10/15/2022     STUDIES: IR IMAGING GUIDED PORT INSERTION  Result Date: 09/29/2022 INDICATION: port placement for chemotherapy EXAM: Chest port placement using ultrasound and fluoroscopic guidance MEDICATIONS: Documented in the EMR ANESTHESIA/SEDATION: Moderate (conscious) sedation was employed during this procedure. A total of Versed 2.5 mg and Fentanyl 100 mcg was administered intravenously. Moderate Sedation Time: 27 minutes. The patient's level of consciousness and vital signs were monitored continuously by radiology nursing throughout the procedure under my direct supervision. FLUOROSCOPY TIME:  Fluoroscopy Time: 0.4 minutes (1 mGy) COMPLICATIONS: None immediate. PROCEDURE: Informed written consent was obtained from the patient after a thorough discussion of the procedural risks, benefits and alternatives. All questions were addressed. Maximal Sterile Barrier Technique was utilized including caps, mask, sterile gowns, sterile gloves, sterile drape, hand hygiene and skin antiseptic. A timeout was performed prior to the initiation of the procedure. The patient was placed supine on the exam table. The right neck and chest was prepped and draped in the standard sterile fashion. A preliminary ultrasound of the right neck was performed and demonstrates a patent right internal jugular vein. A permanent ultrasound  image was stored in the electronic medical record. The overlying skin was anesthetized with 1% Lidocaine. Using ultrasound guidance, access was obtained into the right internal jugular vein using a 21 gauge micropuncture set. A wire was advanced into the SVC, a short incision was made at the puncture site, and serial dilatation performed. Next, in an ipsilateral infraclavicular location, an incision was made at the site of the subcutaneous reservoir. Blunt dissection was used to open a pocket to contain the reservoir. A subcutaneous tunnel was then created from the port site to the puncture site. A(n) 8 Fr single lumen catheter was advanced through  the tunnel. The catheter was attached to the port and this was placed in the subcutaneous pocket. Under fluoroscopic guidance, a peel away sheath was placed, and the catheter was trimmed to the appropriate length and was advanced into the central veins. The tip of the catheter lies near the superior cavoatrial junction. The port flushes and aspirates appropriately. The port was flushed and locked with heparinized saline. The port pocket was closed in 2 layers using 3-0 and 4-0 Vicryl/absorbable suture. Dermabond was also applied to both incisions. The patient tolerated the procedure well and was transferred to recovery in stable condition. IMPRESSION: Successful right-sided chest port placement via the right internal jugular vein. The port is ready for immediate use. Electronically Signed   By: Olive Bass M.D.   On: 09/29/2022 15:40    ASSESSMENT: Pathologic stage IIa adenocarcinoma the pancreas.  PLAN:    Pathologic stage IIa adenocarcinoma the pancreas: Patient underwent surgical resection on August 18, 2022 confirming stage of disease.  Previously, PET scan did not reveal any metastatic disease.  Preoperatively patient's CA 19-9 was 130, currently within normal limits at 34.  Recommendation was to pursue adjuvant chemotherapy using gemcitabine and capecitabine.   Patient will receive weekly gemcitabine on days 1, 8, and 15 with oral capecitabine days 1 through 21.  Patient will have a 7-day break.  Plan on 4-6 cycles.  Proceed with cycle 1, day 8 of treatment today.  Return to clinic in 1 week for further evaluation and consideration of cycle 1, day 15.  Appreciate clinical pharmacy input.   Chronic Pain: he has history of chronic pain and has been on chronic narcotics for greater than 20 years.  Cancer related pain- Well-controlled. Continues to have some abdominal pain post operatively. Would hope that with treatment and improved disease control we would be able to wean of the MS Contin. He requests refill today which will be provided. PDMP reviewed. Next fill on 8/12.  Hypokalemia: Resolved. Patient has discontinued oral potassium supplementation. Anemia: Mild, monitor. Leukocytosis: Resolved.   Thrombocytosis: Resolved. Dyspepsia- chronic. Per patient, hx of hiatal hernia however, I don't see any evidence of this on previous imaging studies. He can continue otc omeprazole 20 mg.  Left ankle pain- unlikely related to treatment. Recommended evaluation with pcp.  Tobacco use- encouraged cessation.  Goals of care- treatment given with curative intent. He has established care with Josh who will see him today for follow up as well.   Disposition:  Treatment today Follow up as scheduled for consideration of day 1 cycle 2 on 8/23.   Patient expressed understanding and was in agreement with this plan. He also understands that He can call clinic at any time with any questions, concerns, or complaints.    Cancer Staging  Pancreatic adenocarcinoma [facility]) Staging form: Exocrine Pancreas, AJCC 8th Edition - Clinical stage from 07/16/2022: Stage IIA (cT3, cN0, cM0) - Signed by Jeralyn Ruths, MD on 07/16/2022 Stage prefix: Initial diagnosis Total positive nodes: 0   Alinda Dooms, NP   10/15/2022

## 2022-10-18 ENCOUNTER — Inpatient Hospital Stay: Payer: Medicare HMO

## 2022-10-18 NOTE — Progress Notes (Signed)
[facility] CSW Progress Note  Clinical Child psychotherapist contacted patient by phone to assess needs.  Spoke with patient's spouse, Mare Loan, due to patient sleeping.  Informed her of referral from NPs regarding Advance Directives.  She agreed.  Appointment scheduled for Thursday, August 22, at 11am.    Dorothey Baseman, LCSW Clinical Social Worker [facility]

## 2022-10-19 ENCOUNTER — Other Ambulatory Visit (HOSPITAL_COMMUNITY): Payer: Self-pay

## 2022-10-19 NOTE — Progress Notes (Signed)
Called to speak with Maurine Minister about the [employer], provided my number to wife, they are going to call me back when they are able to discuss further.

## 2022-10-21 ENCOUNTER — Other Ambulatory Visit: Payer: Self-pay

## 2022-10-25 ENCOUNTER — Other Ambulatory Visit: Payer: Self-pay

## 2022-10-25 ENCOUNTER — Encounter: Payer: Self-pay | Admitting: Oncology

## 2022-10-27 ENCOUNTER — Other Ambulatory Visit (HOSPITAL_COMMUNITY): Payer: Self-pay

## 2022-10-28 ENCOUNTER — Inpatient Hospital Stay: Payer: Medicare HMO

## 2022-10-28 NOTE — Progress Notes (Signed)
[facility] Healthcare Advance Directives Clinical Social Work  Patient presented to Advance Directives Clinic  to review and complete healthcare advance directives.  Clinical Social Worker met with patient and his wife, David Harrell.  The patient designated David Harrell as their primary healthcare agent and David Harrell as their secondary agent.  Patient also completed healthcare living will.    Documents were notarized and copies made for patient/family. Clinical Social Worker will send documents to medical records to be scanned into patient's chart. Clinical Social Worker encouraged patient/family to contact with any additional questions or concerns.   Dorothey Baseman, LCSW Clinical Social Worker [facility]

## 2022-10-28 NOTE — Progress Notes (Signed)
Spoke with Maurine Minister regarding the [employer], he will be in today to sign and bring in proof of income.

## 2022-10-29 ENCOUNTER — Inpatient Hospital Stay (HOSPITAL_BASED_OUTPATIENT_CLINIC_OR_DEPARTMENT_OTHER): Payer: Medicare HMO | Admitting: Oncology

## 2022-10-29 ENCOUNTER — Inpatient Hospital Stay: Payer: Medicare HMO

## 2022-10-29 ENCOUNTER — Encounter: Payer: Self-pay | Admitting: Oncology

## 2022-10-29 VITALS — BP 132/75 | HR 62 | Temp 98.0°F | Resp 18

## 2022-10-29 DIAGNOSIS — C259 Malignant neoplasm of pancreas, unspecified: Secondary | ICD-10-CM

## 2022-10-29 DIAGNOSIS — Z79899 Other long term (current) drug therapy: Secondary | ICD-10-CM | POA: Diagnosis not present

## 2022-10-29 DIAGNOSIS — Z5111 Encounter for antineoplastic chemotherapy: Secondary | ICD-10-CM | POA: Diagnosis not present

## 2022-10-29 DIAGNOSIS — C252 Malignant neoplasm of tail of pancreas: Secondary | ICD-10-CM | POA: Diagnosis not present

## 2022-10-29 LAB — CBC WITH DIFFERENTIAL (CANCER CENTER ONLY)
Abs Immature Granulocytes: 0.03 10*3/uL (ref 0.00–0.07)
Basophils Absolute: 0.1 10*3/uL (ref 0.0–0.1)
Basophils Relative: 1 %
Eosinophils Absolute: 0.3 10*3/uL (ref 0.0–0.5)
Eosinophils Relative: 3 %
HCT: 38.2 % — ABNORMAL LOW (ref 39.0–52.0)
Hemoglobin: 12.7 g/dL — ABNORMAL LOW (ref 13.0–17.0)
Immature Granulocytes: 0 %
Lymphocytes Relative: 35 %
Lymphs Abs: 3.1 10*3/uL (ref 0.7–4.0)
MCH: 29.9 pg (ref 26.0–34.0)
MCHC: 33.2 g/dL (ref 30.0–36.0)
MCV: 89.9 fL (ref 80.0–100.0)
Monocytes Absolute: 1.5 10*3/uL — ABNORMAL HIGH (ref 0.1–1.0)
Monocytes Relative: 17 %
Neutro Abs: 3.8 10*3/uL (ref 1.7–7.7)
Neutrophils Relative %: 44 %
Platelet Count: 446 10*3/uL — ABNORMAL HIGH (ref 150–400)
RBC: 4.25 MIL/uL (ref 4.22–5.81)
RDW: 18.3 % — ABNORMAL HIGH (ref 11.5–15.5)
WBC Count: 8.8 10*3/uL (ref 4.0–10.5)
nRBC: 0 % (ref 0.0–0.2)

## 2022-10-29 LAB — CMP (CANCER CENTER ONLY)
ALT: 21 U/L (ref 0–44)
AST: 21 U/L (ref 15–41)
Albumin: 4.2 g/dL (ref 3.5–5.0)
Alkaline Phosphatase: 68 U/L (ref 38–126)
Anion gap: 8 (ref 5–15)
BUN: 14 mg/dL (ref 6–20)
CO2: 25 mmol/L (ref 22–32)
Calcium: 8.9 mg/dL (ref 8.9–10.3)
Chloride: 105 mmol/L (ref 98–111)
Creatinine: 0.67 mg/dL (ref 0.61–1.24)
GFR, Estimated: >60 mL/min (ref >60)
Glucose, Bld: 154 mg/dL — ABNORMAL HIGH (ref 70–99)
Potassium: 3.8 mmol/L (ref 3.5–5.1)
Sodium: 138 mmol/L (ref 135–145)
Total Bilirubin: 0.3 mg/dL (ref 0.3–1.2)
Total Protein: 7.3 g/dL (ref 6.5–8.1)

## 2022-10-29 MED ORDER — SODIUM CHLORIDE 0.9 % IV SOLN
1000.0000 mg/m2 | Freq: Once | INTRAVENOUS | Status: AC
  Administered 2022-10-29: 1786 mg via INTRAVENOUS
  Filled 2022-10-29: qty 46.97

## 2022-10-29 MED ORDER — SODIUM CHLORIDE 0.9 % IV SOLN
Freq: Once | INTRAVENOUS | Status: AC
  Filled 2022-10-29: qty 250

## 2022-10-29 MED ORDER — PROCHLORPERAZINE MALEATE 10 MG PO TABS
10.0000 mg | ORAL_TABLET | Freq: Once | ORAL | Status: AC
  Administered 2022-10-29: 10 mg via ORAL
  Filled 2022-10-29: qty 1

## 2022-10-29 MED ORDER — HEPARIN SOD (PORK) LOCK FLUSH 100 UNIT/ML IV SOLN
500.0000 [IU] | Freq: Once | INTRAVENOUS | Status: AC | PRN
  Administered 2022-10-29: 500 [IU]
  Filled 2022-10-29: qty 5

## 2022-10-29 NOTE — Progress Notes (Signed)
Patient here for oncology follow-up appointment, reports concerns of pain in port some redness and fever couple of days

## 2022-10-29 NOTE — Progress Notes (Signed)
[facility]  Telephone:(336) [phone] Fax:(336) [phone]  ID: David Harrell OB: Jul 29, 1960  MR#: [id number]  AOZ#:[id number]  Patient Care Team: Gracelyn Nurse, MD as PCP - General (Internal Medicine) Benita Gutter, RN as Oncology Nurse Navigator Orlie Dakin, Tollie Pizza, MD as Consulting Physician (Oncology)  CHIEF COMPLAINT: Pathologic stage IIa adenocarcinoma the pancreas.  INTERVAL HISTORY: Patient returns to clinic today for further evaluation and consideration of cycle 2, day 1 of gemcitabine and Xeloda.  He got off schedule with Xeloda and is only starting his week off currently.  He currently feels well.  He is tolerating his treatments without significant side effects.  He has no neurologic complaints.  He denies any recent fevers or illnesses.  He has a good appetite and denies weight loss.  He has no chest pain, shortness of breath, cough, or hemoptysis.  He has no abdominal pain.  He denies any nausea, vomiting, constipation, or diarrhea.  He has no urinary complaints.  Patient offers no specific complaints today.  REVIEW OF SYSTEMS:   Review of Systems  Constitutional: Negative.  Negative for fever, malaise/fatigue and weight loss.  Respiratory: Negative.  Negative for cough, hemoptysis and shortness of breath.   Cardiovascular: Negative.  Negative for chest pain and leg swelling.  Gastrointestinal: Negative.  Negative for abdominal pain.  Genitourinary: Negative.  Negative for dysuria.  Musculoskeletal: Negative.  Negative for back pain.  Skin: Negative.  Negative for rash.  Neurological: Negative.  Negative for dizziness, focal weakness, weakness and headaches.  Psychiatric/Behavioral: Negative.  The patient is not nervous/anxious.     As per HPI. Otherwise, a complete review of systems is negative.  PAST MEDICAL HISTORY: Past Medical History:  Diagnosis Date   Arthritis    GERD (gastroesophageal reflux disease)    Hiatal hernia    History of  kidney stones    Hypertension    Pancreatic cancer (HCC) 2024    PAST SURGICAL HISTORY: Past Surgical History:  Procedure Laterality Date   ANTERIOR CERVICAL DECOMP/DISCECTOMY FUSION     BACK SURGERY     x5   ESOPHAGOGASTRODUODENOSCOPY N/A 07/12/2022   Procedure: ESOPHAGOGASTRODUODENOSCOPY (EGD);  Surgeon: Lemar Lofty., MD;  Location: [facility];  Service: Gastroenterology;  Laterality: N/A;   ESOPHAGOGASTRODUODENOSCOPY (EGD) WITH PROPOFOL N/A 07/05/2022   Procedure: ESOPHAGOGASTRODUODENOSCOPY (EGD) WITH PROPOFOL;  Surgeon: [name], DO;  Location: [facility] ENDOSCOPY;  Service: Gastroenterology;  Laterality: N/A;   EUS N/A 07/12/2022   Procedure: UPPER ENDOSCOPIC ULTRASOUND (EUS) RADIAL;  Surgeon: Lemar Lofty., MD;  Location: [facility] ENDOSCOPY;  Service: Gastroenterology;  Laterality: N/A;   FINE NEEDLE ASPIRATION  07/12/2022   Procedure: FINE NEEDLE ASPIRATION;  Surgeon: Meridee Score, Netty Starring., MD;  Location: [facility];  Service: Gastroenterology;;   IR IMAGING GUIDED PORT INSERTION  09/29/2022   LAPAROSCOPY N/A 08/18/2022   Procedure: STAGING LAPAROSCOPY;  Surgeon: Fritzi Mandes, MD;  Location: [facility] OR;  Service: General;  Laterality: N/A;   OPERATIVE ULTRASOUND N/A 08/18/2022   Procedure: INTRAOPERATIVE ULTRASOUND;  Surgeon: Fritzi Mandes, MD;  Location: [facility] OR;  Service: General;  Laterality: N/A;   SHOULDER ARTHROSCOPY Bilateral    possibly metal in right shoulder, pt unsure   SPLENECTOMY, TOTAL N/A 08/18/2022   Procedure: SPLENECTOMY;  Surgeon: Fritzi Mandes, MD;  Location: [facility] OR;  Service: General;  Laterality: N/A;    FAMILY HISTORY: Family History  Problem Relation Age of Onset   Liver cancer Sister    Spina bifida Paternal Grandfather  ADVANCED DIRECTIVES (Y/N):  N  HEALTH MAINTENANCE: Social History   Tobacco Use   Smoking status: Every Day    Current packs/day: 0.50    Average packs/day: 0.5 packs/day for 40.0 years (20.0 ttl  pk-yrs)    Types: Cigarettes   Smokeless tobacco: Never  Vaping Use   Vaping status: Never Used  Substance Use Topics   Alcohol use: Not Currently   Drug use: Yes    Frequency: 1.0 times per week    Types: Marijuana     Colonoscopy:  PAP:  Bone density:  Lipid panel:  Allergies  Allergen Reactions   Aspirin     Acid reflux     Pregabalin     Dizziness   Tape     Blisters skin, paper tape is ok   Codeine Palpitations    headaches   Oxymorphone Rash   Penicillins Hives and Rash   Vicodin [Hydrocodone-Acetaminophen] Palpitations    Headaches     Current Outpatient Medications  Medication Sig Dispense Refill   ALPRAZolam (XANAX) 1 MG tablet Take 1 mg by mouth 2 (two) times daily.     budesonide-formoterol (SYMBICORT) 160-4.5 MCG/ACT inhaler Inhale 2 puffs into the lungs 2 (two) times daily as needed (shortness of breath).     capecitabine (XELODA) 500 MG tablet Take 3 tablets (1,500 mg total) by mouth 2 (two) times daily after a meal. Take for 21 days, then hold for 7 days. Repeat every 28 days. 126 tablet 1   lidocaine-prilocaine (EMLA) cream Apply to affected area once 30 g 3   lisinopril (ZESTRIL) 20 MG tablet Take 20 mg by mouth daily.     meloxicam (MOBIC) 15 MG tablet Take 15 mg by mouth daily.     methocarbamol (ROBAXIN) 500 MG tablet Take 2 tablets (1,000 mg total) by mouth every 6 (six) hours as needed. 60 tablet 0   morphine (MS CONTIN) 30 MG 12 hr tablet Take 1 tablet (30 mg total) by mouth every 8 (eight) hours. 90 tablet 0   omeprazole (PRILOSEC OTC) 20 MG tablet Take 1 tablet (20 mg total) by mouth daily. 90 tablet 0   ondansetron (ZOFRAN) 8 MG tablet Take 1 tablet (8 mg total) by mouth every 8 (eight) hours as needed for nausea or vomiting. 60 tablet 1   Oxycodone HCl 10 MG TABS Take 1 tablet (10 mg total) by mouth every 4 (four) hours as needed (breakthrough pain). 20 tablet 0   pantoprazole (PROTONIX) 40 MG tablet Take 1 tablet (40 mg total) by mouth daily.  60 tablet 0   polyethylene glycol (MIRALAX / GLYCOLAX) 17 g packet Take 17 g by mouth daily. 14 each 0   prochlorperazine (COMPAZINE) 10 MG tablet Take 1 tablet (10 mg total) by mouth every 6 (six) hours as needed for nausea or vomiting. 60 tablet 2   rosuvastatin (CRESTOR) 20 MG tablet Take 20 mg by mouth daily.     senna (SENOKOT) 8.6 MG TABS tablet Take 2 tablets (17.2 mg total) by mouth at bedtime. 120 tablet 0   acetaminophen (TYLENOL) 500 MG tablet Take 2 tablets (1,000 mg total) by mouth 3 (three) times daily. (Patient not taking: Reported on 10/15/2022) 30 tablet 0   No current facility-administered medications for this visit.   Facility-Administered Medications Ordered in Other Visits  Medication Dose Route Frequency Provider Last Rate Last Admin   gemcitabine (GEMZAR) 1,786 mg in sodium chloride 0.9 % 250 mL chemo infusion  1,000 mg/m2 (Treatment Plan  Recorded) Intravenous Once Jeralyn Ruths, MD       heparin lock flush 100 unit/mL  500 Units Intracatheter Once PRN Jeralyn Ruths, MD        OBJECTIVE: Vitals:   10/29/22 0910  BP: 120/78  Pulse: 69  Resp: 17  Temp: 98.4 F (36.9 C)  SpO2: 96%      Body mass index is 21.29 kg/m.    ECOG FS:0 - Asymptomatic  General: Well-developed, well-nourished, no acute distress. Eyes: Pink conjunctiva, anicteric sclera. HEENT: Normocephalic, moist mucous membranes. Lungs: No audible wheezing or coughing. Heart: Regular rate and rhythm. Abdomen: Soft, nontender, no obvious distention. Musculoskeletal: No edema, cyanosis, or clubbing. Neuro: Alert, answering all questions appropriately. Cranial nerves grossly intact. Skin: No rashes or petechiae noted. Psych: Normal affect.  LAB RESULTS:  Lab Results  Component Value Date   NA 138 10/29/2022   K 3.8 10/29/2022   CL 105 10/29/2022   CO2 25 10/29/2022   GLUCOSE 154 (H) 10/29/2022   BUN 14 10/29/2022   CREATININE 0.67 10/29/2022   CALCIUM 8.9 10/29/2022   PROT 7.3  10/29/2022   ALBUMIN 4.2 10/29/2022   AST 21 10/29/2022   ALT 21 10/29/2022   ALKPHOS 68 10/29/2022   BILITOT 0.3 10/29/2022   GFRNONAA >60 10/29/2022   GFRAA >60 04/13/2018    Lab Results  Component Value Date   WBC 8.8 10/29/2022   NEUTROABS 3.8 10/29/2022   HGB 12.7 (L) 10/29/2022   HCT 38.2 (L) 10/29/2022   MCV 89.9 10/29/2022   PLT 446 (H) 10/29/2022     STUDIES: No results found.  ASSESSMENT: Pathologic stage IIa adenocarcinoma the pancreas.  PLAN:    Pathologic stage IIa adenocarcinoma the pancreas: Patient underwent surgical resection on August 18, 2022 confirming stage of disease.  Previously, PET scan did not reveal any metastatic disease.  Preoperatively patient's CA 19-9 was 130, currently within normal limits at 34.  Recommendation was to pursue adjuvant chemotherapy using gemcitabine and capecitabine.  Patient will receive weekly gemcitabine on days 1, 8, and 15 with oral capecitabine days 1 through 21.  Patient will have a 7-day break.  Plan on 4-6 cycles.  Proceed with cycle 2, day 1 of gemcitabine today.  Return to clinic in 1 week for further evaluation and consideration of cycle 2, day 8. Appreciate clinical pharmacy input.   Pain: Patient does not complain of this today.  Patient has been on chronic narcotics for greater than 20 years.  Currently on MS Contin with as needed oxycodone.   Hypokalemia: Resolved.  Patient has discontinued oral potassium supplementation. Anemia: Hemoglobin improved to 12.7.     Thrombocytosis: Likely reactive, monitor.  Patient expressed understanding and was in agreement with this plan. He also understands that He can call clinic at any time with any questions, concerns, or complaints.    Cancer Staging  Pancreatic adenocarcinoma [facility]) Staging form: Exocrine Pancreas, AJCC 8th Edition - Clinical stage from 07/16/2022: Stage IIA (cT3, cN0, cM0) - Signed by Jeralyn Ruths, MD on 07/16/2022 Stage prefix: Initial diagnosis Total  positive nodes: 0   Jeralyn Ruths, MD   10/29/2022 10:20 AM

## 2022-10-29 NOTE — Progress Notes (Signed)
Enrolled David Harrell into the [employer]

## 2022-10-29 NOTE — Patient Instructions (Signed)
[facility]  Discharge Instructions: Thank you for choosing [facility] to provide your oncology and hematology care.  If you have a lab appointment with the Cancer Center, please go directly to the Cancer Center and check in at the registration area.  Wear comfortable clothing and clothing appropriate for easy access to any Portacath or PICC line.   We strive to give you quality time with your provider. You may need to reschedule your appointment if you arrive late (15 or more minutes).  Arriving late affects you and other patients whose appointments are after yours.  Also, if you miss three or more appointments without notifying the office, you may be dismissed from the clinic at the provider's discretion.      For prescription refill requests, have your pharmacy contact our office and allow 72 hours for refills to be completed.    Today you received the following chemotherapy and/or immunotherapy agents Gemzar       To help prevent nausea and vomiting after your treatment, we encourage you to take your nausea medication as directed.  BELOW ARE SYMPTOMS THAT SHOULD BE REPORTED IMMEDIATELY: *FEVER GREATER THAN 100.4 F (38 C) OR HIGHER *CHILLS OR SWEATING *NAUSEA AND VOMITING THAT IS NOT CONTROLLED WITH YOUR NAUSEA MEDICATION *UNUSUAL SHORTNESS OF BREATH *UNUSUAL BRUISING OR BLEEDING *URINARY PROBLEMS (pain or burning when urinating, or frequent urination) *BOWEL PROBLEMS (unusual diarrhea, constipation, pain near the anus) TENDERNESS IN MOUTH AND THROAT WITH OR WITHOUT PRESENCE OF ULCERS (sore throat, sores in mouth, or a toothache) UNUSUAL RASH, SWELLING OR PAIN  UNUSUAL VAGINAL DISCHARGE OR ITCHING   Items with * indicate a potential emergency and should be followed up as soon as possible or go to the Emergency Department if any problems should occur.  Please show the CHEMOTHERAPY ALERT CARD or IMMUNOTHERAPY ALERT CARD at check-in to  the Emergency Department and triage nurse.  Should you have questions after your visit or need to cancel or reschedule your appointment, please contact [facility]  [phone] and follow the prompts.  Office hours are 8:00 a.m. to 4:30 p.m. Monday - Friday. Please note that voicemails left after 4:00 p.m. may not be returned until the following business day.  We are closed weekends and major holidays. You have access to a nurse at all times for urgent questions. Please call the main number to the clinic [phone] and follow the prompts.  For any non-urgent questions, you may also contact your provider using MyChart. We now offer e-Visits for anyone 18 and older to request care online for non-urgent symptoms. For details visit [email or web address].   Also download the MyChart app! Go to the app store, search "MyChart", open the app, select [facility], and log in with your MyChart username and password.    

## 2022-11-01 ENCOUNTER — Encounter: Payer: Self-pay | Admitting: Oncology

## 2022-11-03 DIAGNOSIS — E7800 Pure hypercholesterolemia, unspecified: Secondary | ICD-10-CM | POA: Diagnosis not present

## 2022-11-03 DIAGNOSIS — R7303 Prediabetes: Secondary | ICD-10-CM | POA: Diagnosis not present

## 2022-11-03 DIAGNOSIS — Z0001 Encounter for general adult medical examination with abnormal findings: Secondary | ICD-10-CM | POA: Diagnosis not present

## 2022-11-03 DIAGNOSIS — F17210 Nicotine dependence, cigarettes, uncomplicated: Secondary | ICD-10-CM | POA: Diagnosis not present

## 2022-11-03 DIAGNOSIS — I10 Essential (primary) hypertension: Secondary | ICD-10-CM | POA: Diagnosis not present

## 2022-11-05 ENCOUNTER — Inpatient Hospital Stay: Payer: Medicare HMO

## 2022-11-05 ENCOUNTER — Encounter: Payer: Self-pay | Admitting: Oncology

## 2022-11-05 ENCOUNTER — Inpatient Hospital Stay (HOSPITAL_BASED_OUTPATIENT_CLINIC_OR_DEPARTMENT_OTHER): Payer: Medicare HMO | Admitting: Oncology

## 2022-11-05 VITALS — BP 132/79 | HR 54

## 2022-11-05 DIAGNOSIS — Z5111 Encounter for antineoplastic chemotherapy: Secondary | ICD-10-CM | POA: Diagnosis not present

## 2022-11-05 DIAGNOSIS — C259 Malignant neoplasm of pancreas, unspecified: Secondary | ICD-10-CM

## 2022-11-05 DIAGNOSIS — C252 Malignant neoplasm of tail of pancreas: Secondary | ICD-10-CM | POA: Diagnosis not present

## 2022-11-05 DIAGNOSIS — Z79899 Other long term (current) drug therapy: Secondary | ICD-10-CM | POA: Diagnosis not present

## 2022-11-05 LAB — CBC WITH DIFFERENTIAL (CANCER CENTER ONLY)
Abs Immature Granulocytes: 0.02 10*3/uL (ref 0.00–0.07)
Basophils Absolute: 0.1 10*3/uL (ref 0.0–0.1)
Basophils Relative: 1 %
Eosinophils Absolute: 0.1 10*3/uL (ref 0.0–0.5)
Eosinophils Relative: 2 %
HCT: 36.7 % — ABNORMAL LOW (ref 39.0–52.0)
Hemoglobin: 12.2 g/dL — ABNORMAL LOW (ref 13.0–17.0)
Immature Granulocytes: 0 %
Lymphocytes Relative: 50 %
Lymphs Abs: 3.7 10*3/uL (ref 0.7–4.0)
MCH: 30 pg (ref 26.0–34.0)
MCHC: 33.2 g/dL (ref 30.0–36.0)
MCV: 90.4 fL (ref 80.0–100.0)
Monocytes Absolute: 0.7 10*3/uL (ref 0.1–1.0)
Monocytes Relative: 10 %
Neutro Abs: 2.7 10*3/uL (ref 1.7–7.7)
Neutrophils Relative %: 37 %
Platelet Count: 471 10*3/uL — ABNORMAL HIGH (ref 150–400)
RBC: 4.06 MIL/uL — ABNORMAL LOW (ref 4.22–5.81)
RDW: 18.5 % — ABNORMAL HIGH (ref 11.5–15.5)
WBC Count: 7.4 10*3/uL (ref 4.0–10.5)
nRBC: 0.8 % — ABNORMAL HIGH (ref 0.0–0.2)

## 2022-11-05 LAB — CMP (CANCER CENTER ONLY)
ALT: 22 U/L (ref 0–44)
AST: 23 U/L (ref 15–41)
Albumin: 4.3 g/dL (ref 3.5–5.0)
Alkaline Phosphatase: 66 U/L (ref 38–126)
Anion gap: 8 (ref 5–15)
BUN: 7 mg/dL (ref 6–20)
CO2: 24 mmol/L (ref 22–32)
Calcium: 8.8 mg/dL — ABNORMAL LOW (ref 8.9–10.3)
Chloride: 105 mmol/L (ref 98–111)
Creatinine: 0.67 mg/dL (ref 0.61–1.24)
GFR, Estimated: >60 mL/min (ref >60)
Glucose, Bld: 146 mg/dL — ABNORMAL HIGH (ref 70–99)
Potassium: 3.5 mmol/L (ref 3.5–5.1)
Sodium: 137 mmol/L (ref 135–145)
Total Bilirubin: 0.2 mg/dL — ABNORMAL LOW (ref 0.3–1.2)
Total Protein: 7.5 g/dL (ref 6.5–8.1)

## 2022-11-05 MED ORDER — SODIUM CHLORIDE 0.9 % IV SOLN
1000.0000 mg/m2 | Freq: Once | INTRAVENOUS | Status: AC
  Administered 2022-11-05: 1786 mg via INTRAVENOUS
  Filled 2022-11-05: qty 46.97

## 2022-11-05 MED ORDER — SODIUM CHLORIDE 0.9 % IV SOLN
Freq: Once | INTRAVENOUS | Status: AC
  Filled 2022-11-05: qty 250

## 2022-11-05 MED ORDER — HEPARIN SOD (PORK) LOCK FLUSH 100 UNIT/ML IV SOLN
500.0000 [IU] | Freq: Once | INTRAVENOUS | Status: AC | PRN
  Administered 2022-11-05: 500 [IU]
  Filled 2022-11-05: qty 5

## 2022-11-05 MED ORDER — PROCHLORPERAZINE MALEATE 10 MG PO TABS
10.0000 mg | ORAL_TABLET | Freq: Once | ORAL | Status: AC
  Administered 2022-11-05: 10 mg via ORAL
  Filled 2022-11-05: qty 1

## 2022-11-05 NOTE — Patient Instructions (Signed)
[facility]  Discharge Instructions: Thank you for choosing [facility] to provide your oncology and hematology care.  If you have a lab appointment with the [facility], please go directly to the [facility] and check in at the registration area.  Wear comfortable clothing and clothing appropriate for easy access to any Portacath or PICC line.   We strive to give you quality time with your provider. You may need to reschedule your appointment if you arrive late (15 or more minutes).  Arriving late affects you and other patients whose appointments are after yours.  Also, if you miss three or more appointments without notifying the office, you may be dismissed from the clinic at the provider's discretion.      For prescription refill requests, have your pharmacy contact our office and allow 72 hours for refills to be completed.    Today you received the following chemotherapy and/or immunotherapy agents gemzar    To help prevent nausea and vomiting after your treatment, we encourage you to take your nausea medication as directed.  BELOW ARE SYMPTOMS THAT SHOULD BE REPORTED IMMEDIATELY: *FEVER GREATER THAN 100.4 F (38 C) OR HIGHER *CHILLS OR SWEATING *NAUSEA AND VOMITING THAT IS NOT CONTROLLED WITH YOUR NAUSEA MEDICATION *UNUSUAL SHORTNESS OF BREATH *UNUSUAL BRUISING OR BLEEDING *URINARY PROBLEMS (pain or burning when urinating, or frequent urination) *BOWEL PROBLEMS (unusual diarrhea, constipation, pain near the anus) TENDERNESS IN MOUTH AND THROAT WITH OR WITHOUT PRESENCE OF ULCERS (sore throat, sores in mouth, or a toothache) UNUSUAL RASH, SWELLING OR PAIN  UNUSUAL VAGINAL DISCHARGE OR ITCHING   Items with * indicate a potential emergency and should be followed up as soon as possible or go to the Emergency Department if any problems should occur.  Please show the CHEMOTHERAPY ALERT CARD or IMMUNOTHERAPY ALERT CARD at check-in to the  Emergency Department and triage nurse.  Should you have questions after your visit or need to cancel or reschedule your appointment, please contact [facility]  [phone] and follow the prompts.  Office hours are 8:00 a.m. to 4:30 p.m. Monday - Friday. Please note that voicemails left after 4:00 p.m. may not be returned until the following business day.  We are closed weekends and major holidays. You have access to a nurse at all times for urgent questions. Please call the main number to the clinic [phone] and follow the prompts.  For any non-urgent questions, you may also contact your provider using MyChart. We now offer e-Visits for anyone 63 and older to request care online for non-urgent symptoms. For details visit [email or web address].   Also download the MyChart app! Go to the app store, search "MyChart", open the app, select [facility], and log in with your MyChart username and password.

## 2022-11-05 NOTE — Progress Notes (Signed)
[facility]  Telephone:(336) [phone] Fax:(336) [phone]  ID: David Harrell OB: 1960/09/04  MR#: [id number]  QMV#:[id number]  Patient Care Team: Gracelyn Nurse, MD as PCP - General (Internal Medicine) Benita Gutter, RN as Oncology Nurse Navigator Orlie Dakin, Tollie Pizza, MD as Consulting Physician (Oncology)  CHIEF COMPLAINT: Pathologic stage IIa adenocarcinoma the pancreas.  INTERVAL HISTORY: Patient returns to clinic today for further evaluation and consideration of cycle 2, day 8 of gemcitabine and Xeloda.  He continues to tolerate his treatments well without significant side effects.  He currently feels well and is asymptomatic.  He has no neurologic complaints.  He denies any recent fevers or illnesses.  He has a good appetite and denies weight loss.  He has no chest pain, shortness of breath, cough, or hemoptysis.  He has no abdominal pain.  He denies any nausea, vomiting, constipation, or diarrhea.  He has no urinary complaints.  Patient offers no specific complaints today.  REVIEW OF SYSTEMS:   Review of Systems  Constitutional: Negative.  Negative for fever, malaise/fatigue and weight loss.  Respiratory: Negative.  Negative for cough, hemoptysis and shortness of breath.   Cardiovascular: Negative.  Negative for chest pain and leg swelling.  Gastrointestinal: Negative.  Negative for abdominal pain.  Genitourinary: Negative.  Negative for dysuria.  Musculoskeletal: Negative.  Negative for back pain.  Skin: Negative.  Negative for rash.  Neurological: Negative.  Negative for dizziness, focal weakness, weakness and headaches.  Psychiatric/Behavioral: Negative.  The patient is not nervous/anxious.     As per HPI. Otherwise, a complete review of systems is negative.  PAST MEDICAL HISTORY: Past Medical History:  Diagnosis Date   Arthritis    GERD (gastroesophageal reflux disease)    Hiatal hernia    History of kidney stones    Hypertension    Pancreatic  cancer (HCC) 2024    PAST SURGICAL HISTORY: Past Surgical History:  Procedure Laterality Date   ANTERIOR CERVICAL DECOMP/DISCECTOMY FUSION     BACK SURGERY     x5   ESOPHAGOGASTRODUODENOSCOPY N/A 07/12/2022   Procedure: ESOPHAGOGASTRODUODENOSCOPY (EGD);  Surgeon: Lemar Lofty., MD;  Location: [facility];  Service: Gastroenterology;  Laterality: N/A;   ESOPHAGOGASTRODUODENOSCOPY (EGD) WITH PROPOFOL N/A 07/05/2022   Procedure: ESOPHAGOGASTRODUODENOSCOPY (EGD) WITH PROPOFOL;  Surgeon: [name], DO;  Location: [facility] ENDOSCOPY;  Service: Gastroenterology;  Laterality: N/A;   EUS N/A 07/12/2022   Procedure: UPPER ENDOSCOPIC ULTRASOUND (EUS) RADIAL;  Surgeon: Lemar Lofty., MD;  Location: [facility] ENDOSCOPY;  Service: Gastroenterology;  Laterality: N/A;   FINE NEEDLE ASPIRATION  07/12/2022   Procedure: FINE NEEDLE ASPIRATION;  Surgeon: Meridee Score, Netty Starring., MD;  Location: [facility];  Service: Gastroenterology;;   IR IMAGING GUIDED PORT INSERTION  09/29/2022   LAPAROSCOPY N/A 08/18/2022   Procedure: STAGING LAPAROSCOPY;  Surgeon: Fritzi Mandes, MD;  Location: [facility] OR;  Service: General;  Laterality: N/A;   OPERATIVE ULTRASOUND N/A 08/18/2022   Procedure: INTRAOPERATIVE ULTRASOUND;  Surgeon: Fritzi Mandes, MD;  Location: [facility] OR;  Service: General;  Laterality: N/A;   SHOULDER ARTHROSCOPY Bilateral    possibly metal in right shoulder, pt unsure   SPLENECTOMY, TOTAL N/A 08/18/2022   Procedure: SPLENECTOMY;  Surgeon: Fritzi Mandes, MD;  Location: [facility] OR;  Service: General;  Laterality: N/A;    FAMILY HISTORY: Family History  Problem Relation Age of Onset   Liver cancer Sister    Spina bifida Paternal Grandfather     ADVANCED DIRECTIVES (Y/N):  N  HEALTH MAINTENANCE: Social History   Tobacco Use   Smoking status: Every Day    Current packs/day: 0.50    Average packs/day: 0.5 packs/day for 40.0 years (20.0 ttl pk-yrs)    Types: Cigarettes   Smokeless  tobacco: Never  Vaping Use   Vaping status: Never Used  Substance Use Topics   Alcohol use: Not Currently   Drug use: Yes    Frequency: 1.0 times per week    Types: Marijuana     Colonoscopy:  PAP:  Bone density:  Lipid panel:  Allergies  Allergen Reactions   Aspirin     Acid reflux     Pregabalin     Dizziness   Tape     Blisters skin, paper tape is ok   Codeine Palpitations    headaches   Oxymorphone Rash   Penicillins Hives and Rash   Vicodin [Hydrocodone-Acetaminophen] Palpitations    Headaches     Current Outpatient Medications  Medication Sig Dispense Refill   ALPRAZolam (XANAX) 1 MG tablet Take 1 mg by mouth 2 (two) times daily.     budesonide-formoterol (SYMBICORT) 160-4.5 MCG/ACT inhaler Inhale 2 puffs into the lungs 2 (two) times daily as needed (shortness of breath).     capecitabine (XELODA) 500 MG tablet Take 3 tablets (1,500 mg total) by mouth 2 (two) times daily after a meal. Take for 21 days, then hold for 7 days. Repeat every 28 days. 126 tablet 1   KLOR-CON M20 20 MEQ tablet Take 20 mEq by mouth 2 (two) times daily.     lidocaine-prilocaine (EMLA) cream Apply to affected area once 30 g 3   lisinopril (ZESTRIL) 20 MG tablet Take 20 mg by mouth daily.     meloxicam (MOBIC) 15 MG tablet Take 15 mg by mouth daily.     methocarbamol (ROBAXIN) 500 MG tablet Take 2 tablets (1,000 mg total) by mouth every 6 (six) hours as needed. 60 tablet 0   morphine (MS CONTIN) 30 MG 12 hr tablet Take 1 tablet (30 mg total) by mouth every 8 (eight) hours. 90 tablet 0   omeprazole (PRILOSEC OTC) 20 MG tablet Take 1 tablet (20 mg total) by mouth daily. 90 tablet 0   ondansetron (ZOFRAN) 8 MG tablet Take 1 tablet (8 mg total) by mouth every 8 (eight) hours as needed for nausea or vomiting. 60 tablet 1   Oxycodone HCl 10 MG TABS Take 1 tablet (10 mg total) by mouth every 4 (four) hours as needed (breakthrough pain). 20 tablet 0   pantoprazole (PROTONIX) 40 MG tablet Take 1  tablet (40 mg total) by mouth daily. 60 tablet 0   polyethylene glycol (MIRALAX / GLYCOLAX) 17 g packet Take 17 g by mouth daily. 14 each 0   prochlorperazine (COMPAZINE) 10 MG tablet Take 1 tablet (10 mg total) by mouth every 6 (six) hours as needed for nausea or vomiting. 60 tablet 2   rosuvastatin (CRESTOR) 20 MG tablet Take 20 mg by mouth daily.     senna (SENOKOT) 8.6 MG TABS tablet Take 2 tablets (17.2 mg total) by mouth at bedtime. 120 tablet 0   acetaminophen (TYLENOL) 500 MG tablet Take 2 tablets (1,000 mg total) by mouth 3 (three) times daily. (Patient not taking: Reported on 10/15/2022) 30 tablet 0   No current facility-administered medications for this visit.    OBJECTIVE: Vitals:   11/05/22 0916  BP: 107/77  Pulse: 67  Resp: 18  Temp: 98.6 F (37 C)  SpO2:  98%       Body mass index is 21.23 kg/m.    ECOG FS:0 - Asymptomatic  General: Well-developed, well-nourished, no acute distress. Eyes: Pink conjunctiva, anicteric sclera. HEENT: Normocephalic, moist mucous membranes. Lungs: No audible wheezing or coughing. Heart: Regular rate and rhythm. Abdomen: Soft, nontender, no obvious distention. Musculoskeletal: No edema, cyanosis, or clubbing. Neuro: Alert, answering all questions appropriately. Cranial nerves grossly intact. Skin: No rashes or petechiae noted. Psych: Normal affect.  LAB RESULTS:  Lab Results  Component Value Date   NA 137 11/05/2022   K 3.5 11/05/2022   CL 105 11/05/2022   CO2 24 11/05/2022   GLUCOSE 146 (H) 11/05/2022   BUN 7 11/05/2022   CREATININE 0.67 11/05/2022   CALCIUM 8.8 (L) 11/05/2022   PROT 7.5 11/05/2022   ALBUMIN 4.3 11/05/2022   AST 23 11/05/2022   ALT 22 11/05/2022   ALKPHOS 66 11/05/2022   BILITOT 0.2 (L) 11/05/2022   GFRNONAA >60 11/05/2022   GFRAA >60 04/13/2018    Lab Results  Component Value Date   WBC 7.4 11/05/2022   NEUTROABS 2.7 11/05/2022   HGB 12.2 (L) 11/05/2022   HCT 36.7 (L) 11/05/2022   MCV 90.4  11/05/2022   PLT 471 (H) 11/05/2022     STUDIES: No results found.  ASSESSMENT: Pathologic stage IIa adenocarcinoma the pancreas.  PLAN:    Pathologic stage IIa adenocarcinoma the pancreas: Patient underwent surgical resection on August 18, 2022 confirming stage of disease.  Previously, PET scan did not reveal any metastatic disease.  Preoperatively patient's CA 19-9 was 130, currently within normal limits at 34.  Recommendation was to pursue adjuvant chemotherapy using gemcitabine and capecitabine.  Patient will receive weekly gemcitabine on days 1, 8, and 15 with oral capecitabine days 1 through 21.  Patient will have a 7-day break.  Plan on 4-6 cycles.  Proceed with cycle 2, day 8 of gemcitabine today.  Return to clinic in 1 week for further evaluation and consideration of cycle 2, day 15.    Pain: Patient does not complain of this today.  Patient has been on chronic narcotics for greater than 20 years.  Currently on MS Contin with as needed oxycodone.   Hypokalemia: Resolved.  Patient has discontinued oral potassium supplementation. Anemia: Hemoglobin trended down slightly to 12.2, monitor.   Thrombocytosis: Likely reactive, monitor.  I spent a total of 30 minutes reviewing chart data, face-to-face evaluation with the patient, counseling and coordination of care as detailed above.   Patient expressed understanding and was in agreement with this plan. He also understands that He can call clinic at any time with any questions, concerns, or complaints.    Cancer Staging  Pancreatic adenocarcinoma [facility]) Staging form: Exocrine Pancreas, AJCC 8th Edition - Clinical stage from 07/16/2022: Stage IIA (cT3, cN0, cM0) - Signed by Jeralyn Ruths, MD on 07/16/2022 Stage prefix: Initial diagnosis Total positive nodes: 0   Jeralyn Ruths, MD   11/05/2022 9:50 AM

## 2022-11-05 NOTE — Progress Notes (Signed)
Nutrition Follow-up:  Patient with pancreatic cancer.  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  Patient receiving gemcitabine and oral capecitabine.   Met with patient and wife during infusion.  Reports that his appetite is good.  This cycle felt more fatigued than past cycles.  Had a fall in his driveway, nursing aware.  Says that he has been eating cereal for breakfast.  Lunch yesterday was ham sandwich and last night for dinner ate chicken with greens and carrots.  Has not been drinking oral nutrition supplements due to cost.      Medications: reviewed  Labs: reviewed  Anthropometrics:   Weight 139 lb 9.6 oz today 145 lb 14.4 oz on 8/2 144 lb 12/8 oz on 7/11 143 lb on 5/30 prior to surgery   NUTRITION DIAGNOSIS: Inadequate oral intake continues    INTERVENTION:  Encouraged small frequent meals high in calories and protein Reviewed how to read food label with patient and look for calories Coupons given for oral nutrition supplements.      MONITORING, EVALUATION, GOAL: weight trends, intake   NEXT VISIT: Friday, Sept 27 during infusion  Lacreshia Bondarenko B. Freida Busman, RD, LDN Registered Dietitian [phone]

## 2022-11-12 ENCOUNTER — Inpatient Hospital Stay: Payer: Medicare HMO | Attending: Oncology

## 2022-11-12 ENCOUNTER — Inpatient Hospital Stay (HOSPITAL_BASED_OUTPATIENT_CLINIC_OR_DEPARTMENT_OTHER): Payer: Medicare HMO | Admitting: Oncology

## 2022-11-12 ENCOUNTER — Inpatient Hospital Stay: Payer: Medicare HMO

## 2022-11-12 VITALS — BP 156/72 | HR 88 | Temp 96.1°F | Resp 18

## 2022-11-12 DIAGNOSIS — C259 Malignant neoplasm of pancreas, unspecified: Secondary | ICD-10-CM

## 2022-11-12 DIAGNOSIS — Z5111 Encounter for antineoplastic chemotherapy: Secondary | ICD-10-CM | POA: Insufficient documentation

## 2022-11-12 DIAGNOSIS — R197 Diarrhea, unspecified: Secondary | ICD-10-CM | POA: Insufficient documentation

## 2022-11-12 DIAGNOSIS — C252 Malignant neoplasm of tail of pancreas: Secondary | ICD-10-CM | POA: Insufficient documentation

## 2022-11-12 DIAGNOSIS — Z79899 Other long term (current) drug therapy: Secondary | ICD-10-CM | POA: Insufficient documentation

## 2022-11-12 DIAGNOSIS — E876 Hypokalemia: Secondary | ICD-10-CM | POA: Insufficient documentation

## 2022-11-12 DIAGNOSIS — R112 Nausea with vomiting, unspecified: Secondary | ICD-10-CM | POA: Insufficient documentation

## 2022-11-12 LAB — CBC WITH DIFFERENTIAL (CANCER CENTER ONLY)
Abs Immature Granulocytes: 0.02 10*3/uL (ref 0.00–0.07)
Basophils Absolute: 0.1 10*3/uL (ref 0.0–0.1)
Basophils Relative: 2 %
Eosinophils Absolute: 0.1 10*3/uL (ref 0.0–0.5)
Eosinophils Relative: 1 %
HCT: 34 % — ABNORMAL LOW (ref 39.0–52.0)
Hemoglobin: 11.4 g/dL — ABNORMAL LOW (ref 13.0–17.0)
Immature Granulocytes: 0 %
Lymphocytes Relative: 44 %
Lymphs Abs: 3.3 10*3/uL (ref 0.7–4.0)
MCH: 30.6 pg (ref 26.0–34.0)
MCHC: 33.5 g/dL (ref 30.0–36.0)
MCV: 91.2 fL (ref 80.0–100.0)
Monocytes Absolute: 0.7 10*3/uL (ref 0.1–1.0)
Monocytes Relative: 9 %
Neutro Abs: 3.3 10*3/uL (ref 1.7–7.7)
Neutrophils Relative %: 44 %
Platelet Count: 228 10*3/uL (ref 150–400)
RBC: 3.73 MIL/uL — ABNORMAL LOW (ref 4.22–5.81)
RDW: 19.3 % — ABNORMAL HIGH (ref 11.5–15.5)
WBC Count: 7.5 10*3/uL (ref 4.0–10.5)
nRBC: 1.6 % — ABNORMAL HIGH (ref 0.0–0.2)

## 2022-11-12 LAB — CMP (CANCER CENTER ONLY)
ALT: 16 U/L (ref 0–44)
AST: 19 U/L (ref 15–41)
Albumin: 4.1 g/dL (ref 3.5–5.0)
Alkaline Phosphatase: 54 U/L (ref 38–126)
Anion gap: 7 (ref 5–15)
BUN: 10 mg/dL (ref 8–23)
CO2: 25 mmol/L (ref 22–32)
Calcium: 8.6 mg/dL — ABNORMAL LOW (ref 8.9–10.3)
Chloride: 108 mmol/L (ref 98–111)
Creatinine: 0.71 mg/dL (ref 0.61–1.24)
GFR, Estimated: >60 mL/min (ref >60)
Glucose, Bld: 146 mg/dL — ABNORMAL HIGH (ref 70–99)
Potassium: 3.6 mmol/L (ref 3.5–5.1)
Sodium: 140 mmol/L (ref 135–145)
Total Bilirubin: 0.3 mg/dL (ref 0.3–1.2)
Total Protein: 6.9 g/dL (ref 6.5–8.1)

## 2022-11-12 MED ORDER — SODIUM CHLORIDE 0.9 % IV SOLN
Freq: Once | INTRAVENOUS | Status: AC
  Filled 2022-11-12: qty 250

## 2022-11-12 MED ORDER — PROCHLORPERAZINE MALEATE 10 MG PO TABS
10.0000 mg | ORAL_TABLET | Freq: Once | ORAL | Status: AC
  Administered 2022-11-12: 10 mg via ORAL
  Filled 2022-11-12: qty 1

## 2022-11-12 MED ORDER — SODIUM CHLORIDE 0.9 % IV SOLN
1000.0000 mg/m2 | Freq: Once | INTRAVENOUS | Status: AC
  Administered 2022-11-12: 1786 mg via INTRAVENOUS
  Filled 2022-11-12: qty 46.97

## 2022-11-12 MED ORDER — HEPARIN SOD (PORK) LOCK FLUSH 100 UNIT/ML IV SOLN
500.0000 [IU] | Freq: Once | INTRAVENOUS | Status: AC | PRN
  Administered 2022-11-12: 500 [IU]
  Filled 2022-11-12: qty 5

## 2022-11-12 NOTE — Patient Instructions (Signed)
[facility]  Discharge Instructions: Thank you for choosing [facility] to provide your oncology and hematology care.  If you have a lab appointment with the [facility], please go directly to the [facility] and check in at the registration area.  Wear comfortable clothing and clothing appropriate for easy access to any Portacath or PICC line.   We strive to give you quality time with your provider. You may need to reschedule your appointment if you arrive late (15 or more minutes).  Arriving late affects you and other patients whose appointments are after yours.  Also, if you miss three or more appointments without notifying the office, you may be dismissed from the clinic at the provider's discretion.      For prescription refill requests, have your pharmacy contact our office and allow 72 hours for refills to be completed.    Today you received the following chemotherapy and/or immunotherapy agents gemzar    To help prevent nausea and vomiting after your treatment, we encourage you to take your nausea medication as directed.  BELOW ARE SYMPTOMS THAT SHOULD BE REPORTED IMMEDIATELY: *FEVER GREATER THAN 100.4 F (38 C) OR HIGHER *CHILLS OR SWEATING *NAUSEA AND VOMITING THAT IS NOT CONTROLLED WITH YOUR NAUSEA MEDICATION *UNUSUAL SHORTNESS OF BREATH *UNUSUAL BRUISING OR BLEEDING *URINARY PROBLEMS (pain or burning when urinating, or frequent urination) *BOWEL PROBLEMS (unusual diarrhea, constipation, pain near the anus) TENDERNESS IN MOUTH AND THROAT WITH OR WITHOUT PRESENCE OF ULCERS (sore throat, sores in mouth, or a toothache) UNUSUAL RASH, SWELLING OR PAIN  UNUSUAL VAGINAL DISCHARGE OR ITCHING   Items with * indicate a potential emergency and should be followed up as soon as possible or go to the Emergency Department if any problems should occur.  Please show the CHEMOTHERAPY ALERT CARD or IMMUNOTHERAPY ALERT CARD at check-in to the  Emergency Department and triage nurse.  Should you have questions after your visit or need to cancel or reschedule your appointment, please contact [facility]  [phone] and follow the prompts.  Office hours are 8:00 a.m. to 4:30 p.m. Monday - Friday. Please note that voicemails left after 4:00 p.m. may not be returned until the following business day.  We are closed weekends and major holidays. You have access to a nurse at all times for urgent questions. Please call the main number to the clinic [phone] and follow the prompts.  For any non-urgent questions, you may also contact your provider using MyChart. We now offer e-Visits for anyone 63 and older to request care online for non-urgent symptoms. For details visit [email or web address].   Also download the MyChart app! Go to the app store, search "MyChart", open the app, select [facility], and log in with your MyChart username and password.

## 2022-11-12 NOTE — Progress Notes (Signed)
[facility]  Telephone:([phone]) [phone] [phone]  ID: David Harrell OB: 1960-06-29  MR#: [id number]  AOZ#:[id number]  Patient Care Team: Gracelyn Nurse, MD as PCP - General (Internal Medicine) Benita Gutter, RN as Oncology Nurse Navigator Orlie Dakin, Tollie Pizza, MD as Consulting Physician (Oncology)  CHIEF COMPLAINT: Pathologic stage IIa adenocarcinoma the pancreas.  INTERVAL HISTORY: Patient returns to clinic today for further evaluation and consideration of cycle 2, day 15 of gemcitabine and Xeloda.  He felt more fatigued this past week, but otherwise is tolerating his treatments well without significant side effects.  He has no neurologic complaints.  He denies any recent fevers or illnesses.  He has a good appetite and denies weight loss.  He has no chest pain, shortness of breath, cough, or hemoptysis.  He has no abdominal pain.  He denies any nausea, vomiting, constipation, or diarrhea.  He has no urinary complaints.  Patient offers no further specific complaints today.  REVIEW OF SYSTEMS:   Review of Systems  Constitutional:  Positive for malaise/fatigue. Negative for fever and weight loss.  Respiratory: Negative.  Negative for cough, hemoptysis and shortness of breath.   Cardiovascular: Negative.  Negative for chest pain and leg swelling.  Gastrointestinal: Negative.  Negative for abdominal pain.  Genitourinary: Negative.  Negative for dysuria.  Musculoskeletal: Negative.  Negative for back pain.  Skin: Negative.  Negative for rash.  Neurological: Negative.  Negative for dizziness, focal weakness, weakness and headaches.  Psychiatric/Behavioral: Negative.  The patient is not nervous/anxious.     As per HPI. Otherwise, a complete review of systems is negative.  PAST MEDICAL HISTORY: Past Medical History:  Diagnosis Date   Arthritis    GERD (gastroesophageal reflux disease)    Hiatal hernia    History of kidney stones    Hypertension     Pancreatic cancer (HCC) 2024    PAST SURGICAL HISTORY: Past Surgical History:  Procedure Laterality Date   ANTERIOR CERVICAL DECOMP/DISCECTOMY FUSION     BACK SURGERY     x5   ESOPHAGOGASTRODUODENOSCOPY N/A 07/12/2022   Procedure: ESOPHAGOGASTRODUODENOSCOPY (EGD);  Surgeon: Lemar Lofty., MD;  Location: [facility];  Service: Gastroenterology;  Laterality: N/A;   ESOPHAGOGASTRODUODENOSCOPY (EGD) WITH PROPOFOL N/A 07/05/2022   Procedure: ESOPHAGOGASTRODUODENOSCOPY (EGD) WITH PROPOFOL;  Surgeon: [name], DO;  Location: [facility];  Service: Gastroenterology;  Laterality: N/A;   EUS N/A 07/12/2022   Procedure: UPPER ENDOSCOPIC ULTRASOUND (EUS) RADIAL;  Surgeon: Lemar Lofty., MD;  Location: [facility]COPY;  Service: Gastroenterology;  Laterality: N/A;   FINE NEEDLE ASPIRATION  07/12/2022   Procedure: FINE NEEDLE ASPIRATION;  Surgeon: Meridee Score, Netty Starring., MD;  Location: [facility];  Service: Gastroenterology;;   IR IMAGING GUIDED PORT INSERTION  09/29/2022   LAPAROSCOPY N/A 08/18/2022   Procedure: STAGING LAPAROSCOPY;  Surgeon: Fritzi Mandes, MD;  Location: [facility] OR;  Service: General;  Laterality: N/A;   OPERATIVE ULTRASOUND N/A 08/18/2022   Procedure: INTRAOPERATIVE ULTRASOUND;  Surgeon: Fritzi Mandes, MD;  Location: [facility] OR;  Service: General;  Laterality: N/A;   SHOULDER ARTHROSCOPY Bilateral    possibly metal in right shoulder, pt unsure   SPLENECTOMY, TOTAL N/A 08/18/2022   Procedure: SPLENECTOMY;  Surgeon: Fritzi Mandes, MD;  Location: [facility] OR;  Service: General;  Laterality: N/A;    FAMILY HISTORY: Family History  Problem Relation Age of Onset   Liver cancer Sister    Spina bifida Paternal Grandfather     ADVANCED DIRECTIVES (Y/N):  N  HEALTH MAINTENANCE: Social History   Tobacco Use   Smoking status: Every Day    Current packs/day: 0.50    Average packs/day: 0.5 packs/day for 40.0 years (20.0 ttl pk-yrs)    Types: Cigarettes    Smokeless tobacco: Never  Vaping Use   Vaping status: Never Used  Substance Use Topics   Alcohol use: Not Currently   Drug use: Yes    Frequency: 1.0 times per week    Types: Marijuana     Colonoscopy:  PAP:  Bone density:  Lipid panel:  Allergies  Allergen Reactions   Aspirin     Acid reflux     Pregabalin     Dizziness   Tape     Blisters skin, paper tape is ok   Codeine Palpitations    headaches   Oxymorphone Rash   Penicillins Hives and Rash   Vicodin [Hydrocodone-Acetaminophen] Palpitations    Headaches     Current Outpatient Medications  Medication Sig Dispense Refill   ALPRAZolam (XANAX) 1 MG tablet Take 1 mg by mouth 2 (two) times daily.     budesonide-formoterol (SYMBICORT) 160-4.5 MCG/ACT inhaler Inhale 2 puffs into the lungs 2 (two) times daily as needed (shortness of breath).     capecitabine (XELODA) 500 MG tablet Take 3 tablets (1,500 mg total) by mouth 2 (two) times daily after a meal. Take for 21 days, then hold for 7 days. Repeat every 28 days. 126 tablet 1   KLOR-CON M20 20 MEQ tablet Take 20 mEq by mouth 2 (two) times daily.     lidocaine-prilocaine (EMLA) cream Apply to affected area once 30 g 3   lisinopril (ZESTRIL) 20 MG tablet Take 20 mg by mouth daily.     meloxicam (MOBIC) 15 MG tablet Take 15 mg by mouth daily.     methocarbamol (ROBAXIN) 500 MG tablet Take 2 tablets (1,000 mg total) by mouth every 6 (six) hours as needed. 60 tablet 0   morphine (MS CONTIN) 30 MG 12 hr tablet Take 1 tablet (30 mg total) by mouth every 8 (eight) hours. 90 tablet 0   omeprazole (PRILOSEC OTC) 20 MG tablet Take 1 tablet (20 mg total) by mouth daily. 90 tablet 0   ondansetron (ZOFRAN) 8 MG tablet Take 1 tablet (8 mg total) by mouth every 8 (eight) hours as needed for nausea or vomiting. 60 tablet 1   Oxycodone HCl 10 MG TABS Take 1 tablet (10 mg total) by mouth every 4 (four) hours as needed (breakthrough pain). 20 tablet 0   pantoprazole (PROTONIX) 40 MG tablet  Take 1 tablet (40 mg total) by mouth daily. 60 tablet 0   polyethylene glycol (MIRALAX / GLYCOLAX) 17 g packet Take 17 g by mouth daily. 14 each 0   prochlorperazine (COMPAZINE) 10 MG tablet Take 1 tablet (10 mg total) by mouth every 6 (six) hours as needed for nausea or vomiting. 60 tablet 2   rosuvastatin (CRESTOR) 20 MG tablet Take 20 mg by mouth daily.     senna (SENOKOT) 8.6 MG TABS tablet Take 2 tablets (17.2 mg total) by mouth at bedtime. 120 tablet 0   acetaminophen (TYLENOL) 500 MG tablet Take 2 tablets (1,000 mg total) by mouth 3 (three) times daily. (Patient not taking: Reported on 10/15/2022) 30 tablet 0   No current facility-administered medications for this visit.    OBJECTIVE: Vitals:   11/12/22 0855  BP: 120/68  Pulse: 60  Temp: (!) 97.3 F (36.3 C)  SpO2: 100%  Body mass index is 21.44 kg/m.    ECOG FS:0 - Asymptomatic  General: Well-developed, well-nourished, no acute distress. Eyes: Pink conjunctiva, anicteric sclera. HEENT: Normocephalic, moist mucous membranes. Lungs: No audible wheezing or coughing. Heart: Regular rate and rhythm. Abdomen: Soft, nontender, no obvious distention. Musculoskeletal: No edema, cyanosis, or clubbing. Neuro: Alert, answering all questions appropriately. Cranial nerves grossly intact. Skin: No rashes or petechiae noted. Psych: Normal affect.  LAB RESULTS:  Lab Results  Component Value Date   NA 140 11/12/2022   K 3.6 11/12/2022   CL 108 11/12/2022   CO2 25 11/12/2022   GLUCOSE 146 (H) 11/12/2022   BUN 10 11/12/2022   CREATININE 0.71 11/12/2022   CALCIUM 8.6 (L) 11/12/2022   PROT 6.9 11/12/2022   ALBUMIN 4.1 11/12/2022   AST 19 11/12/2022   ALT 16 11/12/2022   ALKPHOS 54 11/12/2022   BILITOT 0.3 11/12/2022   GFRNONAA >60 11/12/2022   GFRAA >60 04/13/2018    Lab Results  Component Value Date   WBC 7.5 11/12/2022   NEUTROABS 3.3 11/12/2022   HGB 11.4 (L) 11/12/2022   HCT 34.0 (L) 11/12/2022   MCV 91.2  11/12/2022   PLT 228 11/12/2022     STUDIES: No results found.  ASSESSMENT: Pathologic stage IIa adenocarcinoma the pancreas.  PLAN:    Pathologic stage IIa adenocarcinoma the pancreas: Patient underwent surgical resection on August 18, 2022 confirming stage of disease.  Previously, PET scan did not reveal any metastatic disease.  Preoperatively patient's CA 19-9 was 130, currently within normal limits at 34.  Recommendation was to pursue adjuvant chemotherapy using gemcitabine and capecitabine.  Patient will receive weekly gemcitabine on days 1, 8, and 15 with oral capecitabine days 1 through 21.  Patient will have a 7-day break.  Plan on 4-6 cycles.  Proceed with cycle 2, day 15 of gemcitabine today.  Return to clinic in 2 weeks for further evaluation and consideration of cycle 3, day 1.   Pain: Patient does not complain of this today.  Patient has been on chronic narcotics for greater than 20 years.  Currently on MS Contin with as needed oxycodone.   Hypokalemia: Resolved.  Patient has discontinued oral potassium supplementation. Anemia: Hemoglobin slowly trending down and is now 11.4, monitor. Thrombocytosis: Resolved.  I spent a total of 30 minutes reviewing chart data, face-to-face evaluation with the patient, counseling and coordination of care as detailed above.    Patient expressed understanding and was in agreement with this plan. He also understands that He can call clinic at any time with any questions, concerns, or complaints.    Cancer Staging  Pancreatic adenocarcinoma [facility]) Staging form: Exocrine Pancreas, AJCC 8th Edition - Clinical stage from 07/16/2022: Stage IIA (cT3, cN0, cM0) - Signed by Jeralyn Ruths, MD on 07/16/2022 Stage prefix: Initial diagnosis Total positive nodes: 0   Jeralyn Ruths, MD   11/12/2022 12:09 PM

## 2022-11-17 ENCOUNTER — Other Ambulatory Visit: Payer: Self-pay | Admitting: *Deleted

## 2022-11-17 DIAGNOSIS — C259 Malignant neoplasm of pancreas, unspecified: Secondary | ICD-10-CM

## 2022-11-17 MED ORDER — MORPHINE SULFATE ER 30 MG PO TBCR: 30.0000 mg | EXTENDED_RELEASE_TABLET | Freq: Three times a day (TID) | ORAL | 0 refills | Status: DC

## 2022-11-17 NOTE — Telephone Encounter (Signed)
I called patient and discussed that per physician once his chemotherapy is completed that he will need to get his other doctor to take over refilling his narcotics as before he started coming here. Patient acknowledged understanding and said tat he had intended to but Dr Orlie Dakin had made a change in his medicine so that is why had asked him for refill Please send this refill

## 2022-11-19 ENCOUNTER — Other Ambulatory Visit: Payer: Self-pay

## 2022-11-19 ENCOUNTER — Other Ambulatory Visit: Payer: Self-pay | Admitting: Oncology

## 2022-11-19 ENCOUNTER — Other Ambulatory Visit (HOSPITAL_COMMUNITY): Payer: Self-pay

## 2022-11-19 DIAGNOSIS — C259 Malignant neoplasm of pancreas, unspecified: Secondary | ICD-10-CM

## 2022-11-19 MED ORDER — CAPECITABINE 500 MG PO TABS
1500.0000 mg | ORAL_TABLET | Freq: Two times a day (BID) | ORAL | 1 refills | Status: AC
  Filled 2022-11-19: qty 126, 28d supply, fill #0
  Filled 2022-12-15: qty 126, 28d supply, fill #1

## 2022-11-26 ENCOUNTER — Encounter: Payer: Self-pay | Admitting: Oncology

## 2022-11-26 ENCOUNTER — Other Ambulatory Visit: Payer: Self-pay

## 2022-11-26 ENCOUNTER — Inpatient Hospital Stay: Payer: Medicare HMO

## 2022-11-26 ENCOUNTER — Inpatient Hospital Stay (HOSPITAL_BASED_OUTPATIENT_CLINIC_OR_DEPARTMENT_OTHER): Payer: Medicare HMO | Admitting: Nurse Practitioner

## 2022-11-26 ENCOUNTER — Inpatient Hospital Stay (HOSPITAL_BASED_OUTPATIENT_CLINIC_OR_DEPARTMENT_OTHER): Payer: Medicare HMO | Admitting: Oncology

## 2022-11-26 VITALS — BP 141/73 | HR 48 | Temp 98.4°F | Resp 16 | Ht 68.0 in | Wt 136.8 lb

## 2022-11-26 DIAGNOSIS — R197 Diarrhea, unspecified: Secondary | ICD-10-CM | POA: Diagnosis not present

## 2022-11-26 DIAGNOSIS — R001 Bradycardia, unspecified: Secondary | ICD-10-CM

## 2022-11-26 DIAGNOSIS — Z5111 Encounter for antineoplastic chemotherapy: Secondary | ICD-10-CM | POA: Diagnosis not present

## 2022-11-26 DIAGNOSIS — C259 Malignant neoplasm of pancreas, unspecified: Secondary | ICD-10-CM | POA: Diagnosis not present

## 2022-11-26 DIAGNOSIS — C252 Malignant neoplasm of tail of pancreas: Secondary | ICD-10-CM | POA: Diagnosis not present

## 2022-11-26 DIAGNOSIS — R112 Nausea with vomiting, unspecified: Secondary | ICD-10-CM | POA: Diagnosis not present

## 2022-11-26 DIAGNOSIS — Z79899 Other long term (current) drug therapy: Secondary | ICD-10-CM | POA: Diagnosis not present

## 2022-11-26 DIAGNOSIS — E876 Hypokalemia: Secondary | ICD-10-CM | POA: Diagnosis not present

## 2022-11-26 DIAGNOSIS — R000 Tachycardia, unspecified: Secondary | ICD-10-CM

## 2022-11-26 LAB — CBC WITH DIFFERENTIAL (CANCER CENTER ONLY)
Abs Immature Granulocytes: 0.05 10*3/uL (ref 0.00–0.07)
Basophils Absolute: 0.1 10*3/uL (ref 0.0–0.1)
Basophils Relative: 1 %
Eosinophils Absolute: 0.5 10*3/uL (ref 0.0–0.5)
Eosinophils Relative: 5 %
HCT: 36.5 % — ABNORMAL LOW (ref 39.0–52.0)
Hemoglobin: 12.1 g/dL — ABNORMAL LOW (ref 13.0–17.0)
Immature Granulocytes: 1 %
Lymphocytes Relative: 26 %
Lymphs Abs: 2.7 10*3/uL (ref 0.7–4.0)
MCH: 30.9 pg (ref 26.0–34.0)
MCHC: 33.2 g/dL (ref 30.0–36.0)
MCV: 93.4 fL (ref 80.0–100.0)
Monocytes Absolute: 1.5 10*3/uL — ABNORMAL HIGH (ref 0.1–1.0)
Monocytes Relative: 14 %
Neutro Abs: 5.8 10*3/uL (ref 1.7–7.7)
Neutrophils Relative %: 53 %
Platelet Count: 488 10*3/uL — ABNORMAL HIGH (ref 150–400)
RBC: 3.91 MIL/uL — ABNORMAL LOW (ref 4.22–5.81)
RDW: 22 % — ABNORMAL HIGH (ref 11.5–15.5)
WBC Count: 10.6 10*3/uL — ABNORMAL HIGH (ref 4.0–10.5)
nRBC: 0.4 % — ABNORMAL HIGH (ref 0.0–0.2)

## 2022-11-26 LAB — MAGNESIUM: Magnesium: 2.2 mg/dL (ref 1.7–2.4)

## 2022-11-26 LAB — CMP (CANCER CENTER ONLY)
ALT: 16 U/L (ref 0–44)
AST: 20 U/L (ref 15–41)
Albumin: 4.2 g/dL (ref 3.5–5.0)
Alkaline Phosphatase: 56 U/L (ref 38–126)
Anion gap: 8 (ref 5–15)
BUN: 11 mg/dL (ref 8–23)
CO2: 26 mmol/L (ref 22–32)
Calcium: 8.6 mg/dL — ABNORMAL LOW (ref 8.9–10.3)
Chloride: 102 mmol/L (ref 98–111)
Creatinine: 0.8 mg/dL (ref 0.61–1.24)
GFR, Estimated: >60 mL/min (ref >60)
Glucose, Bld: 170 mg/dL — ABNORMAL HIGH (ref 70–99)
Potassium: 3.3 mmol/L — ABNORMAL LOW (ref 3.5–5.1)
Sodium: 136 mmol/L (ref 135–145)
Total Bilirubin: 0.4 mg/dL (ref 0.3–1.2)
Total Protein: 7.2 g/dL (ref 6.5–8.1)

## 2022-11-26 MED ORDER — SODIUM CHLORIDE 0.9 % IV SOLN
Freq: Once | INTRAVENOUS | Status: AC
  Filled 2022-11-26: qty 250

## 2022-11-26 MED ORDER — POTASSIUM CHLORIDE 20 MEQ/100ML IV SOLN
20.0000 meq | Freq: Once | INTRAVENOUS | Status: AC
  Administered 2022-11-26: 20 meq via INTRAVENOUS

## 2022-11-26 MED ORDER — ONDANSETRON HCL 4 MG/2ML IJ SOLN
8.0000 mg | Freq: Once | INTRAMUSCULAR | Status: AC
  Administered 2022-11-26: 8 mg via INTRAVENOUS
  Filled 2022-11-26: qty 4

## 2022-11-26 NOTE — Progress Notes (Signed)
Since he stopped Xeloda for his 7 days off he has been sick. States he has been throwing up, nausea, diarrhea, no appetite, and headache. States port is "burning all the way to my neck"

## 2022-11-26 NOTE — Progress Notes (Signed)
Approximately 10am, pt reported numbness in right arm. Pt then appeared pale and began to "zone out." Blank stare noted. Potassium paused. Episode lasting aproximately 30 seconds before patient began to become more oriented and alert, now endorsing chest pain. By the time Lauren, NP was called and arrived at chairside at 10:04, pt back at baseline and making jokes. Per NP, EKG was obtained. Pt endorses complete resolution of symptoms.  1120: pt tolerated remainder of tx without incident. Per Leotis Shames, NP, ok to discharge home. Pt educated on need to seek emergency care if repeat incident occurs. Pt verbalized understanding.

## 2022-11-26 NOTE — Progress Notes (Signed)
Symptom Management Clinic  [facility] at [facility] A Department of the [place]. [facility] [address] [place], [place] [address] [phone] (phone) [phone] (fax)  Patient Care Team: Gracelyn Nurse, MD as PCP - General (Internal Medicine) Benita Gutter, RN as Oncology Nurse Navigator Orlie Dakin, Tollie Pizza, MD as Consulting Physician (Oncology)   Name of the [name]  [id number]  Aug 22, 1960   Date of visit: 11/26/22  Diagnosis- Pancreatic Cancer  Chief complaint/ Reason for visit- Numb arm and alteration of awareness  Heme/Onc history:  Oncology History  Pancreatic adenocarcinoma (HCC)  07/16/2022 Initial Diagnosis   Pancreatic adenocarcinoma (HCC)   07/16/2022 Cancer Staging   Staging form: Exocrine Pancreas, AJCC 8th Edition - Clinical stage from 07/16/2022: Stage IIA (cT3, cN0, cM0) - Signed by Jeralyn Ruths, MD on 07/16/2022 Stage prefix: Initial diagnosis Total positive nodes: 0   10/01/2022 -  Chemotherapy   Patient is on Treatment Plan : PANCREAS Gemcitabine D1,8,15 (1000) + Capecitabine D1-21 q28d x 6 cycles     10/10/2022 Genetic Testing   Negative genetic testing. No pathogenic variants identified on the [employer] Common Hereditary Cancers+RNA panel. The report date is 10/10/2022.  The Common Hereditary Cancers Panel + RNA offered by [employer] includes sequencing and/or deletion duplication testing of the following 48 genes: APC*, ATM*, AXIN2, BAP1, BARD1, BMPR1A, BRCA1, BRCA2, BRIP1, CDH1, CDK4, CDKN2A (p14ARF), CDKN2A (p16INK4a), CHEK2, CTNNA1, DICER1*, EPCAM*, FH*, GREM1*, HOXB13, KIT, MBD4, MEN1*, MLH1*, MSH2*, MSH3*, MSH6*, MUTYH, NF1*, NTHL1, PALB2, PDGFRA, PMS2*, POLD1*, POLE, PTEN*, RAD51C, RAD51D, SDHA*, SDHB, SDHC*, SDHD, SMAD4, SMARCA4, STK11, TP53, TSC1*, TSC2, VHL.      Interval history- Called to infusion at request of nursing fo rpossible infusion reaction. Patient was receiving  IV fluids and IV zofran when he complained of numbness of left arm. Per nursing and wife, he then had distant, altered look that lasted for a few moments then resolved spontaneously. He then complained of some chest pressure which also resolved. He has now returned to his baseline. He has been battling gastroenteritis this week and his planned chemotherapy was held today. He says he's never had anything like this happen before and it feels like he 'zoned out'. No headache, vision changes, weakness. He xeloda has been on hold this week as part of his usual 7 day off week.   Review of systems- Review of Systems  Constitutional:  Positive for malaise/fatigue. Negative for chills, fever and weight loss.  HENT:  Negative for hearing loss, nosebleeds, sore throat and tinnitus.   Eyes:  Negative for blurred vision and double vision.  Respiratory:  Negative for cough, hemoptysis, shortness of breath and wheezing.   Cardiovascular:  Negative for chest pain, palpitations and leg swelling.  Gastrointestinal:  Positive for nausea. Negative for abdominal pain, blood in stool, constipation, diarrhea, melena and vomiting.  Genitourinary:  Negative for dysuria and urgency.  Musculoskeletal:  Negative for back pain, falls, joint pain and myalgias.  Skin:  Negative for itching and rash.  Neurological:  Negative for dizziness, tingling, sensory change, seizures, loss of consciousness, weakness and headaches.  Endo/Heme/Allergies:  Negative for environmental allergies. Does not bruise/bleed easily.  Psychiatric/Behavioral:  Negative for depression. The patient is not nervous/anxious and does not have insomnia.     Current treatment- gemcitabine-xeloda  Allergies  Allergen Reactions   Aspirin     Acid reflux     Pregabalin     Dizziness   Tape  Blisters skin, paper tape is ok   Codeine Palpitations    headaches   Oxymorphone Rash   Penicillins Hives and Rash   Vicodin [Hydrocodone-Acetaminophen]  Palpitations    Headaches     Past Medical History:  Diagnosis Date   Arthritis    GERD (gastroesophageal reflux disease)    Hiatal hernia    History of kidney stones    Hypertension    Pancreatic cancer (HCC) 2024    Past Surgical History:  Procedure Laterality Date   ANTERIOR CERVICAL DECOMP/DISCECTOMY FUSION     BACK SURGERY     x5   ESOPHAGOGASTRODUODENOSCOPY N/A 07/12/2022   Procedure: ESOPHAGOGASTRODUODENOSCOPY (EGD);  Surgeon: Lemar Lofty., MD;  Location: [facility];  Service: Gastroenterology;  Laterality: N/A;   ESOPHAGOGASTRODUODENOSCOPY (EGD) WITH PROPOFOL N/A 07/05/2022   Procedure: ESOPHAGOGASTRODUODENOSCOPY (EGD) WITH PROPOFOL;  Surgeon: [name], DO;  Location: [facility] ENDOSCOPY;  Service: Gastroenterology;  Laterality: N/A;   EUS N/A 07/12/2022   Procedure: UPPER ENDOSCOPIC ULTRASOUND (EUS) RADIAL;  Surgeon: Lemar Lofty., MD;  Location: [facility];  Service: Gastroenterology;  Laterality: N/A;   FINE NEEDLE ASPIRATION  07/12/2022   Procedure: FINE NEEDLE ASPIRATION;  Surgeon: Meridee Score, Netty Starring., MD;  Location: [facility];  Service: Gastroenterology;;   IR IMAGING GUIDED PORT INSERTION  09/29/2022   LAPAROSCOPY N/A 08/18/2022   Procedure: STAGING LAPAROSCOPY;  Surgeon: Fritzi Mandes, MD;  Location: [facility] OR;  Service: General;  Laterality: N/A;   OPERATIVE ULTRASOUND N/A 08/18/2022   Procedure: INTRAOPERATIVE ULTRASOUND;  Surgeon: Fritzi Mandes, MD;  Location: [facility] OR;  Service: General;  Laterality: N/A;   SHOULDER ARTHROSCOPY Bilateral    possibly metal in right shoulder, pt unsure   SPLENECTOMY, TOTAL N/A 08/18/2022   Procedure: SPLENECTOMY;  Surgeon: Fritzi Mandes, MD;  Location: [facility] OR;  Service: General;  Laterality: N/A;    Social History   Socioeconomic History   Marital status: Married    Spouse name: Not on file   Number of children: 2   Years of education: Not on file   Highest education level: Not on file   Occupational History   Not on file  Tobacco Use   Smoking status: Every Day    Current packs/day: 0.50    Average packs/day: 0.5 packs/day for 40.0 years (20.0 ttl pk-yrs)    Types: Cigarettes   Smokeless tobacco: Never  Vaping Use   Vaping status: Never Used  Substance and Sexual Activity   Alcohol use: Not Currently   Drug use: Yes    Frequency: 1.0 times per week    Types: Marijuana   Sexual activity: Not on file  Other Topics Concern   Not on file  Social History Narrative   Not on file   Social Determinants of Health   Financial Resource Strain: Medium Risk (11/03/2022)   Received from [facility]   Overall Financial Resource Strain (CARDIA)    Difficulty of Paying Living Expenses: Somewhat hard  Food Insecurity: Food Insecurity Present (11/03/2022)   Received from [facility]   Hunger Vital Sign    Worried About Running Out of Food in the Last Year: Sometimes true    Ran Out of Food in the Last Year: Sometimes true  Transportation Needs: No Transportation Needs (11/03/2022)   Received from [facility]   PRAPARE - Transportation    In the past 12 months, has lack of transportation kept you from medical appointments or from getting  medications?: No    Lack of Transportation (Non-Medical): No  Physical Activity: Not on file  Stress: Not on file  Social Connections: Not on file  Intimate Partner Violence: Not At Risk (08/19/2022)   Humiliation, Afraid, Rape, and Kick questionnaire    Fear of Current or Ex-Partner: No    Emotionally Abused: No    Physically Abused: No    Sexually Abused: No    Family History  Problem Relation Age of Onset   Liver cancer Sister    Spina bifida Paternal Grandfather      Current Outpatient Medications:    acetaminophen (TYLENOL) 500 MG tablet, Take 2 tablets (1,000 mg total) by mouth 3 (three) times daily. (Patient not taking: Reported on 10/15/2022), Disp: 30 tablet, Rfl: 0    ALPRAZolam (XANAX) 1 MG tablet, Take 1 mg by mouth 2 (two) times daily., Disp: , Rfl:    budesonide-formoterol (SYMBICORT) 160-4.5 MCG/ACT inhaler, Inhale 2 puffs into the lungs 2 (two) times daily as needed (shortness of breath)., Disp: , Rfl:    capecitabine (XELODA) 500 MG tablet, Take 3 tablets (1,500 mg total) by mouth 2 (two) times daily after a meal. Take for 21 days, then hold for 7 days. Repeat every 28 days., Disp: 126 tablet, Rfl: 1   KLOR-CON M20 20 MEQ tablet, Take 20 mEq by mouth 2 (two) times daily., Disp: , Rfl:    lidocaine-prilocaine (EMLA) cream, Apply to affected area once, Disp: 30 g, Rfl: 3   lisinopril (ZESTRIL) 20 MG tablet, Take 20 mg by mouth daily., Disp: , Rfl:    meloxicam (MOBIC) 15 MG tablet, Take 15 mg by mouth daily., Disp: , Rfl:    methocarbamol (ROBAXIN) 500 MG tablet, Take 2 tablets (1,000 mg total) by mouth every 6 (six) hours as needed. (Patient not taking: Reported on 11/26/2022), Disp: 60 tablet, Rfl: 0   morphine (MS CONTIN) 30 MG 12 hr tablet, Take 1 tablet (30 mg total) by mouth every 8 (eight) hours., Disp: 90 tablet, Rfl: 0   omeprazole (PRILOSEC OTC) 20 MG tablet, Take 1 tablet (20 mg total) by mouth daily., Disp: 90 tablet, Rfl: 0   ondansetron (ZOFRAN) 8 MG tablet, Take 1 tablet (8 mg total) by mouth every 8 (eight) hours as needed for nausea or vomiting., Disp: 60 tablet, Rfl: 1   Oxycodone HCl 10 MG TABS, Take 1 tablet (10 mg total) by mouth every 4 (four) hours as needed (breakthrough pain)., Disp: 20 tablet, Rfl: 0   pantoprazole (PROTONIX) 40 MG tablet, Take 1 tablet (40 mg total) by mouth daily., Disp: 60 tablet, Rfl: 0   polyethylene glycol (MIRALAX / GLYCOLAX) 17 g packet, Take 17 g by mouth daily. (Patient not taking: Reported on 11/26/2022), Disp: 14 each, Rfl: 0   prochlorperazine (COMPAZINE) 10 MG tablet, Take 1 tablet (10 mg total) by mouth every 6 (six) hours as needed for nausea or vomiting., Disp: 60 tablet, Rfl: 2   rosuvastatin (CRESTOR)  20 MG tablet, Take 20 mg by mouth daily., Disp: , Rfl:    senna (SENOKOT) 8.6 MG TABS tablet, Take 2 tablets (17.2 mg total) by mouth at bedtime. (Patient not taking: Reported on 11/26/2022), Disp: 120 tablet, Rfl: 0  Physical exam: There were no vitals filed for this visit. Physical Exam Constitutional:      Appearance: He is not ill-appearing.  Cardiovascular:     Rate and Rhythm: Regular rhythm. Bradycardia present.     Pulses: Normal pulses.  Heart sounds: Normal heart sounds. No murmur heard. Pulmonary:     Effort: Pulmonary effort is normal. No respiratory distress.  Abdominal:     General: There is no distension.     Tenderness: There is no abdominal tenderness.  Musculoskeletal:        General: No deformity.  Skin:    General: Skin is warm and dry.     Coloration: Skin is not jaundiced or pale.  Neurological:     Mental Status: He is alert and oriented to person, place, and time. Mental status is at baseline.     Motor: No weakness.  Psychiatric:        Mood and Affect: Mood normal.        Behavior: Behavior normal.        Latest Ref Rng & Units 11/26/2022    8:44 AM  CMP  Glucose 70 - 99 mg/dL 478   BUN 8 - 23 mg/dL 11   Creatinine 2.95 - 1.24 mg/dL 6.21   Sodium 308 - 657 mmol/L 136   Potassium 3.5 - 5.1 mmol/L 3.3   Chloride 98 - 111 mmol/L 102   CO2 22 - 32 mmol/L 26   Calcium 8.9 - 10.3 mg/dL 8.6   Total Protein 6.5 - 8.1 g/dL 7.2   Total Bilirubin 0.3 - 1.2 mg/dL 0.4   Alkaline Phos 38 - 126 U/L 56   AST 15 - 41 U/L 20   ALT 0 - 44 U/L 16       Latest Ref Rng & Units 11/26/2022    8:44 AM  CBC  WBC 4.0 - 10.5 K/uL 10.6   Hemoglobin 13.0 - 17.0 g/dL 84.6   Hematocrit 96.2 - 52.0 % 36.5   Platelets 150 - 400 K/uL 488     No images are attached to the encounter.  No results found.  Assessment and plan- Patient is a 62 y.o. male   Possible medication reaction or side effect- unclear what caused his symptoms but they have resolved. Will monitor.  If recurrent, recommend additional evaluation and workup.  Bradycardia- new onset in past month. Question if medication induced vs other etiologies? No evidence of heart block on ECG. Recommend evaluation with cardiology; patient in agreement. He is on several mediations that can cause QT prolongation, however, none noted on ecg. Xeloda on hold for now given GI issues and recommend judicious use of zofran.   Disposition;  Referral to cardiology Follow up with Dr Orlie Dakin as scheduled. Notify clinic if symptoms recur.    Visit Diagnosis 1. Bradycardia   2. Pancreatic adenocarcinoma [facility])    Patient expressed understanding and was in agreement with this plan. He also understands that He can call clinic at any time with any questions, concerns, or complaints.   Thank you for allowing me to participate in the care of this very pleasant patient.   Consuello Masse, DNP, AGNP-C, AOCNP [facility] [phone]

## 2022-11-26 NOTE — Progress Notes (Signed)
[facility]  Telephone:(336) [phone] Fax:(336) [phone]  ID: David Harrell OB: Mar 14, 1960  MR#: [id number]  AOZ#:[id number]  Patient Care Team: Gracelyn Nurse, MD as PCP - General (Internal Medicine) Benita Gutter, RN as Oncology Nurse Navigator Orlie Dakin, Tollie Pizza, MD as Consulting Physician (Oncology)  CHIEF COMPLAINT: Pathologic stage IIa adenocarcinoma the pancreas.  INTERVAL HISTORY: Patient returns to clinic today for further evaluation and consideration of cycle 3, day 1 of gemcitabine and Xeloda.  Over the past week he has had increased nausea, vomiting, and diarrhea.  He also has increased weakness and fatigue.  He denies any fevers.  He also is complaining of tenderness at his port site, but states this is chronic and he failed to mention it previously.  He has no neurologic complaints.  He has no chest pain, shortness of breath, cough, or hemoptysis.  He has no abdominal pain.  He has no urinary complaints.  Patient offers no further specific complaints today.  REVIEW OF SYSTEMS:   Review of Systems  Constitutional:  Positive for malaise/fatigue. Negative for fever and weight loss.  Respiratory: Negative.  Negative for cough, hemoptysis and shortness of breath.   Cardiovascular: Negative.  Negative for chest pain and leg swelling.  Gastrointestinal:  Positive for diarrhea, nausea and vomiting. Negative for abdominal pain.  Genitourinary: Negative.  Negative for dysuria.  Musculoskeletal: Negative.  Negative for back pain.  Skin: Negative.  Negative for rash.  Neurological: Negative.  Negative for dizziness, focal weakness, weakness and headaches.  Psychiatric/Behavioral: Negative.  The patient is not nervous/anxious.     As per HPI. Otherwise, a complete review of systems is negative.  PAST MEDICAL HISTORY: Past Medical History:  Diagnosis Date   Arthritis    GERD (gastroesophageal reflux disease)    Hiatal hernia    History of kidney stones     Hypertension    Pancreatic cancer (HCC) 2024    PAST SURGICAL HISTORY: Past Surgical History:  Procedure Laterality Date   ANTERIOR CERVICAL DECOMP/DISCECTOMY FUSION     BACK SURGERY     x5   ESOPHAGOGASTRODUODENOSCOPY N/A 07/12/2022   Procedure: ESOPHAGOGASTRODUODENOSCOPY (EGD);  Surgeon: Lemar Lofty., MD;  Location: [facility];  Service: Gastroenterology;  Laterality: N/A;   ESOPHAGOGASTRODUODENOSCOPY (EGD) WITH PROPOFOL N/A 07/05/2022   Procedure: ESOPHAGOGASTRODUODENOSCOPY (EGD) WITH PROPOFOL;  Surgeon: [name], DO;  Location: [facility] ENDOSCOPY;  Service: Gastroenterology;  Laterality: N/A;   EUS N/A 07/12/2022   Procedure: UPPER ENDOSCOPIC ULTRASOUND (EUS) RADIAL;  Surgeon: Lemar Lofty., MD;  Location: [facility] ENDOSCOPY;  Service: Gastroenterology;  Laterality: N/A;   FINE NEEDLE ASPIRATION  07/12/2022   Procedure: FINE NEEDLE ASPIRATION;  Surgeon: Meridee Score, Netty Starring., MD;  Location: [facility];  Service: Gastroenterology;;   IR IMAGING GUIDED PORT INSERTION  09/29/2022   LAPAROSCOPY N/A 08/18/2022   Procedure: STAGING LAPAROSCOPY;  Surgeon: Fritzi Mandes, MD;  Location: [facility] OR;  Service: General;  Laterality: N/A;   OPERATIVE ULTRASOUND N/A 08/18/2022   Procedure: INTRAOPERATIVE ULTRASOUND;  Surgeon: Fritzi Mandes, MD;  Location: [facility] OR;  Service: General;  Laterality: N/A;   SHOULDER ARTHROSCOPY Bilateral    possibly metal in right shoulder, pt unsure   SPLENECTOMY, TOTAL N/A 08/18/2022   Procedure: SPLENECTOMY;  Surgeon: Fritzi Mandes, MD;  Location: [facility] OR;  Service: General;  Laterality: N/A;    FAMILY HISTORY: Family History  Problem Relation Age of Onset   Liver cancer Sister    Spina bifida Paternal Grandfather  ADVANCED DIRECTIVES (Y/N):  N  HEALTH MAINTENANCE: Social History   Tobacco Use   Smoking status: Every Day    Current packs/day: 0.50    Average packs/day: 0.5 packs/day for 40.0 years (20.0 ttl pk-yrs)    Types:  Cigarettes   Smokeless tobacco: Never  Vaping Use   Vaping status: Never Used  Substance Use Topics   Alcohol use: Not Currently   Drug use: Yes    Frequency: 1.0 times per week    Types: Marijuana     Colonoscopy:  PAP:  Bone density:  Lipid panel:  Allergies  Allergen Reactions   Aspirin     Acid reflux     Pregabalin     Dizziness   Tape     Blisters skin, paper tape is ok   Codeine Palpitations    headaches   Oxymorphone Rash   Penicillins Hives and Rash   Vicodin [Hydrocodone-Acetaminophen] Palpitations    Headaches     Current Outpatient Medications  Medication Sig Dispense Refill   ALPRAZolam (XANAX) 1 MG tablet Take 1 mg by mouth 2 (two) times daily.     budesonide-formoterol (SYMBICORT) 160-4.5 MCG/ACT inhaler Inhale 2 puffs into the lungs 2 (two) times daily as needed (shortness of breath).     capecitabine (XELODA) 500 MG tablet Take 3 tablets (1,500 mg total) by mouth 2 (two) times daily after a meal. Take for 21 days, then hold for 7 days. Repeat every 28 days. 126 tablet 1   KLOR-CON M20 20 MEQ tablet Take 20 mEq by mouth 2 (two) times daily.     lidocaine-prilocaine (EMLA) cream Apply to affected area once 30 g 3   lisinopril (ZESTRIL) 20 MG tablet Take 20 mg by mouth daily.     meloxicam (MOBIC) 15 MG tablet Take 15 mg by mouth daily.     morphine (MS CONTIN) 30 MG 12 hr tablet Take 1 tablet (30 mg total) by mouth every 8 (eight) hours. 90 tablet 0   omeprazole (PRILOSEC OTC) 20 MG tablet Take 1 tablet (20 mg total) by mouth daily. 90 tablet 0   ondansetron (ZOFRAN) 8 MG tablet Take 1 tablet (8 mg total) by mouth every 8 (eight) hours as needed for nausea or vomiting. 60 tablet 1   Oxycodone HCl 10 MG TABS Take 1 tablet (10 mg total) by mouth every 4 (four) hours as needed (breakthrough pain). 20 tablet 0   pantoprazole (PROTONIX) 40 MG tablet Take 1 tablet (40 mg total) by mouth daily. 60 tablet 0   prochlorperazine (COMPAZINE) 10 MG tablet Take 1  tablet (10 mg total) by mouth every 6 (six) hours as needed for nausea or vomiting. 60 tablet 2   rosuvastatin (CRESTOR) 20 MG tablet Take 20 mg by mouth daily.     acetaminophen (TYLENOL) 500 MG tablet Take 2 tablets (1,000 mg total) by mouth 3 (three) times daily. (Patient not taking: Reported on 10/15/2022) 30 tablet 0   methocarbamol (ROBAXIN) 500 MG tablet Take 2 tablets (1,000 mg total) by mouth every 6 (six) hours as needed. (Patient not taking: Reported on 11/26/2022) 60 tablet 0   polyethylene glycol (MIRALAX / GLYCOLAX) 17 g packet Take 17 g by mouth daily. (Patient not taking: Reported on 11/26/2022) 14 each 0   senna (SENOKOT) 8.6 MG TABS tablet Take 2 tablets (17.2 mg total) by mouth at bedtime. (Patient not taking: Reported on 11/26/2022) 120 tablet 0   No current facility-administered medications for this visit.  Facility-Administered Medications Ordered in Other Visits  Medication Dose Route Frequency Provider Last Rate Last Admin   0.9 %  sodium chloride infusion   Intravenous Once Jeralyn Ruths, MD 999 mL/hr at 11/26/22 0954 New Bag at 11/26/22 0954   potassium chloride 20 mEq in 100 mL IVPB  20 mEq Intravenous Once Jeralyn Ruths, MD 100 mL/hr at 11/26/22 0957 20 mEq at 11/26/22 0957    OBJECTIVE: Vitals:   11/26/22 0904  BP: (!) 141/73  Pulse: (!) 48  Resp: 16  Temp: 98.4 F (36.9 C)  SpO2: 96%       Body mass index is 20.8 kg/m.    ECOG FS:0 - Asymptomatic  General: Well-developed, well-nourished, no acute distress. Eyes: Pink conjunctiva, anicteric sclera. HEENT: Normocephalic, moist mucous membranes. Lungs: No audible wheezing or coughing. Heart: Regular rate and rhythm. Abdomen: Soft, nontender, no obvious distention. Musculoskeletal: No edema, cyanosis, or clubbing. Neuro: Alert, answering all questions appropriately. Cranial nerves grossly intact. Skin: No rashes or petechiae noted. Psych: Normal affect.  LAB RESULTS:  Lab Results  Component  Value Date   NA 136 11/26/2022   K 3.3 (L) 11/26/2022   CL 102 11/26/2022   CO2 26 11/26/2022   GLUCOSE 170 (H) 11/26/2022   BUN 11 11/26/2022   CREATININE 0.80 11/26/2022   CALCIUM 8.6 (L) 11/26/2022   PROT 7.2 11/26/2022   ALBUMIN 4.2 11/26/2022   AST 20 11/26/2022   ALT 16 11/26/2022   ALKPHOS 56 11/26/2022   BILITOT 0.4 11/26/2022   GFRNONAA >60 11/26/2022   GFRAA >60 04/13/2018    Lab Results  Component Value Date   WBC 10.6 (H) 11/26/2022   NEUTROABS 5.8 11/26/2022   HGB 12.1 (L) 11/26/2022   HCT 36.5 (L) 11/26/2022   MCV 93.4 11/26/2022   PLT 488 (H) 11/26/2022     STUDIES: No results found.  ASSESSMENT: Pathologic stage IIa adenocarcinoma the pancreas.  PLAN:    Pathologic stage IIa adenocarcinoma the pancreas: Patient underwent surgical resection on August 18, 2022 confirming stage of disease.  Previously, PET scan did not reveal any metastatic disease.  Preoperatively patient's CA 19-9 was 130, currently within normal limits at 34.  Recommendation was to pursue adjuvant chemotherapy using gemcitabine and capecitabine.  Patient will receive weekly gemcitabine on days 1, 8, and 15 with oral capecitabine days 1 through 21.  Patient will have a 7-day break.  Plan on 4-6 cycles.  Delay cycle 3, day 1 of treatment today given patient's gastrointestinal issues.  Patient will instead receive IV fluids, potassium, and Zofran.  Return to clinic in approximately 1 week for reconsideration of cycle 3, day 1.  Pain: Patient does not complain of this today.  Patient has been on chronic narcotics for greater than 20 years.  Currently on MS Contin with as needed oxycodone.   Hypokalemia: Potassium 3.1 today.  IV potassium as above today. Nausea/vomiting: Hold treatment as above.  Patient gave IV fluids today.   Anemia: Mild, monitor. Thrombocytosis: Likely reactive, monitor. Leukocytosis: Likely reactive. Port tenderness: No apparent erythema.  Monitor.  I spent a total of 30  minutes reviewing chart data, face-to-face evaluation with the patient, counseling and coordination of care as detailed above.    Patient expressed understanding and was in agreement with this plan. He also understands that He can call clinic at any time with any questions, concerns, or complaints.    Cancer Staging  Pancreatic adenocarcinoma [facility]) Staging form: Exocrine Pancreas, AJCC 8th Edition -  Clinical stage from 07/16/2022: Stage IIA (cT3, cN0, cM0) - Signed by Jeralyn Ruths, MD on 07/16/2022 Stage prefix: Initial diagnosis Total positive nodes: 0   Jeralyn Ruths, MD   11/26/2022 10:40 AM

## 2022-11-27 ENCOUNTER — Encounter: Payer: Self-pay | Admitting: Oncology

## 2022-11-29 ENCOUNTER — Inpatient Hospital Stay: Payer: Medicare HMO

## 2022-11-29 NOTE — Progress Notes (Signed)
[facility] CSW Progress Note  Clinical Child psychotherapist contacted patient's wife, Darrel Reach, and informed her that the caregiver support program has been cancelled due to lack of attendees.  Offered individual counseling, but she declined at this time.  Will inform her of future programs.    Dorothey Baseman, LCSW Clinical Social Worker [facility]

## 2022-12-02 ENCOUNTER — Other Ambulatory Visit: Payer: Self-pay | Admitting: Oncology

## 2022-12-02 ENCOUNTER — Inpatient Hospital Stay: Payer: Medicare HMO | Admitting: Oncology

## 2022-12-02 ENCOUNTER — Inpatient Hospital Stay: Payer: Medicare HMO

## 2022-12-02 DIAGNOSIS — C259 Malignant neoplasm of pancreas, unspecified: Secondary | ICD-10-CM

## 2022-12-02 MED ORDER — METHOCARBAMOL 500 MG PO TABS: 1000.0000 mg | ORAL_TABLET | Freq: Four times a day (QID) | ORAL | 0 refills | Status: AC | PRN

## 2022-12-02 NOTE — Progress Notes (Signed)
[facility]  Telephone:(336) [phone] Fax:(336) [phone]  ID: David Harrell OB: 12-17-1960  MR#: [id number]  AOZ#:[id number]  Patient Care Team: Gracelyn Nurse, MD as PCP - General (Internal Medicine) Benita Gutter, RN as Oncology Nurse Navigator Jeralyn Ruths, MD as Consulting Physician (Oncology)  I connected with David Harrell on 12/02/22 at  2:15 PM EDT by video enabled telemedicine visit and verified that I am speaking with the correct person using two identifiers.   I discussed the limitations, risks, security and privacy concerns of performing an evaluation and management service by telemedicine and the availability of in-person appointments. I also discussed with the patient that there may be a patient responsible charge related to this service. The patient expressed understanding and agreed to proceed.   Other persons participating in the visit and their role in the encounter: Patient, MD.  Patient's location: Home. Provider's location: Clinic.  CHIEF COMPLAINT: Pathologic stage IIa adenocarcinoma the pancreas.  INTERVAL HISTORY: Video visit was initially attempted, but then transition to phone.  Patient returns to clinic today for further evaluation and reconsideration of cycle 3, day 1 of gemcitabine and Xeloda.  He has nausea, vomiting, diarrhea have resolved.  He no longer complains of any weakness or fatigue.  He has no neurologic complaints.  He has no chest pain, shortness of breath, cough, or hemoptysis.  He denies any nausea, vomiting, constipation, or diarrhea.  He has no abdominal pain.  He has no urinary complaints.  Patient offers no specific complaints today.  REVIEW OF SYSTEMS:   Review of Systems  Constitutional: Negative.  Negative for fever, malaise/fatigue and weight loss.  Respiratory: Negative.  Negative for cough, hemoptysis and shortness of breath.   Cardiovascular: Negative.  Negative for chest pain and leg swelling.   Gastrointestinal: Negative.  Negative for abdominal pain, diarrhea, nausea and vomiting.  Genitourinary: Negative.  Negative for dysuria.  Musculoskeletal: Negative.  Negative for back pain.  Skin: Negative.  Negative for rash.  Neurological: Negative.  Negative for dizziness, focal weakness, weakness and headaches.  Psychiatric/Behavioral: Negative.  The patient is not nervous/anxious.     As per HPI. Otherwise, a complete review of systems is negative.  PAST MEDICAL HISTORY: Past Medical History:  Diagnosis Date   Arthritis    GERD (gastroesophageal reflux disease)    Hiatal hernia    History of kidney stones    Hypertension    Pancreatic cancer (HCC) 2024    PAST SURGICAL HISTORY: Past Surgical History:  Procedure Laterality Date   ANTERIOR CERVICAL DECOMP/DISCECTOMY FUSION     BACK SURGERY     x5   ESOPHAGOGASTRODUODENOSCOPY N/A 07/12/2022   Procedure: ESOPHAGOGASTRODUODENOSCOPY (EGD);  Surgeon: Lemar Lofty., MD;  Location: [facility];  Service: Gastroenterology;  Laterality: N/A;   ESOPHAGOGASTRODUODENOSCOPY (EGD) WITH PROPOFOL N/A 07/05/2022   Procedure: ESOPHAGOGASTRODUODENOSCOPY (EGD) WITH PROPOFOL;  Surgeon: [name], DO;  Location: [facility] ENDOSCOPY;  Service: Gastroenterology;  Laterality: N/A;   EUS N/A 07/12/2022   Procedure: UPPER ENDOSCOPIC ULTRASOUND (EUS) RADIAL;  Surgeon: Lemar Lofty., MD;  Location: [facility] ENDOSCOPY;  Service: Gastroenterology;  Laterality: N/A;   FINE NEEDLE ASPIRATION  07/12/2022   Procedure: FINE NEEDLE ASPIRATION;  Surgeon: Meridee Score Netty Starring., MD;  Location: [facility];  Service: Gastroenterology;;   IR IMAGING GUIDED PORT INSERTION  09/29/2022   LAPAROSCOPY N/A 08/18/2022   Procedure: STAGING LAPAROSCOPY;  Surgeon: Fritzi Mandes, MD;  Location: [facility] OR;  Service: General;  Laterality: N/A;  OPERATIVE ULTRASOUND N/A 08/18/2022   Procedure: INTRAOPERATIVE ULTRASOUND;  Surgeon: Fritzi Mandes, MD;   Location: [facility] OR;  Service: General;  Laterality: N/A;   SHOULDER ARTHROSCOPY Bilateral    possibly metal in right shoulder, pt unsure   SPLENECTOMY, TOTAL N/A 08/18/2022   Procedure: SPLENECTOMY;  Surgeon: Fritzi Mandes, MD;  Location: [facility] OR;  Service: General;  Laterality: N/A;    FAMILY HISTORY: Family History  Problem Relation Age of Onset   Liver cancer Sister    Spina bifida Paternal Grandfather     ADVANCED DIRECTIVES (Y/N):  N  HEALTH MAINTENANCE: Social History   Tobacco Use   Smoking status: Every Day    Current packs/day: 0.50    Average packs/day: 0.5 packs/day for 40.0 years (20.0 ttl pk-yrs)    Types: Cigarettes   Smokeless tobacco: Never  Vaping Use   Vaping status: Never Used  Substance Use Topics   Alcohol use: Not Currently   Drug use: Yes    Frequency: 1.0 times per week    Types: Marijuana     Colonoscopy:  PAP:  Bone density:  Lipid panel:  Allergies  Allergen Reactions   Aspirin     Acid reflux     Pregabalin     Dizziness   Tape     Blisters skin, paper tape is ok   Codeine Palpitations    headaches   Oxymorphone Rash   Penicillins Hives and Rash   Vicodin [Hydrocodone-Acetaminophen] Palpitations    Headaches     Current Outpatient Medications  Medication Sig Dispense Refill   acetaminophen (TYLENOL) 500 MG tablet Take 2 tablets (1,000 mg total) by mouth 3 (three) times daily. (Patient not taking: Reported on 10/15/2022) 30 tablet 0   ALPRAZolam (XANAX) 1 MG tablet Take 1 mg by mouth 2 (two) times daily.     budesonide-formoterol (SYMBICORT) 160-4.5 MCG/ACT inhaler Inhale 2 puffs into the lungs 2 (two) times daily as needed (shortness of breath).     capecitabine (XELODA) 500 MG tablet Take 3 tablets (1,500 mg total) by mouth 2 (two) times daily after a meal. Take for 21 days, then hold for 7 days. Repeat every 28 days. 126 tablet 1   lidocaine-prilocaine (EMLA) cream Apply to affected area once 30 g 3   lisinopril (ZESTRIL) 20 MG  tablet Take 20 mg by mouth daily.     meloxicam (MOBIC) 15 MG tablet Take 15 mg by mouth daily.     methocarbamol (ROBAXIN) 500 MG tablet Take 2 tablets (1,000 mg total) by mouth every 6 (six) hours as needed. 60 tablet 0   morphine (MS CONTIN) 30 MG 12 hr tablet Take 1 tablet (30 mg total) by mouth every 8 (eight) hours. 90 tablet 0   omeprazole (PRILOSEC OTC) 20 MG tablet Take 1 tablet (20 mg total) by mouth daily. 90 tablet 0   ondansetron (ZOFRAN) 8 MG tablet Take 1 tablet (8 mg total) by mouth every 8 (eight) hours as needed for nausea or vomiting. 60 tablet 1   Oxycodone HCl 10 MG TABS Take 1 tablet (10 mg total) by mouth every 4 (four) hours as needed (breakthrough pain). 20 tablet 0   pantoprazole (PROTONIX) 40 MG tablet Take 1 tablet (40 mg total) by mouth daily. 60 tablet 0   polyethylene glycol (MIRALAX / GLYCOLAX) 17 g packet Take 17 g by mouth daily. (Patient not taking: Reported on 11/26/2022) 14 each 0   potassium chloride SA (KLOR-CON M20) 20 MEQ tablet  Take 1 tablet (20 mEq total) by mouth daily. 60 tablet 1   prochlorperazine (COMPAZINE) 10 MG tablet Take 1 tablet (10 mg total) by mouth every 6 (six) hours as needed for nausea or vomiting. 60 tablet 2   rosuvastatin (CRESTOR) 20 MG tablet Take 20 mg by mouth daily.     senna (SENOKOT) 8.6 MG TABS tablet Take 2 tablets (17.2 mg total) by mouth at bedtime. (Patient not taking: Reported on 11/26/2022) 120 tablet 0   No current facility-administered medications for this visit.    OBJECTIVE: There were no vitals filed for this visit.      There is no height or weight on file to calculate BMI.    ECOG FS:0 - Asymptomatic  General: Well-developed, well-nourished, no acute distress. Eyes: Pink conjunctiva, anicteric sclera. HEENT: Normocephalic, moist mucous membranes. Lungs: No audible wheezing or coughing. Heart: Regular rate and rhythm. Abdomen: Soft, nontender, no obvious distention. Musculoskeletal: No edema, cyanosis, or  clubbing. Neuro: Alert, answering all questions appropriately. Cranial nerves grossly intact. Skin: No rashes or petechiae noted. Psych: Normal affect.  LAB RESULTS:  Lab Results  Component Value Date   NA 136 11/26/2022   K 3.3 (L) 11/26/2022   CL 102 11/26/2022   CO2 26 11/26/2022   GLUCOSE 170 (H) 11/26/2022   BUN 11 11/26/2022   CREATININE 0.80 11/26/2022   CALCIUM 8.6 (L) 11/26/2022   PROT 7.2 11/26/2022   ALBUMIN 4.2 11/26/2022   AST 20 11/26/2022   ALT 16 11/26/2022   ALKPHOS 56 11/26/2022   BILITOT 0.4 11/26/2022   GFRNONAA >60 11/26/2022   GFRAA >60 04/13/2018    Lab Results  Component Value Date   WBC 10.6 (H) 11/26/2022   NEUTROABS 5.8 11/26/2022   HGB 12.1 (L) 11/26/2022   HCT 36.5 (L) 11/26/2022   MCV 93.4 11/26/2022   PLT 488 (H) 11/26/2022     STUDIES: No results found.  ASSESSMENT: Pathologic stage IIa adenocarcinoma the pancreas.  PLAN:    Pathologic stage IIa adenocarcinoma the pancreas: Patient underwent surgical resection on August 18, 2022 confirming stage of disease.  Previously, PET scan did not reveal any metastatic disease.  Preoperatively patient's CA 19-9 was 130, currently within normal limits at 34.  Recommendation was to pursue adjuvant chemotherapy using gemcitabine and capecitabine.  Patient will receive weekly gemcitabine on days 1, 8, and 15 with oral capecitabine days 1 through 21.  Patient will have a 7-day break.  Plan on 4-6 cycles.  Provided laboratory work is sufficient, patient will return to clinic tomorrow for consideration of cycle 3, day 1 of treatment.  Return to clinic in 1 week for further evaluation and consideration of cycle 3, day 8.   Pain: Patient does not complain of this today.  Patient has been on chronic narcotics for greater than 20 years.  Currently on MS Contin with as needed oxycodone.   Hypokalemia: Patient's most recent potassium was 3.1.  He received IV potassium last week.  Repeat laboratory work tomorrow.   Nausea/vomiting: Resolved. Port tenderness: No apparent erythema.  Monitor.  I provided 20 minutes of face-to-face video visit time during this encounter which included chart review, counseling, and coordination of care as documented above.     Patient expressed understanding and was in agreement with this plan. He also understands that He can call clinic at any time with any questions, concerns, or complaints.    Cancer Staging  Pancreatic adenocarcinoma [facility]) Staging form: Exocrine Pancreas, AJCC 8th Edition -  Clinical stage from 07/16/2022: Stage IIA (cT3, cN0, cM0) - Signed by Jeralyn Ruths, MD on 07/16/2022 Stage prefix: Initial diagnosis Total positive nodes: 0   Jeralyn Ruths, MD   12/02/2022 2:47 PM

## 2022-12-02 NOTE — Telephone Encounter (Signed)
Component Ref Range & Units 6 d ago (11/26/22) 2 wk ago (11/12/22) 3 wk ago (11/05/22) 1 mo ago (10/29/22) 1 mo ago (10/15/22) 1 mo ago (10/08/22) 2 mo ago (10/01/22)  Potassium 3.5 - 5.1 mmol/L 3.3 Low  3.6 3.5 3.8 3.8 4.2 3.1 Low

## 2022-12-03 ENCOUNTER — Inpatient Hospital Stay: Payer: Medicare HMO

## 2022-12-03 ENCOUNTER — Ambulatory Visit: Payer: Medicare HMO

## 2022-12-03 ENCOUNTER — Telehealth: Payer: Self-pay | Admitting: Oncology

## 2022-12-03 ENCOUNTER — Other Ambulatory Visit: Payer: Medicare HMO

## 2022-12-03 ENCOUNTER — Ambulatory Visit: Payer: Medicare HMO | Admitting: Oncology
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
## 2022-12-06 ENCOUNTER — Encounter: Payer: Self-pay | Admitting: Oncology

## 2022-12-06 ENCOUNTER — Other Ambulatory Visit: Payer: Self-pay | Admitting: Oncology

## 2022-12-06 ENCOUNTER — Telehealth: Payer: Self-pay | Admitting: *Deleted

## 2022-12-06 DIAGNOSIS — C259 Malignant neoplasm of pancreas, unspecified: Secondary | ICD-10-CM

## 2022-12-06 NOTE — Telephone Encounter (Signed)
Call returned to patient and informed not to restart Xeloda until he is seen on Friday. He repeated this back to me

## 2022-12-06 NOTE — Telephone Encounter (Signed)
Patient called asking when he is to restart his Xeloda. He missed his appointment Friday and has a follow up this Friday with Dr Orlie Dakin. Pease advise

## 2022-12-09 ENCOUNTER — Encounter: Payer: Self-pay | Admitting: Oncology

## 2022-12-10 ENCOUNTER — Ambulatory Visit: Payer: Medicare HMO

## 2022-12-10 ENCOUNTER — Inpatient Hospital Stay: Payer: Medicare HMO | Attending: Oncology | Admitting: Oncology

## 2022-12-10 ENCOUNTER — Inpatient Hospital Stay: Payer: Medicare HMO

## 2022-12-10 ENCOUNTER — Other Ambulatory Visit: Payer: Medicare HMO

## 2022-12-10 ENCOUNTER — Encounter: Payer: Self-pay | Admitting: Oncology

## 2022-12-10 ENCOUNTER — Ambulatory Visit: Payer: Medicare HMO | Admitting: Oncology

## 2022-12-10 VITALS — BP 139/58 | HR 51 | Resp 18

## 2022-12-10 DIAGNOSIS — C252 Malignant neoplasm of tail of pancreas: Secondary | ICD-10-CM | POA: Insufficient documentation

## 2022-12-10 DIAGNOSIS — C259 Malignant neoplasm of pancreas, unspecified: Secondary | ICD-10-CM

## 2022-12-10 DIAGNOSIS — Z5111 Encounter for antineoplastic chemotherapy: Secondary | ICD-10-CM | POA: Diagnosis not present

## 2022-12-10 DIAGNOSIS — Z79899 Other long term (current) drug therapy: Secondary | ICD-10-CM | POA: Insufficient documentation

## 2022-12-10 LAB — CMP (CANCER CENTER ONLY)
ALT: 15 U/L (ref 0–44)
AST: 19 U/L (ref 15–41)
Albumin: 4.4 g/dL (ref 3.5–5.0)
Alkaline Phosphatase: 65 U/L (ref 38–126)
Anion gap: 7 (ref 5–15)
BUN: 10 mg/dL (ref 8–23)
CO2: 25 mmol/L (ref 22–32)
Calcium: 8.9 mg/dL (ref 8.9–10.3)
Chloride: 105 mmol/L (ref 98–111)
Creatinine: 0.71 mg/dL (ref 0.61–1.24)
GFR, Estimated: >60 mL/min (ref >60)
Glucose, Bld: 129 mg/dL — ABNORMAL HIGH (ref 70–99)
Potassium: 3.8 mmol/L (ref 3.5–5.1)
Sodium: 137 mmol/L (ref 135–145)
Total Bilirubin: 0.6 mg/dL (ref 0.3–1.2)
Total Protein: 7.5 g/dL (ref 6.5–8.1)

## 2022-12-10 LAB — CBC WITH DIFFERENTIAL (CANCER CENTER ONLY)
Abs Immature Granulocytes: 0.04 10*3/uL (ref 0.00–0.07)
Basophils Absolute: 0.2 10*3/uL — ABNORMAL HIGH (ref 0.0–0.1)
Basophils Relative: 2 %
Eosinophils Absolute: 0.3 10*3/uL (ref 0.0–0.5)
Eosinophils Relative: 3 %
HCT: 40 % (ref 39.0–52.0)
Hemoglobin: 13.4 g/dL (ref 13.0–17.0)
Immature Granulocytes: 0 %
Lymphocytes Relative: 31 %
Lymphs Abs: 3.2 10*3/uL (ref 0.7–4.0)
MCH: 30.9 pg (ref 26.0–34.0)
MCHC: 33.5 g/dL (ref 30.0–36.0)
MCV: 92.4 fL (ref 80.0–100.0)
Monocytes Absolute: 0.9 10*3/uL (ref 0.1–1.0)
Monocytes Relative: 8 %
Neutro Abs: 5.8 10*3/uL (ref 1.7–7.7)
Neutrophils Relative %: 56 %
Platelet Count: 404 10*3/uL — ABNORMAL HIGH (ref 150–400)
RBC: 4.33 MIL/uL (ref 4.22–5.81)
RDW: 20.6 % — ABNORMAL HIGH (ref 11.5–15.5)
WBC Count: 10.4 10*3/uL (ref 4.0–10.5)
nRBC: 0 % (ref 0.0–0.2)

## 2022-12-10 MED ORDER — SODIUM CHLORIDE 0.9 % IV SOLN
1000.0000 mg/m2 | Freq: Once | INTRAVENOUS | Status: AC
  Administered 2022-12-10: 1786 mg via INTRAVENOUS
  Filled 2022-12-10: qty 46.97

## 2022-12-10 MED ORDER — HEPARIN SOD (PORK) LOCK FLUSH 100 UNIT/ML IV SOLN
500.0000 [IU] | Freq: Once | INTRAVENOUS | Status: AC | PRN
  Administered 2022-12-10: 500 [IU]
  Filled 2022-12-10: qty 5

## 2022-12-10 MED ORDER — SODIUM CHLORIDE 0.9 % IV SOLN
Freq: Once | INTRAVENOUS | Status: AC
  Filled 2022-12-10: qty 250

## 2022-12-10 MED ORDER — SODIUM CHLORIDE 0.9% FLUSH
10.0000 mL | INTRAVENOUS | Status: DC | PRN
  Administered 2022-12-10: 10 mL
  Filled 2022-12-10: qty 10

## 2022-12-10 MED ORDER — PROCHLORPERAZINE MALEATE 10 MG PO TABS
10.0000 mg | ORAL_TABLET | Freq: Once | ORAL | Status: AC
  Administered 2022-12-10: 10 mg via ORAL
  Filled 2022-12-10: qty 1

## 2022-12-10 NOTE — Progress Notes (Signed)
Nutrition Follow-up:  Patient with pancreatic cancer.  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  Patient receiving gemcitabine and xeloda.  Met with patient during infusion.  Reports that he is not happy with his weight loss.  Says that he can eat about anything he wants without causing stomach upset.  Oral nutrition supplements are to pricey for him to purchase.   Medications: reviewed  Labs: reviewed  Anthropometrics:   Weight 135 lb 3.2 oz today 139 lb 9.6 oz on 8/30 145 lb 14.4 oz on 8/2 144 lb 12.8 oz on 7/11 143 lb on 5/30 prior to surgery   NUTRITION DIAGNOSIS: Inadequate oral intake continues    INTERVENTION:  Samples of carnation breakfast essentials provided today for patient to try.  Encouraged mixing with whole milk for more calories.   Recipes of shakes and high calorie dishes provided to patient to try.      MONITORING, EVALUATION, GOAL: weight trends, intake   NEXT VISIT: Friday, Oct 18 during infusion  Dashiell Franchino B. Freida Busman, RD, LDN Registered Dietitian [phone]
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
## 2022-12-14 ENCOUNTER — Other Ambulatory Visit: Payer: Self-pay | Admitting: Oncology

## 2022-12-15 ENCOUNTER — Other Ambulatory Visit: Payer: Self-pay
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
## 2022-12-15 NOTE — Progress Notes (Signed)
Specialty Pharmacy Refill Coordination Note  David Harrell is a 62 y.o. male contacted today regarding refills of specialty medication(s) Capecitabine   Patient requested Delivery   Delivery date: 01/04/23   Verified address: [address] [place] [address]   Medication will be filled on 01/03/23.

## 2022-12-16 ENCOUNTER — Telehealth: Payer: Self-pay | Admitting: Oncology

## 2022-12-16 NOTE — Telephone Encounter (Signed)
Patient called and said he needed a refill on his morphine. I told him I would get the message to Dr. Irving Copas.

## 2022-12-17 ENCOUNTER — Inpatient Hospital Stay: Payer: Medicare HMO

## 2022-12-17 ENCOUNTER — Other Ambulatory Visit: Payer: Self-pay | Admitting: *Deleted

## 2022-12-17 ENCOUNTER — Encounter: Payer: Self-pay | Admitting: Oncology

## 2022-12-17 ENCOUNTER — Other Ambulatory Visit: Payer: Self-pay | Admitting: Oncology

## 2022-12-17 ENCOUNTER — Inpatient Hospital Stay: Payer: Medicare HMO | Admitting: Hospice and Palliative Medicine

## 2022-12-17 ENCOUNTER — Other Ambulatory Visit: Payer: Medicare HMO

## 2022-12-17 ENCOUNTER — Ambulatory Visit: Payer: Medicare HMO | Admitting: Oncology

## 2022-12-17 ENCOUNTER — Ambulatory Visit: Payer: Medicare HMO

## 2022-12-17 VITALS — BP 118/73 | HR 58 | Temp 97.6°F | Resp 18

## 2022-12-17 DIAGNOSIS — Z23 Encounter for immunization: Secondary | ICD-10-CM

## 2022-12-17 DIAGNOSIS — C259 Malignant neoplasm of pancreas, unspecified: Secondary | ICD-10-CM | POA: Diagnosis not present

## 2022-12-17 DIAGNOSIS — Z79899 Other long term (current) drug therapy: Secondary | ICD-10-CM | POA: Diagnosis not present

## 2022-12-17 DIAGNOSIS — G893 Neoplasm related pain (acute) (chronic): Secondary | ICD-10-CM | POA: Diagnosis not present

## 2022-12-17 DIAGNOSIS — Z515 Encounter for palliative care: Secondary | ICD-10-CM | POA: Diagnosis not present

## 2022-12-17 DIAGNOSIS — Z5111 Encounter for antineoplastic chemotherapy: Secondary | ICD-10-CM | POA: Diagnosis not present

## 2022-12-17 DIAGNOSIS — C252 Malignant neoplasm of tail of pancreas: Secondary | ICD-10-CM | POA: Diagnosis not present

## 2022-12-17 LAB — CMP (CANCER CENTER ONLY)
ALT: 15 U/L (ref 0–44)
AST: 22 U/L (ref 15–41)
Albumin: 4.5 g/dL (ref 3.5–5.0)
Alkaline Phosphatase: 63 U/L (ref 38–126)
Anion gap: 8 (ref 5–15)
BUN: 13 mg/dL (ref 8–23)
CO2: 25 mmol/L (ref 22–32)
Calcium: 9.1 mg/dL (ref 8.9–10.3)
Chloride: 101 mmol/L (ref 98–111)
Creatinine: 0.74 mg/dL (ref 0.61–1.24)
GFR, Estimated: >60 mL/min (ref >60)
Glucose, Bld: 214 mg/dL — ABNORMAL HIGH (ref 70–99)
Potassium: 3.6 mmol/L (ref 3.5–5.1)
Sodium: 134 mmol/L — ABNORMAL LOW (ref 135–145)
Total Bilirubin: 0.6 mg/dL (ref 0.3–1.2)
Total Protein: 7.9 g/dL (ref 6.5–8.1)

## 2022-12-17 LAB — CBC WITH DIFFERENTIAL (CANCER CENTER ONLY)
Abs Immature Granulocytes: 0.04 10*3/uL (ref 0.00–0.07)
Basophils Absolute: 0.1 10*3/uL (ref 0.0–0.1)
Basophils Relative: 1 %
Eosinophils Absolute: 0.1 10*3/uL (ref 0.0–0.5)
Eosinophils Relative: 1 %
HCT: 38.4 % — ABNORMAL LOW (ref 39.0–52.0)
Hemoglobin: 13.1 g/dL (ref 13.0–17.0)
Immature Granulocytes: 1 %
Lymphocytes Relative: 49 %
Lymphs Abs: 4.2 10*3/uL — ABNORMAL HIGH (ref 0.7–4.0)
MCH: 31 pg (ref 26.0–34.0)
MCHC: 34.1 g/dL (ref 30.0–36.0)
MCV: 91 fL (ref 80.0–100.0)
Monocytes Absolute: 0.5 10*3/uL (ref 0.1–1.0)
Monocytes Relative: 6 %
Neutro Abs: 3.6 10*3/uL (ref 1.7–7.7)
Neutrophils Relative %: 42 %
Platelet Count: 244 10*3/uL (ref 150–400)
RBC: 4.22 MIL/uL (ref 4.22–5.81)
RDW: 19.2 % — ABNORMAL HIGH (ref 11.5–15.5)
WBC Count: 8.6 10*3/uL (ref 4.0–10.5)
nRBC: 0.2 % (ref 0.0–0.2)

## 2022-12-17 MED ORDER — HEPARIN SOD (PORK) LOCK FLUSH 100 UNIT/ML IV SOLN
500.0000 [IU] | Freq: Once | INTRAVENOUS | Status: AC | PRN
  Administered 2022-12-17: 500 [IU]
  Filled 2022-12-17: qty 5

## 2022-12-17 MED ORDER — SODIUM CHLORIDE 0.9 % IV SOLN
1000.0000 mg/m2 | Freq: Once | INTRAVENOUS | Status: AC
  Administered 2022-12-17: 1786 mg via INTRAVENOUS
  Filled 2022-12-17: qty 46.97

## 2022-12-17 MED ORDER — SODIUM CHLORIDE 0.9 % IV SOLN
Freq: Once | INTRAVENOUS | Status: AC
  Filled 2022-12-17: qty 250

## 2022-12-17 MED ORDER — PROCHLORPERAZINE MALEATE 10 MG PO TABS
10.0000 mg | ORAL_TABLET | Freq: Once | ORAL | Status: AC
  Administered 2022-12-17: 10 mg via ORAL
  Filled 2022-12-17: qty 1

## 2022-12-17 MED ORDER — MORPHINE SULFATE ER 30 MG PO TBCR: 30.0000 mg | EXTENDED_RELEASE_TABLET | Freq: Three times a day (TID) | ORAL | 0 refills | Status: DC

## 2022-12-17 NOTE — Patient Instructions (Signed)
[facility]  Discharge Instructions: Thank you for choosing [facility] to provide your oncology and hematology care.  If you have a lab appointment with the Cancer Center, please go directly to the Cancer Center and check in at the registration area.  Wear comfortable clothing and clothing appropriate for easy access to any Portacath or PICC line.   We strive to give you quality time with your provider. You may need to reschedule your appointment if you arrive late (15 or more minutes).  Arriving late affects you and other patients whose appointments are after yours.  Also, if you miss three or more appointments without notifying the office, you may be dismissed from the clinic at the provider's discretion.      For prescription refill requests, have your pharmacy contact our office and allow 72 hours for refills to be completed.    Today you received the following chemotherapy and/or immunotherapy agents gemzar      To help prevent nausea and vomiting after your treatment, we encourage you to take your nausea medication as directed.  BELOW ARE SYMPTOMS THAT SHOULD BE REPORTED IMMEDIATELY: *FEVER GREATER THAN 100.4 F (38 C) OR HIGHER *CHILLS OR SWEATING *NAUSEA AND VOMITING THAT IS NOT CONTROLLED WITH YOUR NAUSEA MEDICATION *UNUSUAL SHORTNESS OF BREATH *UNUSUAL BRUISING OR BLEEDING *URINARY PROBLEMS (pain or burning when urinating, or frequent urination) *BOWEL PROBLEMS (unusual diarrhea, constipation, pain near the anus) TENDERNESS IN MOUTH AND THROAT WITH OR WITHOUT PRESENCE OF ULCERS (sore throat, sores in mouth, or a toothache) UNUSUAL RASH, SWELLING OR PAIN  UNUSUAL VAGINAL DISCHARGE OR ITCHING   Items with * indicate a potential emergency and should be followed up as soon as possible or go to the Emergency Department if any problems should occur.  Please show the CHEMOTHERAPY ALERT CARD or IMMUNOTHERAPY ALERT CARD at check-in to the  Emergency Department and triage nurse.  Should you have questions after your visit or need to cancel or reschedule your appointment, please contact [facility]  [phone] and follow the prompts.  Office hours are 8:00 a.m. to 4:30 p.m. Monday - Friday. Please note that voicemails left after 4:00 p.m. may not be returned until the following business day.  We are closed weekends and major holidays. You have access to a nurse at all times for urgent questions. Please call the main number to the clinic [phone] and follow the prompts.  For any non-urgent questions, you may also contact your provider using MyChart. We now offer e-Visits for anyone 65 and older to request care online for non-urgent symptoms. For details visit [email or web address].   Also download the MyChart app! Go to the app store, search "MyChart", open the app, select [facility], and log in with your MyChart username and password.

## 2022-12-17 NOTE — Progress Notes (Signed)
Virtual Visit via Telephone Note  I connected with David Harrell on 12/17/22 at  3:00 PM EDT by telephone and verified that I am speaking with the correct person using two identifiers.  Location: Patient: Home Provider: Clinic   I discussed the limitations, risks, security and privacy concerns of performing an evaluation and management service by telephone and the availability of in person appointments. I also discussed with the patient that there may be a patient responsible charge related to this service. The patient expressed understanding and agreed to proceed.   History of Present Illness: David Harrell is a 61 y.o. 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  Patient has history of chronic pain on opioids for many years.  He was referred to palliative care to address goals and manage ongoing symptoms.    Observations/Objective: I called and spoke with patient by phone.  Patient reports he is doing well.  Denies any significant changes or concerns.  No symptomatic complaints at present.  Reports stable appetite and performance status.  Pain is reportedly stable on current regimen of MS Contin and oxycodone.  He currently rates pain as 4 out of 10.  Denies any adverse effects from pain medications.  Of note, he is on chronic opioids for decades and previously followed by pain clinic.  Assessment and Plan: Stage IIa pancreatic cancer -on chemotherapy with gemcitabine and Xeloda  Chronic pain -continue regimen of MS Contin/oxycodone  Follow Up Instructions: Follow-up telephone visit 1 to 2 months   I discussed the assessment and treatment plan with the patient. The patient was provided an opportunity to ask questions and all were answered. The patient agreed with the plan and demonstrated an understanding of the instructions.   The patient was advised to call back or seek an in-person  evaluation if the symptoms worsen or if the condition fails to improve as anticipated.  I provided 5 minutes of non-face-to-face time during this encounter.   Malachy Moan, NP

## 2022-12-24 ENCOUNTER — Inpatient Hospital Stay: Payer: Medicare HMO

## 2022-12-24 ENCOUNTER — Ambulatory Visit: Payer: Medicare HMO

## 2022-12-24 ENCOUNTER — Encounter: Payer: Self-pay | Admitting: Oncology

## 2022-12-24 ENCOUNTER — Inpatient Hospital Stay (HOSPITAL_BASED_OUTPATIENT_CLINIC_OR_DEPARTMENT_OTHER): Payer: Medicare HMO | Admitting: Oncology

## 2022-12-24 ENCOUNTER — Ambulatory Visit: Payer: Medicare HMO | Admitting: Oncology

## 2022-12-24 ENCOUNTER — Other Ambulatory Visit: Payer: Medicare HMO

## 2022-12-24 VITALS — BP 123/68 | HR 60

## 2022-12-24 VITALS — BP 118/81 | HR 62 | Temp 97.9°F | Resp 16 | Ht 68.0 in | Wt 129.8 lb

## 2022-12-24 DIAGNOSIS — Z95828 Presence of other vascular implants and grafts: Secondary | ICD-10-CM

## 2022-12-24 DIAGNOSIS — C259 Malignant neoplasm of pancreas, unspecified: Secondary | ICD-10-CM

## 2022-12-24 DIAGNOSIS — Z5111 Encounter for antineoplastic chemotherapy: Secondary | ICD-10-CM | POA: Diagnosis not present

## 2022-12-24 DIAGNOSIS — C252 Malignant neoplasm of tail of pancreas: Secondary | ICD-10-CM | POA: Diagnosis not present

## 2022-12-24 DIAGNOSIS — Z79899 Other long term (current) drug therapy: Secondary | ICD-10-CM | POA: Diagnosis not present

## 2022-12-24 LAB — CBC WITH DIFFERENTIAL (CANCER CENTER ONLY)
Abs Immature Granulocytes: 0.01 10*3/uL (ref 0.00–0.07)
Basophils Absolute: 0.1 10*3/uL (ref 0.0–0.1)
Basophils Relative: 1 %
Eosinophils Absolute: 0.1 10*3/uL (ref 0.0–0.5)
Eosinophils Relative: 2 %
HCT: 35.9 % — ABNORMAL LOW (ref 39.0–52.0)
Hemoglobin: 12.5 g/dL — ABNORMAL LOW (ref 13.0–17.0)
Immature Granulocytes: 0 %
Lymphocytes Relative: 49 %
Lymphs Abs: 2.4 10*3/uL (ref 0.7–4.0)
MCH: 31.8 pg (ref 26.0–34.0)
MCHC: 34.8 g/dL (ref 30.0–36.0)
MCV: 91.3 fL (ref 80.0–100.0)
Monocytes Absolute: 0.5 10*3/uL (ref 0.1–1.0)
Monocytes Relative: 10 %
Neutro Abs: 1.8 10*3/uL (ref 1.7–7.7)
Neutrophils Relative %: 38 %
Platelet Count: 148 10*3/uL — ABNORMAL LOW (ref 150–400)
RBC: 3.93 MIL/uL — ABNORMAL LOW (ref 4.22–5.81)
RDW: 18.6 % — ABNORMAL HIGH (ref 11.5–15.5)
WBC Count: 4.8 10*3/uL (ref 4.0–10.5)
nRBC: 0.6 % — ABNORMAL HIGH (ref 0.0–0.2)

## 2022-12-24 LAB — CMP (CANCER CENTER ONLY)
ALT: 18 U/L (ref 0–44)
AST: 21 U/L (ref 15–41)
Albumin: 4.6 g/dL (ref 3.5–5.0)
Alkaline Phosphatase: 60 U/L (ref 38–126)
Anion gap: 10 (ref 5–15)
BUN: 11 mg/dL (ref 8–23)
CO2: 24 mmol/L (ref 22–32)
Calcium: 9 mg/dL (ref 8.9–10.3)
Chloride: 101 mmol/L (ref 98–111)
Creatinine: 0.91 mg/dL (ref 0.61–1.24)
GFR, Estimated: >60 mL/min (ref >60)
Glucose, Bld: 192 mg/dL — ABNORMAL HIGH (ref 70–99)
Potassium: 3.3 mmol/L — ABNORMAL LOW (ref 3.5–5.1)
Sodium: 135 mmol/L (ref 135–145)
Total Bilirubin: 0.8 mg/dL (ref 0.3–1.2)
Total Protein: 7.8 g/dL (ref 6.5–8.1)

## 2022-12-24 MED ORDER — SODIUM CHLORIDE 0.9 % IV SOLN
Freq: Once | INTRAVENOUS | Status: AC
  Filled 2022-12-24: qty 250

## 2022-12-24 MED ORDER — SODIUM CHLORIDE 0.9 % IV SOLN
Freq: Once | INTRAVENOUS | Status: DC
Start: 2022-12-24 — End: 2022-12-24

## 2022-12-24 MED ORDER — DEXAMETHASONE SODIUM PHOSPHATE 10 MG/ML IJ SOLN
10.0000 mg | Freq: Once | INTRAMUSCULAR | Status: AC
  Administered 2022-12-24: 10 mg via INTRAVENOUS
  Filled 2022-12-24: qty 1

## 2022-12-24 MED ORDER — ONDANSETRON HCL 4 MG/2ML IJ SOLN
8.0000 mg | Freq: Once | INTRAMUSCULAR | Status: AC
  Administered 2022-12-24: 8 mg via INTRAVENOUS
  Filled 2022-12-24: qty 4

## 2022-12-24 MED ORDER — HEPARIN SOD (PORK) LOCK FLUSH 100 UNIT/ML IV SOLN
500.0000 [IU] | Freq: Once | INTRAVENOUS | Status: AC | PRN
  Administered 2022-12-24: 500 [IU]
  Filled 2022-12-24: qty 5

## 2022-12-24 MED ORDER — SODIUM CHLORIDE 0.9 % IV SOLN
1000.0000 mg/m2 | Freq: Once | INTRAVENOUS | Status: AC
  Administered 2022-12-24: 1786 mg via INTRAVENOUS
  Filled 2022-12-24: qty 46.97

## 2022-12-24 NOTE — Progress Notes (Signed)
Still having issues with port. Having burning pain that goes up his neck from port site.

## 2022-12-24 NOTE — Progress Notes (Signed)
Nutrition Follow-up:  Patient with pancreatic cancer.  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  Patient receiving gemcitabine and xeloda.   Met with patient and wife during infusion.  Reports that he has had trouble with nausea.  Has been taking nausea pills.  Tried the carnation instant breakfast mixed with milk but it caused some stomach upset.  Yesterday able to eat pancakes, ham sandwich and potato soup  Medications: compazine, zofran  Labs: glucose 192, K 3.3  Anthropometrics:   Weight 129 lb 12.8 oz  135 lb 3.2 oz on 10/4 139 lb on 8/30 145 lb on 8/2 144 lb on 7/11 143 lb on 5/30 prior to surgery   NUTRITION DIAGNOSIS: Inadequate oral intake continues   INTERVENTION:  Encouraged trying carnation instant breakfast with lactaid milk.  Can try equate plus as well, cheaper than ensure.  Continue taking nausea medication to help treat symptom     MONITORING, EVALUATION, GOAL: weight trends, intake   NEXT VISIT: Friday, Nov 8 during infusion  Revis Whalin B. Freida Busman, RD, LDN Registered Dietitian [phone]

## 2022-12-24 NOTE — Patient Instructions (Signed)

## 2022-12-24 NOTE — Progress Notes (Signed)
[facility]  Telephone:([phone]) [phone] Fax:(336) [phone]  ID: David Harrell OB: 03-Jul-1960  MR#: [id number]  MVH#:[id number]  Patient Care Team: Gracelyn Nurse, MD as PCP - General (Internal Medicine) Benita Gutter, RN as Oncology Nurse Navigator Orlie Dakin, Tollie Pizza, MD as Consulting Physician (Oncology)   CHIEF COMPLAINT: Pathologic stage IIa adenocarcinoma the pancreas.  INTERVAL HISTORY: Patient returns to clinic today for further evaluation and consideration of cycle 3, day 15 of gemcitabine and Xeloda.  He continues to complain of a burning sensation at the site of his port.  He also has noted increased nausea and fatigue today.  He denies any recent fevers or illnesses.  He has no neurologic complaints.  He has no chest pain, shortness of breath, cough, or hemoptysis.  He denies any vomiting, constipation, or diarrhea.  He has no abdominal pain.  He has no urinary complaints.  Patient offers no further specific complaints today.  REVIEW OF SYSTEMS:   Review of Systems  Constitutional: Negative.  Negative for fever, malaise/fatigue and weight loss.  Respiratory: Negative.  Negative for cough, hemoptysis and shortness of breath.   Cardiovascular: Negative.  Negative for chest pain and leg swelling.  Gastrointestinal: Negative.  Negative for abdominal pain, diarrhea, nausea and vomiting.  Genitourinary: Negative.  Negative for dysuria.  Musculoskeletal: Negative.  Negative for back pain.  Skin: Negative.  Negative for rash.  Neurological: Negative.  Negative for dizziness, focal weakness, weakness and headaches.  Psychiatric/Behavioral: Negative.  The patient is not nervous/anxious.     As per HPI. Otherwise, a complete review of systems is negative.  PAST MEDICAL HISTORY: Past Medical History:  Diagnosis Date   Arthritis    GERD (gastroesophageal reflux disease)    Hiatal hernia    History of kidney stones    Hypertension    Pancreatic cancer (HCC)  2024    PAST SURGICAL HISTORY: Past Surgical History:  Procedure Laterality Date   ANTERIOR CERVICAL DECOMP/DISCECTOMY FUSION     BACK SURGERY     x5   ESOPHAGOGASTRODUODENOSCOPY N/A 07/12/2022   Procedure: ESOPHAGOGASTRODUODENOSCOPY (EGD);  Surgeon: Lemar Lofty., MD;  Location: [facility];  Service: Gastroenterology;  Laterality: N/A;   ESOPHAGOGASTRODUODENOSCOPY (EGD) WITH PROPOFOL N/A 07/05/2022   Procedure: ESOPHAGOGASTRODUODENOSCOPY (EGD) WITH PROPOFOL;  Surgeon: [name], DO;  Location: [facility] ENDOSCOPY;  Service: Gastroenterology;  Laterality: N/A;   EUS N/A 07/12/2022   Procedure: UPPER ENDOSCOPIC ULTRASOUND (EUS) RADIAL;  Surgeon: Lemar Lofty., MD;  Location: [facility] ENDOSCOPY;  Service: Gastroenterology;  Laterality: N/A;   FINE NEEDLE ASPIRATION  07/12/2022   Procedure: FINE NEEDLE ASPIRATION;  Surgeon: Meridee Score, Netty Starring., MD;  Location: [facility];  Service: Gastroenterology;;   IR IMAGING GUIDED PORT INSERTION  09/29/2022   LAPAROSCOPY N/A 08/18/2022   Procedure: STAGING LAPAROSCOPY;  Surgeon: Fritzi Mandes, MD;  Location: [facility] OR;  Service: General;  Laterality: N/A;   OPERATIVE ULTRASOUND N/A 08/18/2022   Procedure: INTRAOPERATIVE ULTRASOUND;  Surgeon: Fritzi Mandes, MD;  Location: [facility] OR;  Service: General;  Laterality: N/A;   SHOULDER ARTHROSCOPY Bilateral    possibly metal in right shoulder, pt unsure   SPLENECTOMY, TOTAL N/A 08/18/2022   Procedure: SPLENECTOMY;  Surgeon: Fritzi Mandes, MD;  Location: [facility] OR;  Service: General;  Laterality: N/A;    FAMILY HISTORY: Family History  Problem Relation Age of Onset   Liver cancer Sister    Spina bifida Paternal Grandfather     ADVANCED DIRECTIVES (Y/N):  N  HEALTH MAINTENANCE: Social History   Tobacco Use   Smoking status: Every Day    Current packs/day: 0.50    Average packs/day: 0.5 packs/day for 40.0 years (20.0 ttl pk-yrs)    Types: Cigarettes   Smokeless tobacco: Never   Vaping Use   Vaping status: Never Used  Substance Use Topics   Alcohol use: Not Currently   Drug use: Yes    Frequency: 1.0 times per week    Types: Marijuana     Colonoscopy:  PAP:  Bone density:  Lipid panel:  Allergies  Allergen Reactions   Aspirin     Acid reflux     Pregabalin     Dizziness   Tape     Blisters skin, paper tape is ok   Codeine Palpitations    headaches   Oxymorphone Rash   Penicillins Hives and Rash   Vicodin [Hydrocodone-Acetaminophen] Palpitations    Headaches     Current Outpatient Medications  Medication Sig Dispense Refill   ALPRAZolam (XANAX) 1 MG tablet Take 1 mg by mouth 2 (two) times daily.     budesonide-formoterol (SYMBICORT) 160-4.5 MCG/ACT inhaler Inhale 2 puffs into the lungs 2 (two) times daily as needed (shortness of breath).     capecitabine (XELODA) 500 MG tablet Take 3 tablets (1,500 mg total) by mouth 2 (two) times daily after a meal. Take for 21 days, then hold for 7 days. Repeat every 28 days. 126 tablet 1   lidocaine-prilocaine (EMLA) cream Apply to affected area once 30 g 3   lisinopril (ZESTRIL) 20 MG tablet Take 20 mg by mouth daily.     meloxicam (MOBIC) 15 MG tablet Take 15 mg by mouth daily.     methocarbamol (ROBAXIN) 500 MG tablet Take 2 tablets (1,000 mg total) by mouth every 6 (six) hours as needed. 60 tablet 0   morphine (MS CONTIN) 30 MG 12 hr tablet Take 1 tablet (30 mg total) by mouth every 8 (eight) hours. 90 tablet 0   omeprazole (PRILOSEC OTC) 20 MG tablet Take 1 tablet (20 mg total) by mouth daily. 90 tablet 0   ondansetron (ZOFRAN) 8 MG tablet Take 1 tablet (8 mg total) by mouth every 8 (eight) hours as needed for nausea or vomiting. 60 tablet 1   Oxycodone HCl 10 MG TABS Take 1 tablet (10 mg total) by mouth every 4 (four) hours as needed (breakthrough pain). 20 tablet 0   pantoprazole (PROTONIX) 40 MG tablet Take 1 tablet (40 mg total) by mouth daily. 60 tablet 0   potassium chloride SA (KLOR-CON M20) 20  MEQ tablet Take 1 tablet (20 mEq total) by mouth daily. 60 tablet 1   prochlorperazine (COMPAZINE) 10 MG tablet Take 1 tablet (10 mg total) by mouth every 6 (six) hours as needed for nausea or vomiting. 60 tablet 2   rosuvastatin (CRESTOR) 20 MG tablet Take 20 mg by mouth daily.     acetaminophen (TYLENOL) 500 MG tablet Take 2 tablets (1,000 mg total) by mouth 3 (three) times daily. (Patient not taking: Reported on 10/15/2022) 30 tablet 0   polyethylene glycol (MIRALAX / GLYCOLAX) 17 g packet Take 17 g by mouth daily. (Patient not taking: Reported on 11/26/2022) 14 each 0   senna (SENOKOT) 8.6 MG TABS tablet Take 2 tablets (17.2 mg total) by mouth at bedtime. (Patient not taking: Reported on 11/26/2022) 120 tablet 0   No current facility-administered medications for this visit.   Facility-Administered Medications Ordered in Other Visits  Medication  Dose Route Frequency Provider Last Rate Last Admin   0.9 %  sodium chloride infusion   Intravenous Once Jeralyn Ruths, MD       gemcitabine (GEMZAR) 1,786 mg in sodium chloride 0.9 % 250 mL chemo infusion  1,000 mg/m2 (Treatment Plan Recorded) Intravenous Once Jeralyn Ruths, MD       ondansetron (ZOFRAN) 8 mg, dexamethasone (DECADRON) 10 mg in sodium chloride 0.9 % 50 mL IVPB   Intravenous Once Jeralyn Ruths, MD        OBJECTIVE: Vitals:   12/24/22 0937  BP: 118/81  Pulse: 62  Resp: 16  Temp: 97.9 F (36.6 C)  SpO2: 100%        Body mass index is 19.74 kg/m.    ECOG FS:1 - Symptomatic but completely ambulatory  General: Well-developed, well-nourished, no acute distress. Eyes: Pink conjunctiva, anicteric sclera. HEENT: Normocephalic, moist mucous membranes. Lungs: No audible wheezing or coughing. Heart: Regular rate and rhythm. Abdomen: Soft, nontender, no obvious distention. Musculoskeletal: No edema, cyanosis, or clubbing. Neuro: Alert, answering all questions appropriately. Cranial nerves grossly intact. Skin: No  rashes or petechiae noted. Psych: Normal affect.  LAB RESULTS:  Lab Results  Component Value Date   NA 135 12/24/2022   K 3.3 (L) 12/24/2022   CL 101 12/24/2022   CO2 24 12/24/2022   GLUCOSE 192 (H) 12/24/2022   BUN 11 12/24/2022   CREATININE 0.91 12/24/2022   CALCIUM 9.0 12/24/2022   PROT 7.8 12/24/2022   ALBUMIN 4.6 12/24/2022   AST 21 12/24/2022   ALT 18 12/24/2022   ALKPHOS 60 12/24/2022   BILITOT 0.8 12/24/2022   GFRNONAA >60 12/24/2022   GFRAA >60 04/13/2018    Lab Results  Component Value Date   WBC 4.8 12/24/2022   NEUTROABS 1.8 12/24/2022   HGB 12.5 (L) 12/24/2022   HCT 35.9 (L) 12/24/2022   MCV 91.3 12/24/2022   PLT 148 (L) 12/24/2022     STUDIES: No results found.  ASSESSMENT: Pathologic stage IIa adenocarcinoma the pancreas.  PLAN:    Pathologic stage IIa adenocarcinoma the pancreas: Patient underwent surgical resection on August 18, 2022 confirming stage of disease.  Previously, PET scan did not reveal any metastatic disease.  Preoperatively patient's CA 19-9 was 130, currently within normal limits at 34.  Recommendation was to pursue adjuvant chemotherapy using gemcitabine and capecitabine.  Patient will receive weekly gemcitabine on days 1, 8, and 15 with oral capecitabine days 1 through 21.  Patient will have a 7-day break.  Initial plan was to do 4-6 cycles, but given the cumulative effects of treatment patient will complete cycle 4 and then discontinue treatment.  Proceed with cycle 3, day 15 today.  Return to clinic in 2 weeks for further evaluation and consideration of cycle 4, day 1.    Pain: Patient does not complain of this today.  Patient has been on chronic narcotics for greater than 20 years.  Currently on MS Contin with as needed oxycodone.   Hypokalemia: Chronic and unchanged.  Patient's potassium is 3.3 today.  Continue oral potassium supplementation.   Hyperglycemia: Chronic and changed. Thrombocytopenia: Mild, monitor. Nausea: Will give  patient IV Zofran and dexamethasone with treatment today. Port tenderness: No apparent erythema.  Will get dye study to further evaluate.  Patient expressed understanding and was in agreement with this plan. He also understands that He can call clinic at any time with any questions, concerns, or complaints.    Cancer Staging  Pancreatic adenocarcinoma (  HCC) Staging form: Exocrine Pancreas, AJCC 8th Edition - Clinical stage from 07/16/2022: Stage IIA (cT3, cN0, cM0) - Signed by Jeralyn Ruths, MD on 07/16/2022 Stage prefix: Initial diagnosis Total positive nodes: 0   Jeralyn Ruths, MD   12/24/2022 10:54 AM

## 2022-12-27 ENCOUNTER — Ambulatory Visit
Admission: RE | Admit: 2022-12-27 | Discharge: 2022-12-27 | Disposition: A | Discharge status: Home / Self Care | Payer: Medicare HMO | Source: Ambulatory Visit | Attending: Oncology | Admitting: Oncology

## 2022-12-30 ENCOUNTER — Ambulatory Visit
Admission: RE | Admit: 2022-12-30 | Discharge: 2022-12-30 | Disposition: A | Discharge status: Home / Self Care | Payer: Medicare HMO | Source: Ambulatory Visit | Attending: Oncology | Admitting: Oncology

## 2022-12-30 DIAGNOSIS — Z452 Encounter for adjustment and management of vascular access device: Secondary | ICD-10-CM | POA: Insufficient documentation

## 2022-12-30 DIAGNOSIS — C259 Malignant neoplasm of pancreas, unspecified: Secondary | ICD-10-CM | POA: Insufficient documentation

## 2022-12-30 DIAGNOSIS — Z95828 Presence of other vascular implants and grafts: Secondary | ICD-10-CM

## 2022-12-30 HISTORY — PX: IR CV LINE INJECTION: IMG2294

## 2022-12-30 MED ORDER — HEPARIN SOD (PORK) LOCK FLUSH 100 UNIT/ML IV SOLN
INTRAVENOUS | Status: AC
  Filled 2022-12-30: qty 5

## 2022-12-30 MED ORDER — HEPARIN SOD (PORK) LOCK FLUSH 100 UNIT/ML IV SOLN
500.0000 [IU] | Freq: Once | INTRAVENOUS | Status: AC
  Administered 2022-12-30: 500 [IU] via INTRAVENOUS

## 2022-12-30 MED ORDER — IOHEXOL 300 MG/ML  SOLN
12.0000 mL | Freq: Once | INTRAMUSCULAR | Status: AC | PRN
  Administered 2022-12-30: 12 mL via INTRAVENOUS

## 2022-12-31 ENCOUNTER — Other Ambulatory Visit: Payer: Medicare HMO

## 2022-12-31 ENCOUNTER — Ambulatory Visit: Payer: Medicare HMO | Admitting: Oncology

## 2022-12-31 ENCOUNTER — Ambulatory Visit: Payer: Medicare HMO

## 2023-01-03 ENCOUNTER — Other Ambulatory Visit: Payer: Self-pay

## 2023-01-07 ENCOUNTER — Inpatient Hospital Stay: Payer: Medicare HMO

## 2023-01-07 ENCOUNTER — Other Ambulatory Visit: Payer: Self-pay | Admitting: Pharmacist

## 2023-01-07 ENCOUNTER — Inpatient Hospital Stay: Payer: Medicare HMO | Admitting: Pharmacist

## 2023-01-07 ENCOUNTER — Inpatient Hospital Stay (HOSPITAL_BASED_OUTPATIENT_CLINIC_OR_DEPARTMENT_OTHER): Payer: Medicare HMO | Admitting: Oncology

## 2023-01-07 ENCOUNTER — Inpatient Hospital Stay: Payer: Medicare HMO | Attending: Oncology

## 2023-01-07 ENCOUNTER — Encounter: Payer: Self-pay | Admitting: Oncology

## 2023-01-07 DIAGNOSIS — C252 Malignant neoplasm of tail of pancreas: Secondary | ICD-10-CM | POA: Insufficient documentation

## 2023-01-07 DIAGNOSIS — Z23 Encounter for immunization: Secondary | ICD-10-CM | POA: Insufficient documentation

## 2023-01-07 DIAGNOSIS — Z79899 Other long term (current) drug therapy: Secondary | ICD-10-CM | POA: Insufficient documentation

## 2023-01-07 DIAGNOSIS — C259 Malignant neoplasm of pancreas, unspecified: Secondary | ICD-10-CM

## 2023-01-07 DIAGNOSIS — Z5111 Encounter for antineoplastic chemotherapy: Secondary | ICD-10-CM | POA: Diagnosis not present

## 2023-01-07 LAB — CMP (CANCER CENTER ONLY)
ALT: 13 U/L (ref 0–44)
AST: 17 U/L (ref 15–41)
Albumin: 4.1 g/dL (ref 3.5–5.0)
Alkaline Phosphatase: 63 U/L (ref 38–126)
Anion gap: 8 (ref 5–15)
BUN: 10 mg/dL (ref 8–23)
CO2: 26 mmol/L (ref 22–32)
Calcium: 8.8 mg/dL — ABNORMAL LOW (ref 8.9–10.3)
Chloride: 102 mmol/L (ref 98–111)
Creatinine: 0.71 mg/dL (ref 0.61–1.24)
GFR, Estimated: >60 mL/min (ref >60)
Glucose, Bld: 140 mg/dL — ABNORMAL HIGH (ref 70–99)
Potassium: 3.9 mmol/L (ref 3.5–5.1)
Sodium: 136 mmol/L (ref 135–145)
Total Bilirubin: 0.5 mg/dL (ref 0.3–1.2)
Total Protein: 7 g/dL (ref 6.5–8.1)

## 2023-01-07 LAB — CBC WITH DIFFERENTIAL (CANCER CENTER ONLY)
Abs Immature Granulocytes: 0.04 10*3/uL (ref 0.00–0.07)
Basophils Absolute: 0.1 10*3/uL (ref 0.0–0.1)
Basophils Relative: 1 %
Eosinophils Absolute: 0.3 10*3/uL (ref 0.0–0.5)
Eosinophils Relative: 4 %
HCT: 35.4 % — ABNORMAL LOW (ref 39.0–52.0)
Hemoglobin: 12.1 g/dL — ABNORMAL LOW (ref 13.0–17.0)
Immature Granulocytes: 1 %
Lymphocytes Relative: 31 %
Lymphs Abs: 2.7 10*3/uL (ref 0.7–4.0)
MCH: 32.7 pg (ref 26.0–34.0)
MCHC: 34.2 g/dL (ref 30.0–36.0)
MCV: 95.7 fL (ref 80.0–100.0)
Monocytes Absolute: 1 10*3/uL (ref 0.1–1.0)
Monocytes Relative: 11 %
Neutro Abs: 4.5 10*3/uL (ref 1.7–7.7)
Neutrophils Relative %: 52 %
Platelet Count: 463 10*3/uL — ABNORMAL HIGH (ref 150–400)
RBC: 3.7 MIL/uL — ABNORMAL LOW (ref 4.22–5.81)
RDW: 21.4 % — ABNORMAL HIGH (ref 11.5–15.5)
WBC Count: 8.6 10*3/uL (ref 4.0–10.5)
nRBC: 0.7 % — ABNORMAL HIGH (ref 0.0–0.2)

## 2023-01-07 MED ORDER — SODIUM CHLORIDE 0.9 % IV SOLN
1000.0000 mg/m2 | Freq: Once | INTRAVENOUS | Status: AC
  Administered 2023-01-07: 1786 mg via INTRAVENOUS
  Filled 2023-01-07: qty 46.97

## 2023-01-07 MED ORDER — SODIUM CHLORIDE 0.9 % IV SOLN
Freq: Once | INTRAVENOUS | Status: AC
  Filled 2023-01-07: qty 250

## 2023-01-07 MED ORDER — HEPARIN SOD (PORK) LOCK FLUSH 100 UNIT/ML IV SOLN
500.0000 [IU] | Freq: Once | INTRAVENOUS | Status: DC | PRN
Start: 2023-01-07 — End: 2023-01-07
  Filled 2023-01-07: qty 5

## 2023-01-07 MED ORDER — PROCHLORPERAZINE MALEATE 10 MG PO TABS
10.0000 mg | ORAL_TABLET | Freq: Once | ORAL | Status: AC
  Administered 2023-01-07: 10 mg via ORAL
  Filled 2023-01-07: qty 1

## 2023-01-07 NOTE — Progress Notes (Signed)
Clinical Pharmacist Practitioner- Telephone Visit [facility]  [phone] [phone]    I connected withNAME@ on 01/07/23 at  1:00 PM EDT by telephone and verified that I am speaking with the correct person using two identifiers.  Name of the [name]  [id number]  Jan 06, 1961   Location: Patient: at pharmacy Provider: at home   I discussed the limitations, risks, security and privacy concerns of performing an evaluation and management service by telephone and the availability of in person appointments. I also discussed with the patient that there may be a patient responsible charge related to this service. The patient expressed understanding and agreed to proceed.  HPI: Patient is a 62 y.o. male with stage IIa pancreatic cancer. Currently receiving adjuvant treatment with capecitabine and gemcitabine.   Reason for Consult: Oral chemotherapy follow-up for capecitabine therapy.   PAST MEDICAL HISTORY: Past Medical History:  Diagnosis Date   Arthritis    GERD (gastroesophageal reflux disease)    Hiatal hernia    History of kidney stones    Hypertension    Pancreatic cancer (HCC) 2024    HEMATOLOGY/ONCOLOGY HISTORY:  Oncology History  Pancreatic adenocarcinoma (HCC)  07/16/2022 Initial Diagnosis   Pancreatic adenocarcinoma (HCC)   07/16/2022 Cancer Staging   Staging form: Exocrine Pancreas, AJCC 8th Edition - Clinical stage from 07/16/2022: Stage IIA (cT3, cN0, cM0) - Signed by Jeralyn Ruths, MD on 07/16/2022 Stage prefix: Initial diagnosis Total positive nodes: 0   10/01/2022 -  Chemotherapy   Patient is on Treatment Plan : PANCREAS Gemcitabine D1,8,15 (1000) + Capecitabine D1-21 q28d x 6 cycles     10/10/2022 Genetic Testing   Negative genetic testing. No pathogenic variants identified on the [facility] Common Hereditary Cancers+RNA panel. The report date is 10/10/2022.  The Common Hereditary Cancers Panel + RNA offered by  [facility] includes sequencing and/or deletion duplication testing of the following 48 genes: APC*, ATM*, AXIN2, BAP1, BARD1, BMPR1A, BRCA1, BRCA2, BRIP1, CDH1, CDK4, CDKN2A (p14ARF), CDKN2A (p16INK4a), CHEK2, CTNNA1, DICER1*, EPCAM*, FH*, GREM1*, HOXB13, KIT, MBD4, MEN1*, MLH1*, MSH2*, MSH3*, MSH6*, MUTYH, NF1*, NTHL1, PALB2, PDGFRA, PMS2*, POLD1*, POLE, PTEN*, RAD51C, RAD51D, SDHA*, SDHB, SDHC*, SDHD, SMAD4, SMARCA4, STK11, TP53, TSC1*, TSC2, VHL.      ALLERGIES:  is allergic to aspirin, pregabalin, tape, codeine, oxymorphone, penicillins, and vicodin [hydrocodone-acetaminophen].  MEDICATIONS:  Current Outpatient Medications  Medication Sig Dispense Refill   acetaminophen (TYLENOL) 500 MG tablet Take 2 tablets (1,000 mg total) by mouth 3 (three) times daily. (Patient not taking: Reported on 10/15/2022) 30 tablet 0   ALPRAZolam (XANAX) 1 MG tablet Take 1 mg by mouth 2 (two) times daily.     budesonide-formoterol (SYMBICORT) 160-4.5 MCG/ACT inhaler Inhale 2 puffs into the lungs 2 (two) times daily as needed (shortness of breath).     capecitabine (XELODA) 500 MG tablet Take 3 tablets (1,500 mg total) by mouth 2 (two) times daily after a meal. Take for 21 days, then hold for 7 days. Repeat every 28 days. 126 tablet 1   lidocaine-prilocaine (EMLA) cream Apply to affected area once 30 g 3   lisinopril (ZESTRIL) 20 MG tablet Take 20 mg by mouth daily.     meloxicam (MOBIC) 15 MG tablet Take 15 mg by mouth daily.     methocarbamol (ROBAXIN) 500 MG tablet Take 2 tablets (1,000 mg total) by mouth every 6 (six) hours as needed. 60 tablet 0   morphine (MS CONTIN) 30 MG 12 hr tablet Take 1 tablet (30 mg  total) by mouth every 8 (eight) hours. 90 tablet 0   omeprazole (PRILOSEC OTC) 20 MG tablet Take 1 tablet (20 mg total) by mouth daily. 90 tablet 0   ondansetron (ZOFRAN) 8 MG tablet Take 1 tablet (8 mg total) by mouth every 8 (eight) hours as needed for nausea or vomiting. 60 tablet 1   Oxycodone HCl 10 MG TABS  Take 1 tablet (10 mg total) by mouth every 4 (four) hours as needed (breakthrough pain). 20 tablet 0   pantoprazole (PROTONIX) 40 MG tablet Take 1 tablet (40 mg total) by mouth daily. 60 tablet 0   polyethylene glycol (MIRALAX / GLYCOLAX) 17 g packet Take 17 g by mouth daily. (Patient not taking: Reported on 11/26/2022) 14 each 0   potassium chloride SA (KLOR-CON M20) 20 MEQ tablet Take 1 tablet (20 mEq total) by mouth daily. (Patient not taking: Reported on 01/07/2023) 60 tablet 1   prochlorperazine (COMPAZINE) 10 MG tablet Take 1 tablet (10 mg total) by mouth every 6 (six) hours as needed for nausea or vomiting. 60 tablet 2   rosuvastatin (CRESTOR) 20 MG tablet Take 20 mg by mouth daily.     senna (SENOKOT) 8.6 MG TABS tablet Take 2 tablets (17.2 mg total) by mouth at bedtime. (Patient not taking: Reported on 11/26/2022) 120 tablet 0   No current facility-administered medications for this visit.    VITAL SIGNS: There were no vitals taken for this visit. There were no vitals filed for this visit.  Estimated body mass index is 20.5 kg/m as calculated from the following:   Height as of an earlier encounter on 01/07/23: 5\' 8"  (1.727 m).   Weight as of an earlier encounter on 01/07/23: 61.1 kg (134 lb 12.8 oz).  LABS: CBC:    Component Value Date/Time   WBC 8.6 01/07/2023 0950   WBC 9.5 09/16/2022 1202   HGB 12.1 (L) 01/07/2023 0950   HGB 15.3 06/30/2014 1750   HCT 35.4 (L) 01/07/2023 0950   HCT 44.3 06/30/2014 1750   PLT 463 (H) 01/07/2023 0950   PLT 226 06/30/2014 1750   MCV 95.7 01/07/2023 0950   MCV 88 06/30/2014 1750   NEUTROABS 4.5 01/07/2023 0950   LYMPHSABS 2.7 01/07/2023 0950   MONOABS 1.0 01/07/2023 0950   EOSABS 0.3 01/07/2023 0950   BASOSABS 0.1 01/07/2023 0950   Comprehensive Metabolic Panel:    Component Value Date/Time   NA 136 01/07/2023 0950   NA 139 06/30/2014 1750   K 3.9 01/07/2023 0950   K 3.3 (L) 06/30/2014 1750   CL 102 01/07/2023 0950   CL 105 06/30/2014  1750   CO2 26 01/07/2023 0950   CO2 20 (L) 06/30/2014 1750   BUN 10 01/07/2023 0950   BUN 10 06/30/2014 1750   CREATININE 0.71 01/07/2023 0950   CREATININE 0.85 06/30/2014 1750   GLUCOSE 140 (H) 01/07/2023 0950   GLUCOSE 115 (H) 06/30/2014 1750   CALCIUM 8.8 (L) 01/07/2023 0950   CALCIUM 8.9 06/30/2014 1750   AST 17 01/07/2023 0950   ALT 13 01/07/2023 0950   ALKPHOS 63 01/07/2023 0950   BILITOT 0.5 01/07/2023 0950   PROT 7.0 01/07/2023 0950   ALBUMIN 4.1 01/07/2023 0950    On phone during today's visit: patient only  Assessment and Plan-  Patient to start last cycle cycle 4 of capecitabine/gemcitabine today 01/07/23.  Reviewed dosing and schedule with patient    Oral Chemotherapy Side Effect/Intolerance:  Diarrhea: Patient not currently having diarrhea, he had  diarrhea over the last few week but he was able to manage with loperamide as need Skin dryness: Patient has notice some skin dryness on his feet, no pain or blisters. Encouraged patient to keep his feet moisturized Fatigue: mild, patient is looking forwarded to having his treatment completed No reported mouth sores   Medication Access Issues: patient has medication in hand to complete his last cycle, disenrolled patient with [facility] (Special[facility])  Patient expressed understanding and was in agreement with this plan. He also understands that He can call clinic at any time with any questions, concerns, or complaints.   Follow-up plan: RTC as scheduled  Thank you for allowing me to participate in the care of this very pleasant patient.   Time Total: 10 mins of non face-to-face telephone visit time during this encounter  Visit consisted of counseling and education on dealing with issues of symptom management in the setting of serious and potentially life-threatening illness.Greater than 50%  of this time was spent counseling and coordinating care related to the above assessment and plan.   Remi Haggard,  PharmD, BCPS, BCOP, CPP Hematology/Oncology Clinical Pharmacist Practitioner [facility] [phone]  01/07/2023 2:22 PM

## 2023-01-07 NOTE — Progress Notes (Unsigned)
[facility]  Telephone:(336) [phone] Fax:(336) [phone]  ID: David Harrell OB: [birth year]  MR#: [id number]  QMV#:[id number]  Patient Care Team: Gracelyn Nurse, MD as PCP - General (Internal Medicine) Benita Gutter, RN as Oncology Nurse Navigator Orlie Dakin, Tollie Pizza, MD as Consulting Physician (Oncology)   CHIEF COMPLAINT: Pathologic stage IIa adenocarcinoma the pancreas.  INTERVAL HISTORY: Patient returns to clinic today for further evaluation and consideration of cycle 4, day 1 of gemcitabine and Xeloda.  He currently feels well and is asymptomatic.  He does not complain of any weakness or fatigue.  He denies any further nausea. He denies any recent fevers or illnesses.  He has no neurologic complaints.  He has no chest pain, shortness of breath, cough, or hemoptysis.  He denies any vomiting, constipation, or diarrhea.  He has no abdominal pain.  He has no urinary complaints.  Patient offers no specific complaints today.  REVIEW OF SYSTEMS:   Review of Systems  Constitutional: Negative.  Negative for fever, malaise/fatigue and weight loss.  Respiratory: Negative.  Negative for cough, hemoptysis and shortness of breath.   Cardiovascular: Negative.  Negative for chest pain and leg swelling.  Gastrointestinal: Negative.  Negative for abdominal pain, diarrhea, nausea and vomiting.  Genitourinary: Negative.  Negative for dysuria.  Musculoskeletal: Negative.  Negative for back pain.  Skin: Negative.  Negative for rash.  Neurological: Negative.  Negative for dizziness, focal weakness, weakness and headaches.  Psychiatric/Behavioral: Negative.  The patient is not nervous/anxious.     As per HPI. Otherwise, a complete review of systems is negative.  PAST MEDICAL HISTORY: Past Medical History:  Diagnosis Date   Arthritis    GERD (gastroesophageal reflux disease)    Hiatal hernia    History of kidney stones    Hypertension    Pancreatic cancer (HCC) 2024     PAST SURGICAL HISTORY: Past Surgical History:  Procedure Laterality Date   ANTERIOR CERVICAL DECOMP/DISCECTOMY FUSION     BACK SURGERY     x5   ESOPHAGOGASTRODUODENOSCOPY N/A 07/12/2022   Procedure: ESOPHAGOGASTRODUODENOSCOPY (EGD);  Surgeon: Lemar Lofty., MD;  Location: [facility];  Service: Gastroenterology;  Laterality: N/A;   ESOPHAGOGASTRODUODENOSCOPY (EGD) WITH PROPOFOL N/A 07/05/2022   Procedure: ESOPHAGOGASTRODUODENOSCOPY (EGD) WITH PROPOFOL;  Surgeon: [name], DO;  Location: [facility] ENDOSCOPY;  Service: Gastroenterology;  Laterality: N/A;   EUS N/A 07/12/2022   Procedure: UPPER ENDOSCOPIC ULTRASOUND (EUS) RADIAL;  Surgeon: Lemar Lofty., MD;  Location: [facility] ENDOSCOPY;  Service: Gastroenterology;  Laterality: N/A;   FINE NEEDLE ASPIRATION  07/12/2022   Procedure: FINE NEEDLE ASPIRATION;  Surgeon: Lemar Lofty., MD;  Location: [facility] ENDOSCOPY;  Service: Gastroenterology;;   IR CV LINE INJECTION  12/30/2022   IR IMAGING GUIDED PORT INSERTION  09/29/2022   LAPAROSCOPY N/A 08/18/2022   Procedure: STAGING LAPAROSCOPY;  Surgeon: Fritzi Mandes, MD;  Location: [facility] OR;  Service: General;  Laterality: N/A;   OPERATIVE ULTRASOUND N/A 08/18/2022   Procedure: INTRAOPERATIVE ULTRASOUND;  Surgeon: Fritzi Mandes, MD;  Location: [facility] OR;  Service: General;  Laterality: N/A;   SHOULDER ARTHROSCOPY Bilateral    possibly metal in right shoulder, pt unsure   SPLENECTOMY, TOTAL N/A 08/18/2022   Procedure: SPLENECTOMY;  Surgeon: Fritzi Mandes, MD;  Location: [facility] OR;  Service: General;  Laterality: N/A;    FAMILY HISTORY: Family History  Problem Relation Age of Onset   Liver cancer Sister    Spina bifida Paternal Grandfather     ADVANCED  DIRECTIVES (Y/N):  N  HEALTH MAINTENANCE: Social History   Tobacco Use   Smoking status: Every Day    Current packs/day: 0.50    Average packs/day: 0.5 packs/day for 40.0 years (20.0 ttl pk-yrs)    Types:  Cigarettes   Smokeless tobacco: Never  Vaping Use   Vaping status: Never Used  Substance Use Topics   Alcohol use: Not Currently   Drug use: Yes    Frequency: 1.0 times per week    Types: Marijuana     Colonoscopy:  PAP:  Bone density:  Lipid panel:  Allergies  Allergen Reactions   Aspirin     Acid reflux     Pregabalin     Dizziness   Tape     Blisters skin, paper tape is ok   Codeine Palpitations    headaches   Oxymorphone Rash   Penicillins Hives and Rash   Vicodin [Hydrocodone-Acetaminophen] Palpitations    Headaches     Current Outpatient Medications  Medication Sig Dispense Refill   ALPRAZolam (XANAX) 1 MG tablet Take 1 mg by mouth 2 (two) times daily.     budesonide-formoterol (SYMBICORT) 160-4.5 MCG/ACT inhaler Inhale 2 puffs into the lungs 2 (two) times daily as needed (shortness of breath).     capecitabine (XELODA) 500 MG tablet Take 3 tablets (1,500 mg total) by mouth 2 (two) times daily after a meal. Take for 21 days, then hold for 7 days. Repeat every 28 days. 126 tablet 1   lidocaine-prilocaine (EMLA) cream Apply to affected area once 30 g 3   lisinopril (ZESTRIL) 20 MG tablet Take 20 mg by mouth daily.     meloxicam (MOBIC) 15 MG tablet Take 15 mg by mouth daily.     methocarbamol (ROBAXIN) 500 MG tablet Take 2 tablets (1,000 mg total) by mouth every 6 (six) hours as needed. 60 tablet 0   morphine (MS CONTIN) 30 MG 12 hr tablet Take 1 tablet (30 mg total) by mouth every 8 (eight) hours. 90 tablet 0   omeprazole (PRILOSEC OTC) 20 MG tablet Take 1 tablet (20 mg total) by mouth daily. 90 tablet 0   ondansetron (ZOFRAN) 8 MG tablet Take 1 tablet (8 mg total) by mouth every 8 (eight) hours as needed for nausea or vomiting. 60 tablet 1   Oxycodone HCl 10 MG TABS Take 1 tablet (10 mg total) by mouth every 4 (four) hours as needed (breakthrough pain). 20 tablet 0   pantoprazole (PROTONIX) 40 MG tablet Take 1 tablet (40 mg total) by mouth daily. 60 tablet 0    prochlorperazine (COMPAZINE) 10 MG tablet Take 1 tablet (10 mg total) by mouth every 6 (six) hours as needed for nausea or vomiting. 60 tablet 2   rosuvastatin (CRESTOR) 20 MG tablet Take 20 mg by mouth daily.     acetaminophen (TYLENOL) 500 MG tablet Take 2 tablets (1,000 mg total) by mouth 3 (three) times daily. (Patient not taking: Reported on 10/15/2022) 30 tablet 0   polyethylene glycol (MIRALAX / GLYCOLAX) 17 g packet Take 17 g by mouth daily. (Patient not taking: Reported on 11/26/2022) 14 each 0   potassium chloride SA (KLOR-CON M20) 20 MEQ tablet Take 1 tablet (20 mEq total) by mouth daily. (Patient not taking: Reported on 01/07/2023) 60 tablet 1   senna (SENOKOT) 8.6 MG TABS tablet Take 2 tablets (17.2 mg total) by mouth at bedtime. (Patient not taking: Reported on 11/26/2022) 120 tablet 0   No current facility-administered medications for this  visit.    OBJECTIVE: Vitals:   01/07/23 1002  BP: 106/64  Pulse: (!) 50  Resp: 16  Temp: 98.4 F (36.9 C)  SpO2: 99%        Body mass index is 20.5 kg/m.    ECOG FS:0 - Asymptomatic  General: Well-developed, well-nourished, no acute distress. Eyes: Pink conjunctiva, anicteric sclera. HEENT: Normocephalic, moist mucous membranes. Lungs: No audible wheezing or coughing. Heart: Regular rate and rhythm. Abdomen: Soft, nontender, no obvious distention. Musculoskeletal: No edema, cyanosis, or clubbing. Neuro: Alert, answering all questions appropriately. Cranial nerves grossly intact. Skin: No rashes or petechiae noted. Psych: Normal affect.  LAB RESULTS:  Lab Results  Component Value Date   NA 136 01/07/2023   K 3.9 01/07/2023   CL 102 01/07/2023   CO2 26 01/07/2023   GLUCOSE 140 (H) 01/07/2023   BUN 10 01/07/2023   CREATININE 0.71 01/07/2023   CALCIUM 8.8 (L) 01/07/2023   PROT 7.0 01/07/2023   ALBUMIN 4.1 01/07/2023   AST 17 01/07/2023   ALT 13 01/07/2023   ALKPHOS 63 01/07/2023   BILITOT 0.5 01/07/2023   GFRNONAA >60  01/07/2023   GFRAA >60 04/13/2018    Lab Results  Component Value Date   WBC 8.6 01/07/2023   NEUTROABS 4.5 01/07/2023   HGB 12.1 (L) 01/07/2023   HCT 35.4 (L) 01/07/2023   MCV 95.7 01/07/2023   PLT 463 (H) 01/07/2023     STUDIES: IR CV Line Injection  Result Date: 12/30/2022 CLINICAL DATA:  Pancreatic carcinoma and status post port placement on 09/29/2022. Burning pain over port catheter during infusion. EXAM: CONTRAST INJECTION OF PORT A CATH UNDER FLUOROSCOPY CONTRAST:  12 mL Omnipaque 300 FLUOROSCOPY: 18 seconds.  10.0 mGy. PROCEDURE: Contrast was administered via the indwelling port after it was accessed. Fluoroscopic spot images were obtained of the catheter during injection. FINDINGS: The Port-A-Cath is well positioned and stable since placement with intact and smooth course via right jugular access with the catheter tip at the SVC/RA junction. Injection demonstrates no evidence of contrast extravasation from the port reservoir or attached tubing. Contrast flows freely from the tip of the catheter into the SVC and right atrium with no obstruction or visualized fibrin sheath material. IMPRESSION: Normal Port-A-Cath injection demonstrating intact port with no evidence of contrast extravasation, catheter disruption or malpositioning. The catheter tip lies at the SVC/RA junction. No evidence of catheter occlusion or fibrin sheath. Electronically Signed   By: Irish Lack M.D.   On: 12/30/2022 11:19    ASSESSMENT: Pathologic stage IIa adenocarcinoma the pancreas.  PLAN:    Pathologic stage IIa adenocarcinoma the pancreas: Patient underwent surgical resection on August 18, 2022 confirming stage of disease.  Previously, PET scan did not reveal any metastatic disease.  Preoperatively patient's CA 19-9 was 130, currently within normal limits at 34.  Recommendation was to pursue adjuvant chemotherapy using gemcitabine and capecitabine.  Patient will receive weekly gemcitabine on days 1, 8, and  15 with oral capecitabine days 1 through 21.  Patient will have a 7-day break.  Initial plan was to do 4-6 cycles, but given the cumulative effects of treatment patient will complete cycle 4 and then discontinue treatment.  Proceed with cycle 4, day 1 of treatment today.  Return to clinic in 1 week for further evaluation and consideration of cycle 4, day 8. Pain: Patient does not complain of this today.  Patient has been on chronic narcotics for greater than 20 years.  Currently on MS Contin with  as needed oxycodone.   Hypokalemia: Resolved.  Continue oral potassium supplementation.   Hyperglycemia: Chronic and unchanged. Thrombocytopenia: Resolved.  Patient now has thrombocytosis. Nausea: Continue oral antiemetics as prescribed. Port tenderness: Improved.  No obvious etiology.  Dye study was reported as normal.   Patient expressed understanding and was in agreement with this plan. He also understands that He can call clinic at any time with any questions, concerns, or complaints.    Cancer Staging  Pancreatic adenocarcinoma [facility]) Staging form: Exocrine Pancreas, AJCC 8th Edition - Clinical stage from 07/16/2022: Stage IIA (cT3, cN0, cM0) - Signed by Jeralyn Ruths, MD on 07/16/2022 Stage prefix: Initial diagnosis Total positive nodes: 0   Jeralyn Ruths, MD   01/08/2023 10:33 AM

## 2023-01-07 NOTE — Progress Notes (Signed)
Patient has medication to complete his fourth and final cycle of treatment. Disenrolling patient.

## 2023-01-07 NOTE — Progress Notes (Signed)
[facility] CSW Progress Note  Visual merchandiser met with patient to assess needs while he was receiving his infusion.  His wife was also present.  Patient reported no issues and was happy to hear from his MD that he was almost finished with his treatments.  Encouraged patient to obtain a bag of food from the pantry when he was done with his visit today.    Dorothey Baseman, LCSW Clinical Social Worker [facility]

## 2023-01-07 NOTE — Patient Instructions (Signed)
[facility]  Discharge Instructions: Thank you for choosing [facility] to provide your oncology and hematology care.  If you have a lab appointment with the Cancer Center, please go directly to the Cancer Center and check in at the registration area.  Wear comfortable clothing and clothing appropriate for easy access to any Portacath or PICC line.   We strive to give you quality time with your provider. You may need to reschedule your appointment if you arrive late (15 or more minutes).  Arriving late affects you and other patients whose appointments are after yours.  Also, if you miss three or more appointments without notifying the office, you may be dismissed from the clinic at the provider's discretion.      For prescription refill requests, have your pharmacy contact our office and allow 72 hours for refills to be completed.    Today you received the following chemotherapy and/or immunotherapy agents Gemzar       To help prevent nausea and vomiting after your treatment, we encourage you to take your nausea medication as directed.  BELOW ARE SYMPTOMS THAT SHOULD BE REPORTED IMMEDIATELY: *FEVER GREATER THAN 100.4 F (38 C) OR HIGHER *CHILLS OR SWEATING *NAUSEA AND VOMITING THAT IS NOT CONTROLLED WITH YOUR NAUSEA MEDICATION *UNUSUAL SHORTNESS OF BREATH *UNUSUAL BRUISING OR BLEEDING *URINARY PROBLEMS (pain or burning when urinating, or frequent urination) *BOWEL PROBLEMS (unusual diarrhea, constipation, pain near the anus) TENDERNESS IN MOUTH AND THROAT WITH OR WITHOUT PRESENCE OF ULCERS (sore throat, sores in mouth, or a toothache) UNUSUAL RASH, SWELLING OR PAIN  UNUSUAL VAGINAL DISCHARGE OR ITCHING   Items with * indicate a potential emergency and should be followed up as soon as possible or go to the Emergency Department if any problems should occur.  Please show the CHEMOTHERAPY ALERT CARD or IMMUNOTHERAPY ALERT CARD at check-in to  the Emergency Department and triage nurse.  Should you have questions after your visit or need to cancel or reschedule your appointment, please contact [facility]  [phone] and follow the prompts.  Office hours are 8:00 a.m. to 4:30 p.m. Monday - Friday. Please note that voicemails left after 4:00 p.m. may not be returned until the following business day.  We are closed weekends and major holidays. You have access to a nurse at all times for urgent questions. Please call the main number to the clinic [phone] and follow the prompts.  For any non-urgent questions, you may also contact your provider using MyChart. We now offer e-Visits for anyone 18 and older to request care online for non-urgent symptoms. For details visit [email or web address].   Also download the MyChart app! Go to the app store, search "MyChart", open the app, select [facility], and log in with your MyChart username and password.    

## 2023-01-08 ENCOUNTER — Encounter: Payer: Self-pay | Admitting: Oncology

## 2023-01-11 ENCOUNTER — Encounter: Payer: Self-pay | Admitting: Oncology

## 2023-01-14 ENCOUNTER — Inpatient Hospital Stay (HOSPITAL_BASED_OUTPATIENT_CLINIC_OR_DEPARTMENT_OTHER): Payer: Medicare HMO | Admitting: Oncology

## 2023-01-14 ENCOUNTER — Inpatient Hospital Stay: Payer: Medicare HMO

## 2023-01-14 ENCOUNTER — Other Ambulatory Visit: Payer: Self-pay

## 2023-01-14 ENCOUNTER — Encounter: Payer: Self-pay | Admitting: Oncology

## 2023-01-14 ENCOUNTER — Other Ambulatory Visit: Payer: Self-pay | Admitting: Hospice and Palliative Medicine

## 2023-01-14 VITALS — BP 117/65 | HR 60 | Temp 97.1°F | Resp 16 | Ht 68.0 in | Wt 135.0 lb

## 2023-01-14 VITALS — BP 119/62 | HR 53 | Temp 97.0°F | Resp 17

## 2023-01-14 DIAGNOSIS — C252 Malignant neoplasm of tail of pancreas: Secondary | ICD-10-CM | POA: Diagnosis not present

## 2023-01-14 DIAGNOSIS — C259 Malignant neoplasm of pancreas, unspecified: Secondary | ICD-10-CM | POA: Diagnosis not present

## 2023-01-14 DIAGNOSIS — Z5111 Encounter for antineoplastic chemotherapy: Secondary | ICD-10-CM | POA: Diagnosis not present

## 2023-01-14 DIAGNOSIS — Z79899 Other long term (current) drug therapy: Secondary | ICD-10-CM | POA: Diagnosis not present

## 2023-01-14 DIAGNOSIS — Z23 Encounter for immunization: Secondary | ICD-10-CM | POA: Diagnosis not present

## 2023-01-14 LAB — CBC WITH DIFFERENTIAL (CANCER CENTER ONLY)
Abs Immature Granulocytes: 0.03 10*3/uL (ref 0.00–0.07)
Basophils Absolute: 0.1 10*3/uL (ref 0.0–0.1)
Basophils Relative: 1 %
Eosinophils Absolute: 0.1 10*3/uL (ref 0.0–0.5)
Eosinophils Relative: 2 %
HCT: 34.2 % — ABNORMAL LOW (ref 39.0–52.0)
Hemoglobin: 11.6 g/dL — ABNORMAL LOW (ref 13.0–17.0)
Immature Granulocytes: 1 %
Lymphocytes Relative: 59 %
Lymphs Abs: 3.7 10*3/uL (ref 0.7–4.0)
MCH: 32.8 pg (ref 26.0–34.0)
MCHC: 33.9 g/dL (ref 30.0–36.0)
MCV: 96.6 fL (ref 80.0–100.0)
Monocytes Absolute: 0.6 10*3/uL (ref 0.1–1.0)
Monocytes Relative: 10 %
Neutro Abs: 1.7 10*3/uL (ref 1.7–7.7)
Neutrophils Relative %: 27 %
Platelet Count: 471 10*3/uL — ABNORMAL HIGH (ref 150–400)
RBC: 3.54 MIL/uL — ABNORMAL LOW (ref 4.22–5.81)
RDW: 20.5 % — ABNORMAL HIGH (ref 11.5–15.5)
WBC Count: 6.2 10*3/uL (ref 4.0–10.5)
nRBC: 1.3 % — ABNORMAL HIGH (ref 0.0–0.2)

## 2023-01-14 LAB — CMP (CANCER CENTER ONLY)
ALT: 17 U/L (ref 0–44)
AST: 19 U/L (ref 15–41)
Albumin: 4.2 g/dL (ref 3.5–5.0)
Alkaline Phosphatase: 56 U/L (ref 38–126)
Anion gap: 9 (ref 5–15)
BUN: 13 mg/dL (ref 8–23)
CO2: 27 mmol/L (ref 22–32)
Calcium: 8.8 mg/dL — ABNORMAL LOW (ref 8.9–10.3)
Chloride: 101 mmol/L (ref 98–111)
Creatinine: 0.79 mg/dL (ref 0.61–1.24)
GFR, Estimated: >60 mL/min (ref >60)
Glucose, Bld: 122 mg/dL — ABNORMAL HIGH (ref 70–99)
Potassium: 3.8 mmol/L (ref 3.5–5.1)
Sodium: 137 mmol/L (ref 135–145)
Total Bilirubin: 0.5 mg/dL (ref <1.2)
Total Protein: 7.1 g/dL (ref 6.5–8.1)

## 2023-01-14 MED ORDER — OMEPRAZOLE MAGNESIUM 20 MG PO TBEC: 20.0000 mg | DELAYED_RELEASE_TABLET | Freq: Every day | ORAL | 1 refills | Status: DC

## 2023-01-14 MED ORDER — PROCHLORPERAZINE MALEATE 10 MG PO TABS
10.0000 mg | ORAL_TABLET | Freq: Once | ORAL | Status: AC
  Administered 2023-01-14: 10 mg via ORAL
  Filled 2023-01-14: qty 1

## 2023-01-14 MED ORDER — HEPARIN SOD (PORK) LOCK FLUSH 100 UNIT/ML IV SOLN
500.0000 [IU] | Freq: Once | INTRAVENOUS | Status: AC | PRN
  Administered 2023-01-14: 500 [IU]
  Filled 2023-01-14: qty 5

## 2023-01-14 MED ORDER — SODIUM CHLORIDE 0.9 % IV SOLN
1000.0000 mg/m2 | Freq: Once | INTRAVENOUS | Status: AC
  Administered 2023-01-14: 1786 mg via INTRAVENOUS
  Filled 2023-01-14: qty 46.97

## 2023-01-14 MED ORDER — MORPHINE SULFATE ER 30 MG PO TBCR: 30.0000 mg | EXTENDED_RELEASE_TABLET | Freq: Three times a day (TID) | ORAL | 0 refills | Status: DC

## 2023-01-14 MED ORDER — SODIUM CHLORIDE 0.9 % IV SOLN
Freq: Once | INTRAVENOUS | Status: AC
  Filled 2023-01-14: qty 250

## 2023-01-14 NOTE — Patient Instructions (Signed)
[facility] - A DEPT OF [facility]  Discharge Instructions: Thank you for choosing [facility] to provide your oncology and hematology care.  If you have a lab appointment with the Cancer Center, please go directly to the Cancer Center and check in at the registration area.  Wear comfortable clothing and clothing appropriate for easy access to any Portacath or PICC line.   We strive to give you quality time with your provider. You may need to reschedule your appointment if you arrive late (15 or more minutes).  Arriving late affects you and other patients whose appointments are after yours.  Also, if you miss three or more appointments without notifying the office, you may be dismissed from the clinic at the provider's discretion.      For prescription refill requests, have your pharmacy contact our office and allow 72 hours for refills to be completed.    Today you received the following chemotherapy and/or immunotherapy agents Gemzar      To help prevent nausea and vomiting after your treatment, we encourage you to take your nausea medication as directed.  BELOW ARE SYMPTOMS THAT SHOULD BE REPORTED IMMEDIATELY: *FEVER GREATER THAN 100.4 F (38 C) OR HIGHER *CHILLS OR SWEATING *NAUSEA AND VOMITING THAT IS NOT CONTROLLED WITH YOUR NAUSEA MEDICATION *UNUSUAL SHORTNESS OF BREATH *UNUSUAL BRUISING OR BLEEDING *URINARY PROBLEMS (pain or burning when urinating, or frequent urination) *BOWEL PROBLEMS (unusual diarrhea, constipation, pain near the anus) TENDERNESS IN MOUTH AND THROAT WITH OR WITHOUT PRESENCE OF ULCERS (sore throat, sores in mouth, or a toothache) UNUSUAL RASH, SWELLING OR PAIN  UNUSUAL VAGINAL DISCHARGE OR ITCHING   Items with * indicate a potential emergency and should be followed up as soon as possible or go to the Emergency Department if any problems should occur.  Please show the CHEMOTHERAPY ALERT CARD or IMMUNOTHERAPY ALERT  CARD at check-in to the Emergency Department and triage nurse.  Should you have questions after your visit or need to cancel or reschedule your appointment, please contact [facility] - A DEPT OF [facility]  [phone] and follow the prompts.  Office hours are 8:00 a.m. to 4:30 p.m. Monday - Friday. Please note that voicemails left after 4:00 p.m. may not be returned until the following business day.  We are closed weekends and major holidays. You have access to a nurse at all times for urgent questions. Please call the main number to the clinic [phone] and follow the prompts.  For any non-urgent questions, you may also contact your provider using MyChart. We now offer e-Visits for anyone 74 and older to request care online for non-urgent symptoms. For details visit [email or web address].   Also download the MyChart app! Go to the app store, search "MyChart", open the app, select [facility], and log in with your MyChart username and password.

## 2023-01-14 NOTE — Progress Notes (Signed)
Nutrition Follow-up:  Patient with pancreatic cancer.  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  Patient receiving gemcitabine and xeloda.    Met with patient and wife. Reports that he has found Boost shakes at [facility] at a cheaper cost.  Has been drinking 3 a day and eating solid food.  Yesterday ate scrambled egg and sausage for breakfast.  Lunch was ham sandwich and dinner was pizza.  Reports that he is hoping he has just one more treatment.     Medications: reviewed  Labs: reviewed  Anthropometrics:   Weight 135 lb on 11/8  129 lb 12.8 oz on 10/18 135 lb on 8/30 139 lb on 8/30 145 lb on 8/2 144 lb on 7/11 143 lb on 5/30   NUTRITION DIAGNOSIS: Inadequate oral intake improving    INTERVENTION:  Continue oral nutrition supplements TID Continue high calorie, high protein foods to increase weight    MONITORING, EVALUATION, GOAL: weight trends, intake   NEXT VISIT: Friday, Dec 13 after MD   Levaughn Puccinelli B. Freida Busman, RD, LDN Registered Dietitian [phone]

## 2023-01-14 NOTE — Progress Notes (Signed)
[facility]  Telephone:([phone]) [phone] Fax:(336) [phone]  ID: David Harrell OB: 06/16/1960  MR#: [id number]  NGE#:[id number]  Patient Care Team: Gracelyn Nurse, MD as PCP - General (Internal Medicine) Benita Gutter, RN as Oncology Nurse Navigator Orlie Dakin, Tollie Pizza, MD as Consulting Physician (Oncology)   CHIEF COMPLAINT: Pathologic stage IIa adenocarcinoma the pancreas.  INTERVAL HISTORY: Patient returns to clinic today for further evaluation and consideration of cycle 4, day 8 of gemcitabine and Xeloda.  He continues to feel well and remains asymptomatic.  He does not complain of any weakness or fatigue today.  He denies any recent fevers or illnesses.  He has no neurologic complaints.  He has no chest pain, shortness of breath, cough, or hemoptysis.  He denies any nausea, vomiting, constipation, or diarrhea.  He has no abdominal pain.  He has no urinary complaints.  Patient offers no specific complaints today.  REVIEW OF SYSTEMS:   Review of Systems  Constitutional: Negative.  Negative for fever, malaise/fatigue and weight loss.  Respiratory: Negative.  Negative for cough, hemoptysis and shortness of breath.   Cardiovascular: Negative.  Negative for chest pain and leg swelling.  Gastrointestinal: Negative.  Negative for abdominal pain, diarrhea, nausea and vomiting.  Genitourinary: Negative.  Negative for dysuria.  Musculoskeletal: Negative.  Negative for back pain.  Skin: Negative.  Negative for rash.  Neurological: Negative.  Negative for dizziness, focal weakness, weakness and headaches.  Psychiatric/Behavioral: Negative.  The patient is not nervous/anxious.     As per HPI. Otherwise, a complete review of systems is negative.  PAST MEDICAL HISTORY: Past Medical History:  Diagnosis Date   Arthritis    GERD (gastroesophageal reflux disease)    Hiatal hernia    History of kidney stones    Hypertension    Pancreatic cancer (HCC) 2024    PAST  SURGICAL HISTORY: Past Surgical History:  Procedure Laterality Date   ANTERIOR CERVICAL DECOMP/DISCECTOMY FUSION     BACK SURGERY     x5   ESOPHAGOGASTRODUODENOSCOPY N/A 07/12/2022   Procedure: ESOPHAGOGASTRODUODENOSCOPY (EGD);  Surgeon: Lemar Lofty., MD;  Location: [facility];  Service: Gastroenterology;  Laterality: N/A;   ESOPHAGOGASTRODUODENOSCOPY (EGD) WITH PROPOFOL N/A 07/05/2022   Procedure: ESOPHAGOGASTRODUODENOSCOPY (EGD) WITH PROPOFOL;  Surgeon: [name], DO;  Location: [facility] ENDOSCOPY;  Service: Gastroenterology;  Laterality: N/A;   EUS N/A 07/12/2022   Procedure: UPPER ENDOSCOPIC ULTRASOUND (EUS) RADIAL;  Surgeon: Lemar Lofty., MD;  Location: [facility] ENDOSCOPY;  Service: Gastroenterology;  Laterality: N/A;   FINE NEEDLE ASPIRATION  07/12/2022   Procedure: FINE NEEDLE ASPIRATION;  Surgeon: Lemar Lofty., MD;  Location: [facility] ENDOSCOPY;  Service: Gastroenterology;;   IR CV LINE INJECTION  12/30/2022   IR IMAGING GUIDED PORT INSERTION  09/29/2022   LAPAROSCOPY N/A 08/18/2022   Procedure: STAGING LAPAROSCOPY;  Surgeon: Fritzi Mandes, MD;  Location: [facility] OR;  Service: General;  Laterality: N/A;   OPERATIVE ULTRASOUND N/A 08/18/2022   Procedure: INTRAOPERATIVE ULTRASOUND;  Surgeon: Fritzi Mandes, MD;  Location: [facility] OR;  Service: General;  Laterality: N/A;   SHOULDER ARTHROSCOPY Bilateral    possibly metal in right shoulder, pt unsure   SPLENECTOMY, TOTAL N/A 08/18/2022   Procedure: SPLENECTOMY;  Surgeon: Fritzi Mandes, MD;  Location: [facility] OR;  Service: General;  Laterality: N/A;    FAMILY HISTORY: Family History  Problem Relation Age of Onset   Liver cancer Sister    Spina bifida Paternal Grandfather     ADVANCED DIRECTIVES (Y/N):  N  HEALTH MAINTENANCE: Social History   Tobacco Use   Smoking status: Every Day    Current packs/day: 0.50    Average packs/day: 0.5 packs/day for 40.0 years (20.0 ttl pk-yrs)    Types: Cigarettes    Smokeless tobacco: Never  Vaping Use   Vaping status: Never Used  Substance Use Topics   Alcohol use: Not Currently   Drug use: Yes    Frequency: 1.0 times per week    Types: Marijuana     Colonoscopy:  PAP:  Bone density:  Lipid panel:  Allergies  Allergen Reactions   Aspirin     Acid reflux     Pregabalin     Dizziness   Tape     Blisters skin, paper tape is ok   Codeine Palpitations    headaches   Oxymorphone Rash   Penicillins Hives and Rash   Vicodin [Hydrocodone-Acetaminophen] Palpitations    Headaches     Current Outpatient Medications  Medication Sig Dispense Refill   ALPRAZolam (XANAX) 1 MG tablet Take 1 mg by mouth 2 (two) times daily.     budesonide-formoterol (SYMBICORT) 160-4.5 MCG/ACT inhaler Inhale 2 puffs into the lungs 2 (two) times daily as needed (shortness of breath).     capecitabine (XELODA) 500 MG tablet Take 3 tablets (1,500 mg total) by mouth 2 (two) times daily after a meal. Take for 21 days, then hold for 7 days. Repeat every 28 days. 126 tablet 1   lidocaine-prilocaine (EMLA) cream Apply to affected area once 30 g 3   lisinopril (ZESTRIL) 20 MG tablet Take 20 mg by mouth daily.     meloxicam (MOBIC) 15 MG tablet Take 15 mg by mouth daily.     methocarbamol (ROBAXIN) 500 MG tablet Take 2 tablets (1,000 mg total) by mouth every 6 (six) hours as needed. 60 tablet 0   omeprazole (PRILOSEC OTC) 20 MG tablet Take 1 tablet (20 mg total) by mouth daily. 90 tablet 1   ondansetron (ZOFRAN) 8 MG tablet Take 1 tablet (8 mg total) by mouth every 8 (eight) hours as needed for nausea or vomiting. 60 tablet 1   Oxycodone HCl 10 MG TABS Take 1 tablet (10 mg total) by mouth every 4 (four) hours as needed (breakthrough pain). 20 tablet 0   pantoprazole (PROTONIX) 40 MG tablet Take 1 tablet (40 mg total) by mouth daily. 60 tablet 0   prochlorperazine (COMPAZINE) 10 MG tablet Take 1 tablet (10 mg total) by mouth every 6 (six) hours as needed for nausea or vomiting.  60 tablet 2   rosuvastatin (CRESTOR) 20 MG tablet Take 20 mg by mouth daily.     acetaminophen (TYLENOL) 500 MG tablet Take 2 tablets (1,000 mg total) by mouth 3 (three) times daily. (Patient not taking: Reported on 10/15/2022) 30 tablet 0   morphine (MS CONTIN) 30 MG 12 hr tablet Take 1 tablet (30 mg total) by mouth every 8 (eight) hours. 90 tablet 0   polyethylene glycol (MIRALAX / GLYCOLAX) 17 g packet Take 17 g by mouth daily. (Patient not taking: Reported on 11/26/2022) 14 each 0   potassium chloride SA (KLOR-CON M20) 20 MEQ tablet Take 1 tablet (20 mEq total) by mouth daily. (Patient not taking: Reported on 01/07/2023) 60 tablet 1   senna (SENOKOT) 8.6 MG TABS tablet Take 2 tablets (17.2 mg total) by mouth at bedtime. (Patient not taking: Reported on 11/26/2022) 120 tablet 0   No current facility-administered medications for this visit.  Facility-Administered Medications Ordered in Other Visits  Medication Dose Route Frequency Provider Last Rate Last Admin   gemcitabine (GEMZAR) 1,786 mg in sodium chloride 0.9 % 250 mL chemo infusion  1,000 mg/m2 (Treatment Plan Recorded) Intravenous Once Jeralyn Ruths, MD 594 mL/hr at 01/14/23 1141 1,786 mg at 01/14/23 1141   heparin lock flush 100 unit/mL  500 Units Intracatheter Once PRN Jeralyn Ruths, MD        OBJECTIVE: Vitals:   01/14/23 0949  BP: 117/65  Pulse: 60  Resp: 16  Temp: (!) 97.1 F (36.2 C)  SpO2: 99%        Body mass index is 20.53 kg/m.    ECOG FS:0 - Asymptomatic  General: Well-developed, well-nourished, no acute distress. Eyes: Pink conjunctiva, anicteric sclera. HEENT: Normocephalic, moist mucous membranes. Lungs: No audible wheezing or coughing. Heart: Regular rate and rhythm. Abdomen: Soft, nontender, no obvious distention. Musculoskeletal: No edema, cyanosis, or clubbing. Neuro: Alert, answering all questions appropriately. Cranial nerves grossly intact. Skin: No rashes or petechiae noted. Psych: Normal  affect.  LAB RESULTS:  Lab Results  Component Value Date   NA 137 01/14/2023   K 3.8 01/14/2023   CL 101 01/14/2023   CO2 27 01/14/2023   GLUCOSE 122 (H) 01/14/2023   BUN 13 01/14/2023   CREATININE 0.79 01/14/2023   CALCIUM 8.8 (L) 01/14/2023   PROT 7.1 01/14/2023   ALBUMIN 4.2 01/14/2023   AST 19 01/14/2023   ALT 17 01/14/2023   ALKPHOS 56 01/14/2023   BILITOT 0.5 01/14/2023   GFRNONAA >60 01/14/2023   GFRAA >60 04/13/2018    Lab Results  Component Value Date   WBC 6.2 01/14/2023   NEUTROABS 1.7 01/14/2023   HGB 11.6 (L) 01/14/2023   HCT 34.2 (L) 01/14/2023   MCV 96.6 01/14/2023   PLT 471 (H) 01/14/2023     STUDIES: IR CV Line Injection  Result Date: 12/30/2022 CLINICAL DATA:  Pancreatic carcinoma and status post port placement on 09/29/2022. Burning pain over port catheter during infusion. EXAM: CONTRAST INJECTION OF PORT A CATH UNDER FLUOROSCOPY CONTRAST:  12 mL Omnipaque 300 FLUOROSCOPY: 18 seconds.  10.0 mGy. PROCEDURE: Contrast was administered via the indwelling port after it was accessed. Fluoroscopic spot images were obtained of the catheter during injection. FINDINGS: The Port-A-Cath is well positioned and stable since placement with intact and smooth course via right jugular access with the catheter tip at the SVC/RA junction. Injection demonstrates no evidence of contrast extravasation from the port reservoir or attached tubing. Contrast flows freely from the tip of the catheter into the SVC and right atrium with no obstruction or visualized fibrin sheath material. IMPRESSION: Normal Port-A-Cath injection demonstrating intact port with no evidence of contrast extravasation, catheter disruption or malpositioning. The catheter tip lies at the SVC/RA junction. No evidence of catheter occlusion or fibrin sheath. Electronically Signed   By: Irish Lack M.D.   On: 12/30/2022 11:19    ASSESSMENT: Pathologic stage IIa adenocarcinoma the pancreas.  PLAN:     Pathologic stage IIa adenocarcinoma the pancreas: Patient underwent surgical resection on August 18, 2022 confirming stage of disease.  Previously, PET scan did not reveal any metastatic disease.  Preoperatively patient's CA 19-9 was 130, currently within normal limits at 34.  Recommendation was to pursue adjuvant chemotherapy using gemcitabine and capecitabine.  Patient will receive weekly gemcitabine on days 1, 8, and 15 with oral capecitabine days 1 through 21.  Patient will have a 7-day break.  Initial plan was  to do 4-6 cycles, but given the cumulative effects of treatment patient will complete cycle 4 and then discontinue treatment.  Proceed with cycle 4, day 8 of treatment today.  Return to clinic in 1 week for further evaluation and consideration of cycle 4, day 15. Pain: Patient does not complain of this today.  Patient has been on chronic narcotics for greater than 20 years.  Currently on MS Contin with as needed oxycodone.   Hyperglycemia: Chronic and unchanged. Thrombocytosis: Mild, monitor. Anemia: Mild, monitor.  Patient's hemoglobin is 11.6. Nausea: Patient does not complain of this today.  Continue oral antiemetics as prescribed. Port tenderness: Improved.  No obvious etiology.  Dye study was reported as normal.   Patient expressed understanding and was in agreement with this plan. He also understands that He can call clinic at any time with any questions, concerns, or complaints.    Cancer Staging  Pancreatic adenocarcinoma [facility]) Staging form: Exocrine Pancreas, AJCC 8th Edition - Clinical stage from 07/16/2022: Stage IIA (cT3, cN0, cM0) - Signed by Jeralyn Ruths, MD on 07/16/2022 Stage prefix: Initial diagnosis Total positive nodes: 0   Jeralyn Ruths, MD   01/14/2023 11:57 AM

## 2023-01-14 NOTE — Progress Notes (Signed)
MS Contin refilled per Dr. Milinda Cave request.

## 2023-01-17 NOTE — Addendum Note (Signed)
Addended by: Amaryllis Dyke on: 01/17/2023 11:34 AM   Modules accepted: Orders

## 2023-01-21 ENCOUNTER — Inpatient Hospital Stay: Payer: Medicare HMO | Admitting: Oncology

## 2023-01-21 ENCOUNTER — Inpatient Hospital Stay: Payer: Medicare HMO

## 2023-01-24 ENCOUNTER — Telehealth: Payer: Self-pay | Admitting: Oncology

## 2023-01-24 ENCOUNTER — Other Ambulatory Visit: Payer: Self-pay | Admitting: Oncology

## 2023-01-24 DIAGNOSIS — C259 Malignant neoplasm of pancreas, unspecified: Secondary | ICD-10-CM

## 2023-01-24 NOTE — Telephone Encounter (Signed)
Fourth attempt at calling pt about missed appts on Friday 11/15. No answer, I left a VM to return our call.

## 2023-01-26 ENCOUNTER — Encounter: Payer: Self-pay | Admitting: Oncology

## 2023-01-28 ENCOUNTER — Other Ambulatory Visit: Payer: Self-pay | Admitting: Oncology

## 2023-01-28 ENCOUNTER — Other Ambulatory Visit: Payer: Self-pay | Admitting: Hospice and Palliative Medicine

## 2023-01-28 ENCOUNTER — Inpatient Hospital Stay (HOSPITAL_BASED_OUTPATIENT_CLINIC_OR_DEPARTMENT_OTHER): Payer: Medicare HMO | Admitting: Oncology

## 2023-01-28 ENCOUNTER — Encounter: Payer: Self-pay | Admitting: Oncology

## 2023-01-28 ENCOUNTER — Inpatient Hospital Stay: Payer: Medicare HMO

## 2023-01-28 VITALS — BP 100/64 | HR 68 | Temp 96.6°F | Resp 16 | Ht 68.0 in | Wt 136.7 lb

## 2023-01-28 VITALS — BP 115/72 | HR 59 | Temp 96.0°F | Resp 17

## 2023-01-28 DIAGNOSIS — C259 Malignant neoplasm of pancreas, unspecified: Secondary | ICD-10-CM

## 2023-01-28 DIAGNOSIS — C252 Malignant neoplasm of tail of pancreas: Secondary | ICD-10-CM | POA: Diagnosis not present

## 2023-01-28 DIAGNOSIS — Z79899 Other long term (current) drug therapy: Secondary | ICD-10-CM | POA: Diagnosis not present

## 2023-01-28 DIAGNOSIS — Z23 Encounter for immunization: Secondary | ICD-10-CM | POA: Diagnosis not present

## 2023-01-28 DIAGNOSIS — Z5111 Encounter for antineoplastic chemotherapy: Secondary | ICD-10-CM | POA: Diagnosis not present

## 2023-01-28 LAB — CBC WITH DIFFERENTIAL (CANCER CENTER ONLY)
Abs Immature Granulocytes: 0.04 10*3/uL (ref 0.00–0.07)
Basophils Absolute: 0.1 10*3/uL (ref 0.0–0.1)
Basophils Relative: 1 %
Eosinophils Absolute: 0.7 10*3/uL — ABNORMAL HIGH (ref 0.0–0.5)
Eosinophils Relative: 5 %
HCT: 33.9 % — ABNORMAL LOW (ref 39.0–52.0)
Hemoglobin: 11.8 g/dL — ABNORMAL LOW (ref 13.0–17.0)
Immature Granulocytes: 0 %
Lymphocytes Relative: 37 %
Lymphs Abs: 4.8 10*3/uL — ABNORMAL HIGH (ref 0.7–4.0)
MCH: 34.1 pg — ABNORMAL HIGH (ref 26.0–34.0)
MCHC: 34.8 g/dL (ref 30.0–36.0)
MCV: 98 fL (ref 80.0–100.0)
Monocytes Absolute: 1.6 10*3/uL — ABNORMAL HIGH (ref 0.1–1.0)
Monocytes Relative: 13 %
Neutro Abs: 5.8 10*3/uL (ref 1.7–7.7)
Neutrophils Relative %: 44 %
Platelet Count: 348 10*3/uL (ref 150–400)
RBC: 3.46 MIL/uL — ABNORMAL LOW (ref 4.22–5.81)
RDW: 21.2 % — ABNORMAL HIGH (ref 11.5–15.5)
WBC Count: 13 10*3/uL — ABNORMAL HIGH (ref 4.0–10.5)
nRBC: 0.5 % — ABNORMAL HIGH (ref 0.0–0.2)

## 2023-01-28 LAB — CMP (CANCER CENTER ONLY)
ALT: 11 U/L (ref 0–44)
AST: 18 U/L (ref 15–41)
Albumin: 4.2 g/dL (ref 3.5–5.0)
Alkaline Phosphatase: 55 U/L (ref 38–126)
Anion gap: 11 (ref 5–15)
BUN: 14 mg/dL (ref 8–23)
CO2: 26 mmol/L (ref 22–32)
Calcium: 8.7 mg/dL — ABNORMAL LOW (ref 8.9–10.3)
Chloride: 102 mmol/L (ref 98–111)
Creatinine: 0.77 mg/dL (ref 0.61–1.24)
GFR, Estimated: >60 mL/min (ref >60)
Glucose, Bld: 99 mg/dL (ref 70–99)
Potassium: 3.9 mmol/L (ref 3.5–5.1)
Sodium: 139 mmol/L (ref 135–145)
Total Bilirubin: 0.5 mg/dL (ref <1.2)
Total Protein: 6.9 g/dL (ref 6.5–8.1)

## 2023-01-28 MED ORDER — MENINGOCOCCAL VAC B (OMV) IM SUSY
0.5000 mL | PREFILLED_SYRINGE | Freq: Once | INTRAMUSCULAR | Status: AC
Start: 2023-01-28 — End: 2023-01-28
  Administered 2023-01-28: 0.5 mL via INTRAMUSCULAR
  Filled 2023-01-28: qty 0.5

## 2023-01-28 MED ORDER — ALUM & MAG HYDROXIDE-SIMETH 200-200-20 MG/5ML PO SUSP
15.0000 mL | Freq: Once | ORAL | Status: AC
  Administered 2023-01-28: 15 mL via ORAL
  Filled 2023-01-28: qty 30

## 2023-01-28 MED ORDER — PROCHLORPERAZINE MALEATE 10 MG PO TABS
10.0000 mg | ORAL_TABLET | Freq: Once | ORAL | Status: AC
  Administered 2023-01-28: 10 mg via ORAL
  Filled 2023-01-28: qty 1

## 2023-01-28 MED ORDER — HEPARIN SOD (PORK) LOCK FLUSH 100 UNIT/ML IV SOLN
500.0000 [IU] | Freq: Once | INTRAVENOUS | Status: AC | PRN
  Administered 2023-01-28: 500 [IU]
  Filled 2023-01-28: qty 5

## 2023-01-28 MED ORDER — MENINGOCOCCAL A C Y&W-135 OLIG IM SOLN
0.5000 mL | Freq: Once | INTRAMUSCULAR | Status: AC
Start: 2023-01-28 — End: 2023-01-28
  Administered 2023-01-28: 0.5 mL via INTRAMUSCULAR
  Filled 2023-01-28: qty 0.5

## 2023-01-28 MED ORDER — SODIUM CHLORIDE 0.9 % IV SOLN
1000.0000 mg/m2 | Freq: Once | INTRAVENOUS | Status: AC
  Administered 2023-01-28: 1786 mg via INTRAVENOUS
  Filled 2023-01-28: qty 46.97

## 2023-01-28 MED ORDER — SODIUM CHLORIDE 0.9 % IV SOLN
Freq: Once | INTRAVENOUS | Status: AC
  Filled 2023-01-28: qty 250

## 2023-01-28 NOTE — Progress Notes (Signed)
Wondering about vaccines

## 2023-01-28 NOTE — Patient Instructions (Signed)
[facility] - A DEPT OF [facility]  Discharge Instructions: Thank you for choosing [facility] to provide your oncology and hematology care.  If you have a lab appointment with the Cancer Center, please go directly to the Cancer Center and check in at the registration area.  Wear comfortable clothing and clothing appropriate for easy access to any Portacath or PICC line.   We strive to give you quality time with your provider. You may need to reschedule your appointment if you arrive late (15 or more minutes).  Arriving late affects you and other patients whose appointments are after yours.  Also, if you miss three or more appointments without notifying the office, you may be dismissed from the clinic at the provider's discretion.      For prescription refill requests, have your pharmacy contact our office and allow 72 hours for refills to be completed.    Today you received the following chemotherapy and/or immunotherapy agents gemzar      To help prevent nausea and vomiting after your treatment, we encourage you to take your nausea medication as directed.  BELOW ARE SYMPTOMS THAT SHOULD BE REPORTED IMMEDIATELY: *FEVER GREATER THAN 100.4 F (38 C) OR HIGHER *CHILLS OR SWEATING *NAUSEA AND VOMITING THAT IS NOT CONTROLLED WITH YOUR NAUSEA MEDICATION *UNUSUAL SHORTNESS OF BREATH *UNUSUAL BRUISING OR BLEEDING *URINARY PROBLEMS (pain or burning when urinating, or frequent urination) *BOWEL PROBLEMS (unusual diarrhea, constipation, pain near the anus) TENDERNESS IN MOUTH AND THROAT WITH OR WITHOUT PRESENCE OF ULCERS (sore throat, sores in mouth, or a toothache) UNUSUAL RASH, SWELLING OR PAIN  UNUSUAL VAGINAL DISCHARGE OR ITCHING   Items with * indicate a potential emergency and should be followed up as soon as possible or go to the Emergency Department if any problems should occur.  Please show the CHEMOTHERAPY ALERT CARD or IMMUNOTHERAPY ALERT  CARD at check-in to the Emergency Department and triage nurse.  Should you have questions after your visit or need to cancel or reschedule your appointment, please contact [facility] - A DEPT OF [facility]  [phone] and follow the prompts.  Office hours are 8:00 a.m. to 4:30 p.m. Monday - Friday. Please note that voicemails left after 4:00 p.m. may not be returned until the following business day.  We are closed weekends and major holidays. You have access to a nurse at all times for urgent questions. Please call the main number to the clinic [phone] and follow the prompts.  For any non-urgent questions, you may also contact your provider using MyChart. We now offer e-Visits for anyone 40 and older to request care online for non-urgent symptoms. For details visit [email or web address].   Also download the MyChart app! Go to the app store, search "MyChart", open the app, select , and log in with your MyChart username and password.

## 2023-01-28 NOTE — Progress Notes (Signed)
[facility]  [phone] [phone]) [phone]  ID: David Harrell OB: 01-Oct-1960  MR#: [id number]  QIO#:[id number]  Patient Care Team: Gracelyn Nurse, MD as PCP - General (Internal Medicine) Benita Gutter, RN as Oncology Nurse Navigator Orlie Dakin, Tollie Pizza, MD as Consulting Physician (Oncology)   CHIEF COMPLAINT: Pathologic stage IIa adenocarcinoma the pancreas.  INTERVAL HISTORY: Patient returns to clinic today for further evaluation and consideration of cycle 8, day 15 of gemcitabine and Xeloda.  He missed his treatment last week secondary to a "family emergency".  Today is his last treatment.  He currently feels well and is asymptomatic.  He continues to feel well and remains asymptomatic.  He does not complain of any weakness or fatigue today.  He denies any recent fevers or illnesses.  He has no neurologic complaints.  He has no chest pain, shortness of breath, cough, or hemoptysis.  He denies any nausea, vomiting, constipation, or diarrhea.  He has no abdominal pain.  He has no urinary complaints.  Patient offers no specific complaints today.  REVIEW OF SYSTEMS:   Review of Systems  Constitutional: Negative.  Negative for fever, malaise/fatigue and weight loss.  Respiratory: Negative.  Negative for cough, hemoptysis and shortness of breath.   Cardiovascular: Negative.  Negative for chest pain and leg swelling.  Gastrointestinal: Negative.  Negative for abdominal pain, diarrhea, nausea and vomiting.  Genitourinary: Negative.  Negative for dysuria.  Musculoskeletal: Negative.  Negative for back pain.  Skin: Negative.  Negative for rash.  Neurological: Negative.  Negative for dizziness, focal weakness, weakness and headaches.  Psychiatric/Behavioral: Negative.  The patient is not nervous/anxious.     As per HPI. Otherwise, a complete review of systems is negative.  PAST MEDICAL HISTORY: Past Medical History:  Diagnosis Date   Arthritis    GERD  (gastroesophageal reflux disease)    Hiatal hernia    History of kidney stones    Hypertension    Pancreatic cancer (HCC) 2024    PAST SURGICAL HISTORY: Past Surgical History:  Procedure Laterality Date   ANTERIOR CERVICAL DECOMP/DISCECTOMY FUSION     BACK SURGERY     x5   ESOPHAGOGASTRODUODENOSCOPY N/A 07/12/2022   Procedure: ESOPHAGOGASTRODUODENOSCOPY (EGD);  Surgeon: Lemar Lofty., MD;  Location: [facility] END[facility]COPY;  Service: Gastroenterology;  Laterality: N/A;   ESOPHAGOGASTRODUODENOSCOPY (EGD) WITH PROPOFOL N/A 07/05/2022   Procedure: ESOPHAGOGASTRODUODENOSCOPY (EGD) WITH PROPOFOL;  Surgeon: [name], DO;  Location[facility] END[facility]COPY;  Service: Gastroenterology;  Laterality: N/A;   EUS N/A 07/12/2022   Procedure: UPPER ENDOSCOPIC ULTRASOUND (EUS) RADIAL;  Surgeon: Lemar Lofty., MD;  Location: [facility] ENDOSCOPY;  Service: Gastroenterology;  Laterality: N/A;   FINE NEEDLE ASPIRATION  07/12/2022   Procedure: FINE NEEDLE ASPIRATION;  Surgeon: Lemar Lofty., MD;  Location: [facility] ENDOSCOPY;  Service: Gastroenterology;;   IR CV LINE INJECTION  12/30/2022   IR IMAGING GUIDED PORT INSERTION  09/29/2022   LAPAROSCOPY N/A 08/18/2022   Procedure: STAGING LAPAROSCOPY;  Surgeon: Fritzi Mandes, MD;  Location: [facility] OR;  Service: General;  Laterality: N/A;   OPERATIVE ULTRASOUND N/A 08/18/2022   Procedure: INTRAOPERATIVE ULTRASOUND;  Surgeon: Fritzi Mandes, MD;  Location: [facility] OR;  Service: General;  Laterality: N/A;   SHOULDER ARTHROSCOPY Bilateral    possibly metal in right shoulder, pt unsure   SPLENECTOMY, TOTAL N/A 08/18/2022   Procedure: SPLENECTOMY;  Surgeon: Fritzi Mandes, MD;  Location: [facility] OR;  Service: General;  Laterality: N/A;    FAMILY HISTORY: Family  History  Problem Relation Age of Onset   Liver cancer Sister    Spina bifida Paternal Grandfather     ADVANCED DIRECTIVES (Y/N):  N  HEALTH MAINTENANCE: Social History   Tobacco Use   Smoking  status: Every Day    Current packs/day: 0.50    Average packs/day: 0.5 packs/day for 40.0 years (20.0 ttl pk-yrs)    Types: Cigarettes   Smokeless tobacco: Never  Vaping Use   Vaping status: Never Used  Substance Use Topics   Alcohol use: Not Currently   Drug use: Yes    Frequency: 1.0 times per week    Types: Marijuana     Colonoscopy:  PAP:  Bone density:  Lipid panel:  Allergies  Allergen Reactions   Aspirin     Acid reflux     Pregabalin     Dizziness   Tape     Blisters skin, paper tape is ok   Codeine Palpitations    headaches   Oxymorphone Rash   Penicillins Hives and Rash   Vicodin [Hydrocodone-Acetaminophen] Palpitations    Headaches     Current Outpatient Medications  Medication Sig Dispense Refill   ALPRAZolam (XANAX) 1 MG tablet Take 1 mg by mouth 2 (two) times daily.     budesonide-formoterol (SYMBICORT) 160-4.5 MCG/ACT inhaler Inhale 2 puffs into the lungs 2 (two) times daily as needed (shortness of breath).     capecitabine (XELODA) 500 MG tablet Take 3 tablets (1,500 mg total) by mouth 2 (two) times daily after a meal. Take for 21 days, then hold for 7 days. Repeat every 28 days. 126 tablet 1   lidocaine-prilocaine (EMLA) cream Apply to affected area once 30 g 3   lisinopril (ZESTRIL) 20 MG tablet Take 20 mg by mouth daily.     meloxicam (MOBIC) 15 MG tablet Take 15 mg by mouth daily.     methocarbamol (ROBAXIN) 500 MG tablet Take 2 tablets (1,000 mg total) by mouth every 6 (six) hours as needed. 60 tablet 0   morphine (MS CONTIN) 30 MG 12 hr tablet Take 1 tablet (30 mg total) by mouth every 8 (eight) hours. 90 tablet 0   omeprazole (PRILOSEC OTC) 20 MG tablet Take 1 tablet (20 mg total) by mouth daily. 90 tablet 1   ondansetron (ZOFRAN) 8 MG tablet Take 1 tablet (8 mg total) by mouth every 8 (eight) hours as needed for nausea or vomiting. 60 tablet 1   Oxycodone HCl 10 MG TABS Take 1 tablet (10 mg total) by mouth every 4 (four) hours as needed  (breakthrough pain). 20 tablet 0   pantoprazole (PROTONIX) 40 MG tablet Take 1 tablet (40 mg total) by mouth daily. 60 tablet 0   polyethylene glycol (MIRALAX / GLYCOLAX) 17 g packet Take 17 g by mouth daily. 14 each 0   potassium chloride SA (KLOR-CON M20) 20 MEQ tablet Take 1 tablet (20 mEq total) by mouth daily. 60 tablet 1   prochlorperazine (COMPAZINE) 10 MG tablet Take 1 tablet (10 mg total) by mouth every 6 (six) hours as needed for nausea or vomiting. 60 tablet 2   rosuvastatin (CRESTOR) 20 MG tablet Take 20 mg by mouth daily.     senna (SENOKOT) 8.6 MG TABS tablet Take 2 tablets (17.2 mg total) by mouth at bedtime. 120 tablet 0   acetaminophen (TYLENOL) 500 MG tablet Take 2 tablets (1,000 mg total) by mouth 3 (three) times daily. (Patient not taking: Reported on 10/15/2022) 30 tablet 0  No current facility-administered medications for this visit.    OBJECTIVE: Vitals:   01/28/23 0838  BP: 100/64  Pulse: 68  Resp: 16  Temp: (!) 96.6 F (35.9 C)  SpO2: 100%       Body mass index is 20.79 kg/m.    ECOG FS:0 - Asymptomatic  General: Well-developed, well-nourished, no acute distress. Eyes: Pink conjunctiva, anicteric sclera. HEENT: Normocephalic, moist mucous membranes. Lungs: No audible wheezing or coughing. Heart: Regular rate and rhythm. Abdomen: Soft, nontender, no obvious distention. Musculoskeletal: No edema, cyanosis, or clubbing. Neuro: Alert, answering all questions appropriately. Cranial nerves grossly intact. Skin: No rashes or petechiae noted. Psych: Normal affect.  LAB RESULTS:  Lab Results  Component Value Date   NA 139 01/28/2023   K 3.9 01/28/2023   CL 102 01/28/2023   CO2 26 01/28/2023   GLUCOSE 99 01/28/2023   BUN 14 01/28/2023   CREATININE 0.77 01/28/2023   CALCIUM 8.7 (L) 01/28/2023   PROT 6.9 01/28/2023   ALBUMIN 4.2 01/28/2023   AST 18 01/28/2023   ALT 11 01/28/2023   ALKPHOS 55 01/28/2023   BILITOT 0.5 01/28/2023   GFRNONAA >60  01/28/2023   GFRAA >60 04/13/2018    Lab Results  Component Value Date   WBC 13.0 (H) 01/28/2023   NEUTROABS 5.8 01/28/2023   HGB 11.8 (L) 01/28/2023   HCT 33.9 (L) 01/28/2023   MCV 98.0 01/28/2023   PLT 348 01/28/2023     STUDIES: IR CV Line Injection  Result Date: 12/30/2022 CLINICAL DATA:  Pancreatic carcinoma and status post port placement on 09/29/2022. Burning pain over port catheter during infusion. EXAM: CONTRAST INJECTION OF PORT A CATH UNDER FLUOROSCOPY CONTRAST:  12 mL Omnipaque 300 FLUOROSCOPY: 18 seconds.  10.0 mGy. PROCEDURE: Contrast was administered via the indwelling port after it was accessed. Fluoroscopic spot images were obtained of the catheter during injection. FINDINGS: The Port-A-Cath is well positioned and stable since placement with intact and smooth course via right jugular access with the catheter tip at the SVC/RA junction. Injection demonstrates no evidence of contrast extravasation from the port reservoir or attached tubing. Contrast flows freely from the tip of the catheter into the SVC and right atrium with no obstruction or visualized fibrin sheath material. IMPRESSION: Normal Port-A-Cath injection demonstrating intact port with no evidence of contrast extravasation, catheter disruption or malpositioning. The catheter tip lies at the SVC/RA junction. No evidence of catheter occlusion or fibrin sheath. Electronically Signed   By: Irish Lack M.D.   On: 12/30/2022 11:19    ASSESSMENT: Pathologic stage IIa adenocarcinoma the pancreas.  PLAN:    Pathologic stage IIa adenocarcinoma the pancreas: Patient underwent surgical resection on August 18, 2022 confirming stage of disease.  Previously, PET scan did not reveal any metastatic disease.  Preoperatively patient's CA 19-9 was 130, currently within normal limits at 34.  Recommendation was to pursue adjuvant chemotherapy using gemcitabine and capecitabine.  Patient will receive weekly gemcitabine on days 1, 8,  and 15 with oral capecitabine days 1 through 21.  Patient will have a 7-day break.  Initial plan was to do 4-6 cycles, but given the cumulative effects of treatment and noncompliance, patient will complete cycle 4 and then discontinue treatment.  Proceed with cycle 4, day 15 of treatment today.  Patient has also been instructed to complete his current prescription of Xeloda and then discontinue.  Return to clinic in 8 to 10 weeks with repeat imaging using PET scan and further evaluation.   Pain:  Patient does not complain of this today.  Patient has been on chronic narcotics for greater than 20 years.  Currently on MS Contin with as needed oxycodone.    Hyperglycemia: Patient's blood glucose is within normal limits today. Thrombocytosis: Resolved. Anemia: Mild.  Patient's hemoglobin is 11.8.   Nausea: Patient does not complain of this today.  Continue oral antiemetics as prescribed. Port tenderness: Improved.  No obvious etiology.  Dye study was reported as normal. Splenectomy: Patient will receive vaccines today.   Patient expressed understanding and was in agreement with this plan. He also understands that He can call clinic at any time with any questions, concerns, or complaints.    Cancer Staging  Pancreatic adenocarcinoma [facility]) Staging form: Exocrine Pancreas, AJCC 8th Edition - Clinical stage from 07/16/2022: Stage IIA (cT3, cN0, cM0) - Signed by Jeralyn Ruths, MD on 07/16/2022 Stage prefix: Initial diagnosis Total positive nodes: 0   Jeralyn Ruths, MD   01/28/2023 8:52 AM

## 2023-02-03 ENCOUNTER — Other Ambulatory Visit: Payer: Self-pay | Admitting: Nurse Practitioner

## 2023-02-09 ENCOUNTER — Encounter: Payer: Self-pay | Admitting: Oncology

## 2023-02-11 ENCOUNTER — Ambulatory Visit: Payer: Medicare HMO

## 2023-02-11 ENCOUNTER — Other Ambulatory Visit: Payer: Medicare HMO

## 2023-02-11 ENCOUNTER — Ambulatory Visit: Payer: Medicare HMO | Admitting: Oncology

## 2023-02-14 ENCOUNTER — Other Ambulatory Visit: Payer: Self-pay | Admitting: *Deleted

## 2023-02-14 DIAGNOSIS — C259 Malignant neoplasm of pancreas, unspecified: Secondary | ICD-10-CM

## 2023-02-14 MED ORDER — MORPHINE SULFATE ER 30 MG PO TBCR: 30.0000 mg | EXTENDED_RELEASE_TABLET | Freq: Three times a day (TID) | ORAL | 0 refills | Status: DC

## 2023-02-16 ENCOUNTER — Inpatient Hospital Stay: Payer: Medicare HMO | Attending: Oncology | Admitting: Hospice and Palliative Medicine

## 2023-02-16 DIAGNOSIS — C259 Malignant neoplasm of pancreas, unspecified: Secondary | ICD-10-CM | POA: Diagnosis not present

## 2023-02-16 DIAGNOSIS — Z515 Encounter for palliative care: Secondary | ICD-10-CM | POA: Diagnosis not present

## 2023-02-16 NOTE — Progress Notes (Signed)
Virtual Visit via Telephone Note  I connected with David Harrell on 02/16/23 at  1:00 PM EST by telephone and verified that I am speaking with the correct person using two identifiers.  Location: Patient: Home Provider: Clinic   I discussed the limitations, risks, security and privacy concerns of performing an evaluation and management service by telephone and the availability of in person appointments. I also discussed with the patient that there may be a patient responsible charge related to this service. The patient expressed understanding and agreed to proceed.   History of Present Illness: David Harrell is a 62 y.o. male with multiple medical problems including stage IIa adenocarcinoma the pancreas status post resection followed by adjuvant chemotherapy.  Patient has history of chronic pain on opioids for many years.  He was referred to palliative care to address goals and manage ongoing symptoms.    Observations/Objective: I called and spoke with patient by phone.  Patient reports he is doing well.  Denies any significant changes or concerns.  Patient is now status post chemotherapy.  Plan is for repeat imaging in 8 to 10 weeks.  Patient continues to take MS Contin/oxycodone and reports that pain is stable.  Dr. Laural Benes, patient's PCP, was previously prescribing pain regimen.  Patient has follow-up visit with Dr. Laural Benes later this month and plans to speak with him about resuming opioid prescribing now that patient has completed chemotherapy.  Assessment and Plan: Stage IIa pancreatic cancer -status post chemotherapy with gemcitabine and Xeloda  Chronic pain -continue regimen of MS Contin/oxycodone.  Patient speaking with PCP about resuming opioid prescribing  Follow Up Instructions: as needed   I discussed the assessment and treatment plan with the patient. The patient was provided an opportunity to ask questions and all were answered. The patient agreed with the plan and  demonstrated an understanding of the instructions.   The patient was advised to call back or seek an in-person evaluation if the symptoms worsen or if the condition fails to improve as anticipated.  I provided 5 minutes of non-face-to-face time during this encounter.   Malachy Moan, NP

## 2023-02-18 ENCOUNTER — Ambulatory Visit: Payer: Medicare HMO

## 2023-02-18 ENCOUNTER — Other Ambulatory Visit: Payer: Medicare HMO

## 2023-02-18 ENCOUNTER — Ambulatory Visit: Payer: Medicare HMO | Admitting: Oncology

## 2023-02-18 ENCOUNTER — Inpatient Hospital Stay: Payer: Medicare HMO

## 2023-02-18 NOTE — Progress Notes (Signed)
Nutrition Follow-up:  Patient with pancreatic cancer.  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  Patient receiving gemcitabine and xeloda.  Has completed treatment.  Spoke with patient via phone for nutrition follow-up.  Reports that appetite is better and feeling better.  Drinking 3 Boost shakes from the [employer].  Yesterday ate bacon and eggs for breakfast.  Lunch was a sandwich and dinner was pork chop and butter beans and corn.  Denies stomach upset, constipation, diarrhea.      Medications: reviewed  Labs: reviewed  Anthropometrics:   Weight 139 lb on home scale, increased  136 lb on 11/22 in clinic 135 lb on 11/8 129 lb on 10/18 139 lb on 8/30 145 lb on 8/2 144 lb on 7/11 143 lb on 5/30   NUTRITION DIAGNOSIS: Inadequate oral intake improving     INTERVENTION:  Continue oral nutrition supplements for additional calories and protein and to promote weight gain Continue high calorie, high protein diet for weight gain Contact number given to patient and will contact RD if needed in the future    NEXT VISIT: no follow-up RD available if needed  Thomos Domine B. Freida Busman, RD, LDN Registered Dietitian [phone]

## 2023-02-25 ENCOUNTER — Ambulatory Visit: Payer: Medicare HMO | Admitting: Oncology

## 2023-02-25 ENCOUNTER — Other Ambulatory Visit: Payer: Medicare HMO

## 2023-02-25 ENCOUNTER — Ambulatory Visit: Payer: Medicare HMO

## 2023-03-02 ENCOUNTER — Other Ambulatory Visit: Payer: Self-pay | Admitting: Oncology

## 2023-03-03 NOTE — Telephone Encounter (Signed)
Component Ref Range & Units (hover) 1 mo ago (01/28/23) 1 mo ago (01/14/23) 1 mo ago (01/07/23) 2 mo ago (12/24/22) 2 mo ago (12/17/22) 2 mo ago (12/10/22) 3 mo ago (11/26/22)  Potassium 3.9 3.8 3.9 3.3 Low  3.6 3.8 3.3 Low

## 2023-03-04 ENCOUNTER — Other Ambulatory Visit: Payer: Medicare HMO

## 2023-03-04 ENCOUNTER — Ambulatory Visit: Payer: Medicare HMO | Admitting: Oncology

## 2023-03-04 ENCOUNTER — Ambulatory Visit: Payer: Medicare HMO

## 2023-03-07 ENCOUNTER — Encounter: Payer: Self-pay | Admitting: Oncology

## 2023-03-08 DIAGNOSIS — R7303 Prediabetes: Secondary | ICD-10-CM | POA: Diagnosis not present

## 2023-03-11 ENCOUNTER — Encounter: Payer: Self-pay | Admitting: Oncology

## 2023-03-21 ENCOUNTER — Other Ambulatory Visit: Payer: Self-pay | Admitting: *Deleted

## 2023-03-21 DIAGNOSIS — C259 Malignant neoplasm of pancreas, unspecified: Secondary | ICD-10-CM

## 2023-03-21 MED ORDER — MORPHINE SULFATE ER 30 MG PO TBCR: 30.0000 mg | EXTENDED_RELEASE_TABLET | Freq: Three times a day (TID) | ORAL | 0 refills | Status: DC

## 2023-03-22 ENCOUNTER — Encounter: Payer: Self-pay | Admitting: Oncology

## 2023-03-23 ENCOUNTER — Inpatient Hospital Stay: Payer: Medicare HMO

## 2023-03-23 ENCOUNTER — Ambulatory Visit: Admission: RE | Admit: 2023-03-23 | Payer: Medicare HMO | Source: Ambulatory Visit

## 2023-03-25 ENCOUNTER — Inpatient Hospital Stay: Payer: Medicare HMO | Attending: Oncology

## 2023-03-25 ENCOUNTER — Encounter
Admission: RE | Admit: 2023-03-25 | Discharge: 2023-03-25 | Disposition: A | Discharge status: Home / Self Care | Payer: Medicare HMO | Source: Ambulatory Visit | Attending: Oncology | Admitting: Oncology

## 2023-03-25 ENCOUNTER — Inpatient Hospital Stay: Payer: Medicare HMO

## 2023-03-25 DIAGNOSIS — R509 Fever, unspecified: Secondary | ICD-10-CM | POA: Insufficient documentation

## 2023-03-25 DIAGNOSIS — Z800 Family history of malignant neoplasm of digestive organs: Secondary | ICD-10-CM | POA: Diagnosis not present

## 2023-03-25 DIAGNOSIS — R10814 Left lower quadrant abdominal tenderness: Secondary | ICD-10-CM | POA: Insufficient documentation

## 2023-03-25 DIAGNOSIS — Z8507 Personal history of malignant neoplasm of pancreas: Secondary | ICD-10-CM | POA: Insufficient documentation

## 2023-03-25 DIAGNOSIS — F17210 Nicotine dependence, cigarettes, uncomplicated: Secondary | ICD-10-CM | POA: Diagnosis not present

## 2023-03-25 DIAGNOSIS — C259 Malignant neoplasm of pancreas, unspecified: Secondary | ICD-10-CM | POA: Diagnosis present

## 2023-03-25 DIAGNOSIS — D72829 Elevated white blood cell count, unspecified: Secondary | ICD-10-CM | POA: Insufficient documentation

## 2023-03-25 DIAGNOSIS — Z9081 Acquired absence of spleen: Secondary | ICD-10-CM | POA: Diagnosis not present

## 2023-03-25 DIAGNOSIS — Z87442 Personal history of urinary calculi: Secondary | ICD-10-CM | POA: Insufficient documentation

## 2023-03-25 LAB — CBC WITH DIFFERENTIAL/PLATELET
Abs Immature Granulocytes: 0.06 10*3/uL (ref 0.00–0.07)
Basophils Absolute: 0.2 10*3/uL — ABNORMAL HIGH (ref 0.0–0.1)
Basophils Relative: 1 %
Eosinophils Absolute: 0.9 10*3/uL — ABNORMAL HIGH (ref 0.0–0.5)
Eosinophils Relative: 7 %
HCT: 38.1 % — ABNORMAL LOW (ref 39.0–52.0)
Hemoglobin: 13 g/dL (ref 13.0–17.0)
Immature Granulocytes: 1 %
Lymphocytes Relative: 35 %
Lymphs Abs: 4.3 10*3/uL — ABNORMAL HIGH (ref 0.7–4.0)
MCH: 32.7 pg (ref 26.0–34.0)
MCHC: 34.1 g/dL (ref 30.0–36.0)
MCV: 96 fL (ref 80.0–100.0)
Monocytes Absolute: 1.2 10*3/uL — ABNORMAL HIGH (ref 0.1–1.0)
Monocytes Relative: 10 %
Neutro Abs: 5.6 10*3/uL (ref 1.7–7.7)
Neutrophils Relative %: 46 %
Platelets: 369 10*3/uL (ref 150–400)
RBC: 3.97 MIL/uL — ABNORMAL LOW (ref 4.22–5.81)
RDW: 16.5 % — ABNORMAL HIGH (ref 11.5–15.5)
WBC: 12.2 10*3/uL — ABNORMAL HIGH (ref 4.0–10.5)
nRBC: 0 % (ref 0.0–0.2)

## 2023-03-25 LAB — GLUCOSE, CAPILLARY: Glucose-Capillary: 103 mg/dL — ABNORMAL HIGH (ref 70–99)

## 2023-03-25 LAB — CMP (CANCER CENTER ONLY)
ALT: 14 U/L (ref 0–44)
AST: 18 U/L (ref 15–41)
Albumin: 4.1 g/dL (ref 3.5–5.0)
Alkaline Phosphatase: 56 U/L (ref 38–126)
Anion gap: 10 (ref 5–15)
BUN: 23 mg/dL (ref 8–23)
CO2: 27 mmol/L (ref 22–32)
Calcium: 8.9 mg/dL (ref 8.9–10.3)
Chloride: 99 mmol/L (ref 98–111)
Creatinine: 0.88 mg/dL (ref 0.61–1.24)
GFR, Estimated: >60 mL/min (ref >60)
Glucose, Bld: 117 mg/dL — ABNORMAL HIGH (ref 70–99)
Potassium: 4.1 mmol/L (ref 3.5–5.1)
Sodium: 136 mmol/L (ref 135–145)
Total Bilirubin: 0.6 mg/dL (ref 0.0–1.2)
Total Protein: 7 g/dL (ref 6.5–8.1)

## 2023-03-25 MED ORDER — FLUDEOXYGLUCOSE F - 18 (FDG) INJECTION
7.0000 | Freq: Once | INTRAVENOUS | Status: AC | PRN
  Administered 2023-03-25: 7.49 via INTRAVENOUS

## 2023-03-25 NOTE — Progress Notes (Signed)
[facility] CSW Progress Note  Clinical Child psychotherapist returned patient's call.  He wanted to verify that the gas card he received from Dallie Piles was deducted from the [facility].  CSW verified that it was.  He had no other needs or questions.   Dorothey Baseman, LCSW Clinical Social Worker [facility]

## 2023-03-26 LAB — CANCER ANTIGEN 19-9: CA 19-9: 34 U/mL (ref 0–35)

## 2023-03-30 ENCOUNTER — Ambulatory Visit: Payer: Medicare HMO | Admitting: Oncology

## 2023-04-01 ENCOUNTER — Inpatient Hospital Stay: Payer: Medicare HMO | Admitting: Oncology

## 2023-04-05 ENCOUNTER — Inpatient Hospital Stay (HOSPITAL_BASED_OUTPATIENT_CLINIC_OR_DEPARTMENT_OTHER): Payer: Medicare HMO | Admitting: Oncology

## 2023-04-05 ENCOUNTER — Inpatient Hospital Stay: Payer: Medicare HMO

## 2023-04-05 ENCOUNTER — Encounter: Payer: Self-pay | Admitting: Oncology

## 2023-04-05 VITALS — BP 127/86 | HR 70 | Temp 99.3°F | Resp 16 | Ht 68.0 in | Wt 133.0 lb

## 2023-04-05 DIAGNOSIS — C259 Malignant neoplasm of pancreas, unspecified: Secondary | ICD-10-CM | POA: Diagnosis not present

## 2023-04-05 DIAGNOSIS — Z8507 Personal history of malignant neoplasm of pancreas: Secondary | ICD-10-CM | POA: Diagnosis not present

## 2023-04-05 LAB — CBC WITH DIFFERENTIAL/PLATELET
Abs Immature Granulocytes: 0.08 10*3/uL — ABNORMAL HIGH (ref 0.00–0.07)
Basophils Absolute: 0.2 10*3/uL — ABNORMAL HIGH (ref 0.0–0.1)
Basophils Relative: 1 %
Eosinophils Absolute: 0.4 10*3/uL (ref 0.0–0.5)
Eosinophils Relative: 3 %
HCT: 41.3 % (ref 39.0–52.0)
Hemoglobin: 13.9 g/dL (ref 13.0–17.0)
Immature Granulocytes: 1 %
Lymphocytes Relative: 28 %
Lymphs Abs: 3.8 10*3/uL (ref 0.7–4.0)
MCH: 32.5 pg (ref 26.0–34.0)
MCHC: 33.7 g/dL (ref 30.0–36.0)
MCV: 96.5 fL (ref 80.0–100.0)
Monocytes Absolute: 1.2 10*3/uL — ABNORMAL HIGH (ref 0.1–1.0)
Monocytes Relative: 8 %
Neutro Abs: 8.2 10*3/uL — ABNORMAL HIGH (ref 1.7–7.7)
Neutrophils Relative %: 59 %
Platelets: 388 10*3/uL (ref 150–400)
RBC: 4.28 MIL/uL (ref 4.22–5.81)
RDW: 15.2 % (ref 11.5–15.5)
WBC: 13.8 10*3/uL — ABNORMAL HIGH (ref 4.0–10.5)
nRBC: 0 % (ref 0.0–0.2)

## 2023-04-05 LAB — CMP (CANCER CENTER ONLY)
ALT: 13 U/L (ref 0–44)
AST: 18 U/L (ref 15–41)
Albumin: 4.5 g/dL (ref 3.5–5.0)
Alkaline Phosphatase: 67 U/L (ref 38–126)
Anion gap: 11 (ref 5–15)
BUN: 13 mg/dL (ref 8–23)
CO2: 28 mmol/L (ref 22–32)
Calcium: 9.4 mg/dL (ref 8.9–10.3)
Chloride: 101 mmol/L (ref 98–111)
Creatinine: 0.7 mg/dL (ref 0.61–1.24)
GFR, Estimated: >60 mL/min (ref >60)
Glucose, Bld: 107 mg/dL — ABNORMAL HIGH (ref 70–99)
Potassium: 3.9 mmol/L (ref 3.5–5.1)
Sodium: 140 mmol/L (ref 135–145)
Total Bilirubin: 0.6 mg/dL (ref 0.0–1.2)
Total Protein: 8 g/dL (ref 6.5–8.1)

## 2023-04-05 NOTE — Progress Notes (Signed)
[facility]  Telephone:(336) [phone] [phone]) [phone]  ID: [name] OB: Sep 30, 1960  MR#: [id number]  AOZ#:[id number]  Patient Care Team: Gracelyn Nurse, MD as PCP - General (Internal Medicine) Benita Gutter, RN as Oncology Nurse Navigator Orlie Dakin, Tollie Pizza, MD as Consulting Physician (Oncology)   CHIEF COMPLAINT: Pathologic stage IIa adenocarcinoma the pancreas.  INTERVAL HISTORY: Patient returns to clinic today for further evaluation and discussion of his PET scan results.  Over the past several weeks he has developed pain in his left lower quadrant, but otherwise feels well.  He also reports low-grade fevers yesterday while at home.  He has no neurologic complaints.  He has no chest pain, shortness of breath, cough, or hemoptysis.  He denies any nausea, vomiting, constipation, or diarrhea.  He has no melena or hematochezia.  He has no urinary complaints.  Patient offers no further specific complaints today.  REVIEW OF SYSTEMS:   Review of Systems  Constitutional:  Positive for fever. Negative for malaise/fatigue and weight loss.  Respiratory: Negative.  Negative for cough, hemoptysis and shortness of breath.   Cardiovascular: Negative.  Negative for chest pain and leg swelling.  Gastrointestinal:  Positive for abdominal pain. Negative for blood in stool, diarrhea, melena, nausea and vomiting.  Genitourinary: Negative.  Negative for dysuria.  Musculoskeletal: Negative.  Negative for back pain.  Skin: Negative.  Negative for rash.  Neurological: Negative.  Negative for dizziness, focal weakness, weakness and headaches.  Psychiatric/Behavioral: Negative.  The patient is not nervous/anxious.     As per HPI. Otherwise, a complete review of systems is negative.  PAST MEDICAL HISTORY: Past Medical History:  Diagnosis Date   Arthritis    GERD (gastroesophageal reflux disease)    Hiatal hernia    History of kidney stones    Hypertension    Pancreatic  cancer (HCC) 2024    PAST SURGICAL HISTORY: Past Surgical History:  Procedure Laterality Date   ANTERIOR CERVICAL DECOMP/DISCECTOMY FUSION     BACK SURGERY     x5   ESOPHAGOGASTRODUODENOSCOPY N/A 07/12/2022   Procedure: ESOPHAGOGASTRODUODENOSCOPY (EGD);  Surgeon: Lemar Lofty., MD;  Location: [facility];  Service: Gastroenterology;  Laterality: N/A;   ESOPHAGOGASTRODUODENOSCOPY (EGD) WITH PROPOFOL N/A 07/05/2022   Procedure: ESOPHAGOGASTRODUODENOSCOPY (EGD) WITH PROPOFOL;  Surgeon: [name], DO;  Location: [facility] ENDOSCOPY;  Service: Gastroenterology;  Laterality: N/A;   EUS N/A 07/12/2022   Procedure: UPPER ENDOSCOPIC ULTRASOUND (EUS) RADIAL;  Surgeon: Lemar Lofty., MD;  Location: [facility] ENDOSCOPY;  Service: Gastroenterology;  Laterality: N/A;   FINE NEEDLE ASPIRATION  07/12/2022   Procedure: FINE NEEDLE ASPIRATION;  Surgeon: Lemar Lofty., MD;  Location: [facility] ENDOSCOPY;  Service: Gastroenterology;;   IR CV LINE INJECTION  12/30/2022   IR IMAGING GUIDED PORT INSERTION  09/29/2022   LAPAROSCOPY N/A 08/18/2022   Procedure: STAGING LAPAROSCOPY;  Surgeon: Fritzi Mandes, MD;  Location: [facility];  Service: General;  Laterality: N/A;   OPERATIVE ULTRASOUND N/A 08/18/2022   Procedure: INTRAOPERATIVE ULTRASOUND;  Surgeon: Fritzi Mandes, MD;  Location: [facility] OR;  Service: General;  Laterality: N/A;   SHOULDER ARTHROSCOPY Bilateral    possibly metal in right shoulder, pt unsure   SPLENECTOMY, TOTAL N/A 08/18/2022   Procedure: SPLENECTOMY;  Surgeon: Fritzi Mandes, MD;  Location: [facility] OR;  Service: General;  Laterality: N/A;    FAMILY HISTORY: Family History  Problem Relation Age of Onset   Liver cancer Sister    Spina bifida Paternal Grandfather  ADVANCED DIRECTIVES (Y/N):  N  HEALTH MAINTENANCE: Social History   Tobacco Use   Smoking status: Every Day    Current packs/day: 0.50    Average packs/day: 0.5 packs/day for 40.0 years (20.0 ttl pk-yrs)     Types: Cigarettes   Smokeless tobacco: Never  Vaping Use   Vaping status: Never Used  Substance Use Topics   Alcohol use: Not Currently   Drug use: Yes    Frequency: 1.0 times per week    Types: Marijuana     Colonoscopy:  PAP:  Bone density:  Lipid panel:  Allergies  Allergen Reactions   Aspirin     Acid reflux     Pregabalin     Dizziness   Tape     Blisters skin, paper tape is ok   Codeine Palpitations    headaches   Oxymorphone Rash   Penicillins Hives and Rash   Vicodin [Hydrocodone-Acetaminophen] Palpitations    Headaches     Current Outpatient Medications  Medication Sig Dispense Refill   ALPRAZolam (XANAX) 1 MG tablet Take 1 mg by mouth 2 (two) times daily.     budesonide-formoterol (SYMBICORT) 160-4.5 MCG/ACT inhaler Inhale 2 puffs into the lungs 2 (two) times daily as needed (shortness of breath).     lidocaine-prilocaine (EMLA) cream Apply to affected area once 30 g 3   lisinopril (ZESTRIL) 20 MG tablet Take 20 mg by mouth daily.     meloxicam (MOBIC) 15 MG tablet Take 15 mg by mouth daily.     methocarbamol (ROBAXIN) 500 MG tablet Take 2 tablets (1,000 mg total) by mouth every 6 (six) hours as needed. 60 tablet 0   morphine (MS CONTIN) 30 MG 12 hr tablet Take 1 tablet (30 mg total) by mouth every 8 (eight) hours. 90 tablet 0   omeprazole (PRILOSEC OTC) 20 MG tablet Take 1 tablet (20 mg total) by mouth daily. 90 tablet 1   omeprazole (PRILOSEC) 20 MG capsule TAKE 1 CAPSULE BY MOUTH EVERY DAY 90 capsule 0   ondansetron (ZOFRAN) 8 MG tablet Take 1 tablet (8 mg total) by mouth every 8 (eight) hours as needed for nausea or vomiting. 60 tablet 1   Oxycodone HCl 10 MG TABS Take 1 tablet (10 mg total) by mouth every 4 (four) hours as needed (breakthrough pain). 20 tablet 0   pantoprazole (PROTONIX) 40 MG tablet Take 1 tablet (40 mg total) by mouth daily. 60 tablet 0   prochlorperazine (COMPAZINE) 10 MG tablet Take 1 tablet (10 mg total) by mouth every 6 (six) hours  as needed for nausea or vomiting. 60 tablet 2   rosuvastatin (CRESTOR) 20 MG tablet Take 20 mg by mouth daily.     acetaminophen (TYLENOL) 500 MG tablet Take 2 tablets (1,000 mg total) by mouth 3 (three) times daily. (Patient not taking: Reported on 04/05/2023) 30 tablet 0   capecitabine (XELODA) 500 MG tablet Take 3 tablets (1,500 mg total) by mouth 2 (two) times daily after a meal. Take for 21 days, then hold for 7 days. Repeat every 28 days. (Patient not taking: Reported on 04/05/2023) 126 tablet 1   KLOR-CON M20 20 MEQ tablet TAKE 1 TABLET BY MOUTH EVERY DAY (Patient not taking: Reported on 04/05/2023) 90 tablet 1   polyethylene glycol (MIRALAX / GLYCOLAX) 17 g packet Take 17 g by mouth daily. (Patient not taking: Reported on 04/05/2023) 14 each 0   senna (SENOKOT) 8.6 MG TABS tablet Take 2 tablets (17.2 mg total) by mouth  at bedtime. (Patient not taking: Reported on 04/05/2023) 120 tablet 0   No current facility-administered medications for this visit.    OBJECTIVE: Vitals:   04/05/23 1430  BP: 127/86  Pulse: 70  Resp: 16  Temp: 99.3 F (37.4 C)  SpO2: 98%       Body mass index is 20.22 kg/m.    ECOG FS:0 - Asymptomatic  General: Well-developed, well-nourished, no acute distress. Eyes: Pink conjunctiva, anicteric sclera. HEENT: Normocephalic, moist mucous membranes. Lungs: No audible wheezing or coughing. Heart: Regular rate and rhythm. Abdomen: Soft, mild tenderness in left lower quadrant, no obvious distention. Musculoskeletal: No edema, cyanosis, or clubbing. Neuro: Alert, answering all questions appropriately. Cranial nerves grossly intact. Skin: No rashes or petechiae noted. Psych: Normal affect.   LAB RESULTS:  Lab Results  Component Value Date   NA 140 04/05/2023   K 3.9 04/05/2023   CL 101 04/05/2023   CO2 28 04/05/2023   GLUCOSE 107 (H) 04/05/2023   BUN 13 04/05/2023   CREATININE 0.70 04/05/2023   CALCIUM 9.4 04/05/2023   PROT 8.0 04/05/2023   ALBUMIN 4.5  04/05/2023   AST 18 04/05/2023   ALT 13 04/05/2023   ALKPHOS 67 04/05/2023   BILITOT 0.6 04/05/2023   GFRNONAA >60 04/05/2023   GFRAA >60 04/13/2018    Lab Results  Component Value Date   WBC 12.2 (H) 03/25/2023   NEUTROABS 5.6 03/25/2023   HGB 13.0 03/25/2023   HCT 38.1 (L) 03/25/2023   MCV 96.0 03/25/2023   PLT 369 03/25/2023     STUDIES: NM PET Image Restag (PS) Skull Base To Thigh Result Date: 04/01/2023 CLINICAL DATA:  Subsequent treatment strategy for pancreatic cancer. EXAM: NUCLEAR MEDICINE PET SKULL BASE TO THIGH TECHNIQUE: 7.49 mCi F-18 FDG was injected intravenously. Full-ring PET imaging was performed from the skull base to thigh after the radiotracer. CT data was obtained and used for attenuation correction and anatomic localization. Fasting blood glucose: 103 mg/dl COMPARISON:  3 FDG PET scan 07/21/2022, MRI abdomen 07/02/2022 FINDINGS: Mediastinal blood pool activity: SUV max Liver activity: SUV max NA NECK: No hypermetabolic lymph nodes in the neck. Incidental CT findings: None. CHEST: No hypermetabolic mediastinal or hilar nodes. No suspicious pulmonary nodules on the CT scan. Incidental CT findings: Small nodule along the LEFT oblique fissure unchanged. ABDOMEN/PELVIS: Post partial pancreatectomy. There is a triangular soft tissue lesion in the region of the partial pancreatectomy adjacent to the greater curvature of the stomach measuring 16 mm x 16 mm on image 96/6. This soft tissue lesion is moderately hypermetabolic with SUV max equal 4.3 (image 95) No hypermetabolic lymph nodes in the upper abdomen. No abnormal metabolic activity in the liver. Incidental CT findings: Post splenectomy. SKELETON: No focal hypermetabolic activity to suggest skeletal metastasis. Posterior lumbar fusion Incidental CT findings: None. IMPRESSION: 1. Post partial pancreatectomy. No clear evidence of local recurrence or metastasis. 2. Soft tissue density in the LEFT upper quadrant adjacent to the  greater curvature of stomach in the region of the pancreatic resection indeterminate. Moderate metabolic activity of this lesion. Consider dedicated CT pancreatic protocol (without and with contrast contrast) for further evaluation. 3. Stable small pulmonary nodules. Electronically Signed   By: Genevive Bi M.D.   On: 04/01/2023 09:42    ASSESSMENT: Pathologic stage IIa adenocarcinoma the pancreas.  PLAN:    Pathologic stage IIa adenocarcinoma the pancreas: Patient underwent surgical resection on August 18, 2022 confirming stage of disease.  Previously, PET scan did not reveal any metastatic  disease.  Preoperatively patient's CA 19-9 was 130, currently within normal limits at 34.  Recommendation was to pursue adjuvant chemotherapy using gemcitabine and capecitabine.  Patient received weekly gemcitabine on days 1, 8, and 15 with oral capecitabine days 1 through 21.  This was a 28-day cycle.  Initial plan was to do 4-6 cycles, but given the cumulative effects of treatment and noncompliance, patient discontinued treatment after cycle 4 on January 28, 2023.  Repeat PET scan on April 01, 2023 reviewed independently and reported as above with no obvious evidence of recurrent or progressive disease. Consider repeat imaging with CT scan in 3 months. Pain: Patient does not complain of this today.  Patient has been on chronic narcotics for greater than 20 years.  Currently on MS Contin with as needed oxycodone.    Anemia: Resolved.  Leukocytosis: Mildly elevated at 13.8. Monitor. Nausea: Patient does not complain of this today.  Continue oral antiemetics as prescribed. Port tenderness: Improved.  No obvious etiology.  Dye study was reported as normal. 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 Abdominal pain: Unclear etiology. Imaging as above.  Return to clinic in 3 weeks for further evaluation.  If pain is still evident, will consider repeat imaging with CT scan.  I spent a total of 30  minutes reviewing chart data, face-to-face evaluation with the patient, counseling and coordination of care as detailed above.  Patient expressed understanding and was in agreement with this plan. He also understands that He can call clinic at any time with any questions, concerns, or complaints.    Cancer Staging  Pancreatic adenocarcinoma [facility]) Staging form: Exocrine Pancreas, AJCC 8th Edition - Clinical stage from 07/16/2022: Stage IIA (cT3, cN0, cM0) - Signed by Jeralyn Ruths, MD on 07/16/2022 Stage prefix: Initial diagnosis Total positive nodes: 0   Jeralyn Ruths, MD   04/05/2023 4:04 PM

## 2023-04-05 NOTE — Progress Notes (Signed)
States he feels very fatigued and has no energy. Having a lot of pain in lower left quadrant in the last couple weeks since he had his pancreas and spleen removed. Wants to ask about vaccines. States he has noticed running higher temps at home the last few days ranging 100

## 2023-04-06 LAB — CANCER ANTIGEN 19-9: CA 19-9: 37 U/mL — ABNORMAL HIGH (ref 0–35)

## 2023-04-14 ENCOUNTER — Other Ambulatory Visit: Payer: Self-pay | Admitting: Oncology

## 2023-04-14 DIAGNOSIS — C259 Malignant neoplasm of pancreas, unspecified: Secondary | ICD-10-CM

## 2023-04-15 ENCOUNTER — Encounter: Payer: Self-pay | Admitting: Oncology

## 2023-04-16 ENCOUNTER — Other Ambulatory Visit: Payer: Self-pay | Admitting: Nurse Practitioner

## 2023-04-22 ENCOUNTER — Encounter: Payer: Self-pay | Admitting: Oncology

## 2023-04-25 ENCOUNTER — Inpatient Hospital Stay: Payer: Medicare HMO

## 2023-04-25 ENCOUNTER — Inpatient Hospital Stay: Payer: Medicare HMO | Admitting: Oncology

## 2023-04-28 ENCOUNTER — Other Ambulatory Visit: Payer: Self-pay | Admitting: *Deleted

## 2023-04-28 DIAGNOSIS — C259 Malignant neoplasm of pancreas, unspecified: Secondary | ICD-10-CM

## 2023-04-28 NOTE — Telephone Encounter (Signed)
 Needs refill

## 2023-04-29 ENCOUNTER — Encounter: Payer: Self-pay | Admitting: Oncology

## 2023-04-29 MED ORDER — MORPHINE SULFATE ER 30 MG PO TBCR
30.0000 mg | EXTENDED_RELEASE_TABLET | Freq: Three times a day (TID) | ORAL | 0 refills | Status: DC
Start: 2023-04-29 — End: 2023-06-08

## 2023-05-11 ENCOUNTER — Encounter: Payer: Self-pay | Admitting: Oncology

## 2023-05-16 ENCOUNTER — Inpatient Hospital Stay (HOSPITAL_BASED_OUTPATIENT_CLINIC_OR_DEPARTMENT_OTHER): Payer: Medicare HMO | Admitting: Oncology

## 2023-05-16 ENCOUNTER — Inpatient Hospital Stay: Payer: Medicare HMO | Attending: Oncology

## 2023-05-16 VITALS — BP 128/78 | HR 65 | Temp 99.2°F | Resp 16 | Ht 68.0 in | Wt 131.0 lb

## 2023-05-16 DIAGNOSIS — Z800 Family history of malignant neoplasm of digestive organs: Secondary | ICD-10-CM | POA: Insufficient documentation

## 2023-05-16 DIAGNOSIS — Z8507 Personal history of malignant neoplasm of pancreas: Secondary | ICD-10-CM | POA: Diagnosis not present

## 2023-05-16 DIAGNOSIS — C259 Malignant neoplasm of pancreas, unspecified: Secondary | ICD-10-CM

## 2023-05-16 DIAGNOSIS — R109 Unspecified abdominal pain: Secondary | ICD-10-CM | POA: Diagnosis not present

## 2023-05-16 DIAGNOSIS — D72829 Elevated white blood cell count, unspecified: Secondary | ICD-10-CM | POA: Diagnosis not present

## 2023-05-16 DIAGNOSIS — Z9081 Acquired absence of spleen: Secondary | ICD-10-CM | POA: Diagnosis not present

## 2023-05-16 DIAGNOSIS — F17210 Nicotine dependence, cigarettes, uncomplicated: Secondary | ICD-10-CM | POA: Diagnosis not present

## 2023-05-16 LAB — CBC WITH DIFFERENTIAL/PLATELET
Abs Immature Granulocytes: 0.04 10*3/uL (ref 0.00–0.07)
Basophils Absolute: 0.1 10*3/uL (ref 0.0–0.1)
Basophils Relative: 1 %
Eosinophils Absolute: 0.3 10*3/uL (ref 0.0–0.5)
Eosinophils Relative: 3 %
HCT: 40.7 % (ref 39.0–52.0)
Hemoglobin: 13.6 g/dL (ref 13.0–17.0)
Immature Granulocytes: 0 %
Lymphocytes Relative: 37 %
Lymphs Abs: 4.4 10*3/uL — ABNORMAL HIGH (ref 0.7–4.0)
MCH: 31.3 pg (ref 26.0–34.0)
MCHC: 33.4 g/dL (ref 30.0–36.0)
MCV: 93.8 fL (ref 80.0–100.0)
Monocytes Absolute: 1.1 10*3/uL — ABNORMAL HIGH (ref 0.1–1.0)
Monocytes Relative: 10 %
Neutro Abs: 5.7 10*3/uL (ref 1.7–7.7)
Neutrophils Relative %: 49 %
Platelets: 345 10*3/uL (ref 150–400)
RBC: 4.34 MIL/uL (ref 4.22–5.81)
RDW: 12.8 % (ref 11.5–15.5)
WBC: 11.7 10*3/uL — ABNORMAL HIGH (ref 4.0–10.5)
nRBC: 0 % (ref 0.0–0.2)

## 2023-05-16 LAB — CMP (CANCER CENTER ONLY)
ALT: 11 U/L (ref 0–44)
AST: 19 U/L (ref 15–41)
Albumin: 4.6 g/dL (ref 3.5–5.0)
Alkaline Phosphatase: 46 U/L (ref 38–126)
Anion gap: 10 (ref 5–15)
BUN: 11 mg/dL (ref 8–23)
CO2: 28 mmol/L (ref 22–32)
Calcium: 9.1 mg/dL (ref 8.9–10.3)
Chloride: 99 mmol/L (ref 98–111)
Creatinine: 0.7 mg/dL (ref 0.61–1.24)
GFR, Estimated: >60 mL/min (ref >60)
Glucose, Bld: 136 mg/dL — ABNORMAL HIGH (ref 70–99)
Potassium: 3.7 mmol/L (ref 3.5–5.1)
Sodium: 137 mmol/L (ref 135–145)
Total Bilirubin: 0.5 mg/dL (ref 0.0–1.2)
Total Protein: 7.7 g/dL (ref 6.5–8.1)

## 2023-05-16 NOTE — Progress Notes (Unsigned)
 Concerns about teeth falling out

## 2023-05-16 NOTE — Progress Notes (Unsigned)
 [facility]  [phone]) [phone] Fax:(336) [phone]  ID: [facility] OB: 03-Sep-1960  MR#: [id number]  YQI#:[id number]  Patient Care Team: Gracelyn Nurse, MD as PCP - General (Internal Medicine) Benita Gutter, RN as Oncology Nurse Navigator Orlie Dakin, Tollie Pizza, MD as Consulting Physician (Oncology)   CHIEF COMPLAINT: Pathologic stage IIa adenocarcinoma the pancreas.  INTERVAL HISTORY: Patient returns to clinic today for further evaluation.  His abdominal pain has now resolved and he feels back to his baseline.  He currently feels well and is asymptomatic.  He does not complain of any weakness or fatigue.  He has no neurologic complaints.  He has no chest pain, shortness of breath, cough, or hemoptysis.  He denies any nausea, vomiting, constipation, or diarrhea.  He has no melena or hematochezia.  He has no urinary complaints.  Patient offers no specific complaints today.  REVIEW OF SYSTEMS:   Review of Systems  Constitutional: Negative.  Negative for fever, malaise/fatigue and weight loss.  Respiratory: Negative.  Negative for cough, hemoptysis and shortness of breath.   Cardiovascular: Negative.  Negative for chest pain and leg swelling.  Gastrointestinal: Negative.  Negative for abdominal pain, blood in stool, diarrhea, melena, nausea and vomiting.  Genitourinary: Negative.  Negative for dysuria.  Musculoskeletal: Negative.  Negative for back pain.  Skin: Negative.  Negative for rash.  Neurological: Negative.  Negative for dizziness, focal weakness, weakness and headaches.  Psychiatric/Behavioral: Negative.  The patient is not nervous/anxious.     As per HPI. Otherwise, a complete review of systems is negative.  PAST MEDICAL HISTORY: Past Medical History:  Diagnosis Date   Arthritis    GERD (gastroesophageal reflux disease)    Hiatal hernia    History of kidney stones    Hypertension    Pancreatic cancer (HCC) 2024    PAST SURGICAL  HISTORY: Past Surgical History:  Procedure Laterality Date   ANTERIOR CERVICAL DECOMP/DISCECTOMY FUSION     BACK SURGERY     x5   ESOPHAGOGASTRODUODENOSCOPY N/A 07/12/2022   Procedure: ESOPHAGOGASTRODUODENOSCOPY (EGD);  Surgeon: Lemar Lofty., MD;  Location: [facility];  Service: Gastroenterology;  Laterality: N/A;   ESOPHAGOGASTRODUODENOSCOPY (EGD) WITH PROPOFOL N/A 07/05/2022   Procedure: ESOPHAGOGASTRODUODENOSCOPY (EGD) WITH PROPOFOL;  Surgeon: [name], DO;  Location: [facility] END[facility];  Service: Gastroenterology;  Laterality: N/A;   EUS N/A 07/12/2022   Procedure: UPPER ENDOSCOPIC ULTRASOUND (EUS) RADIAL;  Surgeon: Lemar Lofty., MD;  Location: [facility] ENDOSCOPY;  Service: Gastroenterology;  Laterality: N/A;   FINE NEEDLE ASPIRATION  07/12/2022   Procedure: FINE NEEDLE ASPIRATION;  Surgeon: Lemar Lofty., MD;  Location: [facility] ENDOSCOPY;  Service: Gastroenterology;;   IR CV LINE INJECTION  12/30/2022   IR IMAGING GUIDED PORT INSERTION  09/29/2022   LAPAROSCOPY N/A 08/18/2022   Procedure: STAGING LAPAROSCOPY;  Surgeon: Fritzi Mandes, MD;  Location: [facility] OR;  Service: General;  Laterality: N/A;   OPERATIVE ULTRASOUND N/A 08/18/2022   Procedure: INTRAOPERATIVE ULTRASOUND;  Surgeon: Fritzi Mandes, MD;  Location: [facility] OR;  Service: General;  Laterality: N/A;   SHOULDER ARTHROSCOPY Bilateral    possibly metal in right shoulder, pt unsure   SPLENECTOMY, TOTAL N/A 08/18/2022   Procedure: SPLENECTOMY;  Surgeon: Fritzi Mandes, MD;  Location: [facility] OR;  Service: General;  Laterality: N/A;    FAMILY HISTORY: Family History  Problem Relation Age of Onset   Liver cancer Sister    Spina bifida Paternal Grandfather     ADVANCED DIRECTIVES (Y/N):  N  HEALTH MAINTENANCE: Social History   Tobacco Use   Smoking status: Every Day    Current packs/day: 0.50    Average packs/day: 0.5 packs/day for 40.0 years (20.0 ttl pk-yrs)    Types: Cigarettes   Smokeless  tobacco: Never  Vaping Use   Vaping status: Never Used  Substance Use Topics   Alcohol use: Not Currently   Drug use: Yes    Frequency: 1.0 times per week    Types: Marijuana     Colonoscopy:  PAP:  Bone density:  Lipid panel:  Allergies  Allergen Reactions   Aspirin     Acid reflux     Pregabalin     Dizziness   Tape     Blisters skin, paper tape is ok   Codeine Palpitations    headaches   Oxymorphone Rash   Penicillins Hives and Rash   Vicodin [Hydrocodone-Acetaminophen] Palpitations    Headaches     Current Outpatient Medications  Medication Sig Dispense Refill   ALPRAZolam (XANAX) 1 MG tablet Take 1 mg by mouth 2 (two) times daily.     budesonide-formoterol (SYMBICORT) 160-4.5 MCG/ACT inhaler Inhale 2 puffs into the lungs 2 (two) times daily as needed (shortness of breath).     lidocaine-prilocaine (EMLA) cream Apply to affected area once 30 g 3   lisinopril (ZESTRIL) 20 MG tablet Take 20 mg by mouth daily.     meloxicam (MOBIC) 15 MG tablet Take 15 mg by mouth daily.     methocarbamol (ROBAXIN) 500 MG tablet Take 2 tablets (1,000 mg total) by mouth every 6 (six) hours as needed. 60 tablet 0   morphine (MS CONTIN) 30 MG 12 hr tablet Take 1 tablet (30 mg total) by mouth every 8 (eight) hours. 90 tablet 0   omeprazole (PRILOSEC OTC) 20 MG tablet Take 1 tablet (20 mg total) by mouth daily. 90 tablet 1   omeprazole (PRILOSEC) 20 MG capsule TAKE 1 CAPSULE BY MOUTH EVERY DAY 90 capsule 0   ondansetron (ZOFRAN) 8 MG tablet Take 1 tablet (8 mg total) by mouth every 8 (eight) hours as needed for nausea or vomiting. 60 tablet 1   Oxycodone HCl 10 MG TABS Take 1 tablet (10 mg total) by mouth every 4 (four) hours as needed (breakthrough pain). 20 tablet 0   pantoprazole (PROTONIX) 40 MG tablet Take 1 tablet (40 mg total) by mouth daily. 60 tablet 0   prochlorperazine (COMPAZINE) 10 MG tablet TAKE 1 TABLET BY MOUTH EVERY 6 HOURS AS NEEDED FOR NAUSEA OR VOMITING. 60 tablet 2    rosuvastatin (CRESTOR) 20 MG tablet Take 20 mg by mouth daily.     acetaminophen (TYLENOL) 500 MG tablet Take 2 tablets (1,000 mg total) by mouth 3 (three) times daily. (Patient not taking: Reported on 10/15/2022) 30 tablet 0   capecitabine (XELODA) 500 MG tablet Take 3 tablets (1,500 mg total) by mouth 2 (two) times daily after a meal. Take for 21 days, then hold for 7 days. Repeat every 28 days. (Patient not taking: Reported on 05/16/2023) 126 tablet 1   KLOR-CON M20 20 MEQ tablet TAKE 1 TABLET BY MOUTH EVERY DAY (Patient not taking: Reported on 05/16/2023) 90 tablet 1   polyethylene glycol (MIRALAX / GLYCOLAX) 17 g packet Take 17 g by mouth daily. (Patient not taking: Reported on 05/16/2023) 14 each 0   senna (SENOKOT) 8.6 MG TABS tablet Take 2 tablets (17.2 mg total) by mouth at bedtime. (Patient not taking: Reported on 05/16/2023) 120 tablet  0   No current facility-administered medications for this visit.    OBJECTIVE: Vitals:   05/16/23 1428  BP: 128/78  Pulse: 65  Resp: 16  Temp: 99.2 F (37.3 C)  SpO2: 99%       Body mass index is 19.92 kg/m.    ECOG FS:0 - Asymptomatic  General: Well-developed, well-nourished, no acute distress. Eyes: Pink conjunctiva, anicteric sclera. HEENT: Normocephalic, moist mucous membranes. Lungs: No audible wheezing or coughing. Heart: Regular rate and rhythm. Abdomen: Soft, nontender, no obvious distention. Musculoskeletal: No edema, cyanosis, or clubbing. Neuro: Alert, answering all questions appropriately. Cranial nerves grossly intact. Skin: No rashes or petechiae noted. Psych: Normal affect.  LAB RESULTS:  Lab Results  Component Value Date   NA 137 05/16/2023   K 3.7 05/16/2023   CL 99 05/16/2023   CO2 28 05/16/2023   GLUCOSE 136 (H) 05/16/2023   BUN 11 05/16/2023   CREATININE 0.70 05/16/2023   CALCIUM 9.1 05/16/2023   PROT 7.7 05/16/2023   ALBUMIN 4.6 05/16/2023   AST 19 05/16/2023   ALT 11 05/16/2023   ALKPHOS 46 05/16/2023    BILITOT 0.5 05/16/2023   GFRNONAA >60 05/16/2023   GFRAA >60 04/13/2018    Lab Results  Component Value Date   WBC 11.7 (H) 05/16/2023   NEUTROABS 5.7 05/16/2023   HGB 13.6 05/16/2023   HCT 40.7 05/16/2023   MCV 93.8 05/16/2023   PLT 345 05/16/2023     STUDIES: No results found.   ASSESSMENT: Pathologic stage IIa adenocarcinoma the pancreas.  PLAN:    Pathologic stage IIa adenocarcinoma the pancreas: Patient underwent surgical resection on August 18, 2022 confirming stage of disease.  Previously, PET scan did not reveal any metastatic disease.  Preoperatively patient's CA 19-9 was 130.  His most recent result was only mildly elevated at 37. Initial plan was to do 4-6 cycles of gemcitabine and Abraxane, but given the cumulative effects of treatment and noncompliance, patient discontinued treatment after cycle 4 on January 28, 2023.  Repeat PET scan on April 01, 2023 reviewed independently and with no obvious evidence of recurrent or progressive disease.  Return to clinic in April 2025 for repeat imaging and further evaluation.   Pain: Patient does not complain of this today.  Patient has been on chronic narcotics for greater than 20 years.  Currently on MS Contin with as needed oxycodone.    Leukocytosis: Elevated, but improved.  Monitor.   Nausea: Patient does not complain of this today.  Continue oral antiemetics as prescribed. Port tenderness: Improved.  No obvious etiology.  Dye study was reported as normal. 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 Abdominal pain: Unclear etiology.  Resolved.  Repeat imaging in April as above.   Patient expressed understanding and was in agreement with this plan. He also understands that He can call clinic at any time with any questions, concerns, or complaints.    Cancer Staging  Pancreatic adenocarcinoma [facility]) Staging form: Exocrine Pancreas, AJCC 8th Edition - Clinical stage from 07/16/2022: Stage IIA (cT3, cN0, cM0) -  Signed by Jeralyn Ruths, MD on 07/16/2022 Stage prefix: Initial diagnosis Total positive nodes: 0   Jeralyn Ruths, MD   05/17/2023 2:49 PM

## 2023-05-17 ENCOUNTER — Encounter: Payer: Self-pay | Admitting: Oncology

## 2023-05-17 LAB — CANCER ANTIGEN 19-9: CA 19-9: 43 U/mL — ABNORMAL HIGH (ref 0–35)

## 2023-05-23 ENCOUNTER — Telehealth: Payer: Self-pay | Admitting: *Deleted

## 2023-05-23 ENCOUNTER — Encounter: Payer: Self-pay | Admitting: Oncology

## 2023-05-23 ENCOUNTER — Other Ambulatory Visit: Payer: Self-pay | Admitting: Hospice and Palliative Medicine

## 2023-05-23 NOTE — Telephone Encounter (Signed)
 [facility] got a message from the pt that he wants to have his port accessed for the scan on Friday of th is week and [facility]rell is ok for the pt to get port accessed and they will take the needle out after the scan has been done.

## 2023-05-27 ENCOUNTER — Ambulatory Visit
Admission: RE | Admit: 2023-05-27 | Discharge: 2023-05-27 | Disposition: A | Discharge status: Home / Self Care | Source: Ambulatory Visit | Attending: Oncology | Admitting: Oncology

## 2023-05-27 DIAGNOSIS — C259 Malignant neoplasm of pancreas, unspecified: Secondary | ICD-10-CM

## 2023-05-27 MED ORDER — HEPARIN SOD (PORK) LOCK FLUSH 100 UNIT/ML IV SOLN
500.0000 [IU] | Freq: Once | INTRAVENOUS | Status: AC
  Administered 2023-05-27: 500 [IU] via INTRAVENOUS

## 2023-05-27 MED ORDER — SODIUM CHLORIDE 0.9% FLUSH
10.0000 mL | INTRAVENOUS | Status: DC | PRN
  Administered 2023-05-27: 10 mL via INTRAVENOUS

## 2023-05-27 MED ORDER — IOPAMIDOL (ISOVUE-370) INJECTION 76%
80.0000 mL | Freq: Once | INTRAVENOUS | Status: AC | PRN
  Administered 2023-05-27: 80 mL via INTRAVENOUS

## 2023-06-08 ENCOUNTER — Telehealth: Payer: Self-pay | Admitting: *Deleted

## 2023-06-08 ENCOUNTER — Other Ambulatory Visit: Payer: Self-pay | Admitting: *Deleted

## 2023-06-08 DIAGNOSIS — C259 Malignant neoplasm of pancreas, unspecified: Secondary | ICD-10-CM

## 2023-06-08 MED ORDER — MORPHINE SULFATE ER 30 MG PO TBCR
30.0000 mg | EXTENDED_RELEASE_TABLET | Freq: Three times a day (TID) | ORAL | 0 refills | Status: DC
Start: 2023-06-08 — End: 2023-07-06

## 2023-06-08 NOTE — Telephone Encounter (Signed)
 I called and told the pt that the scan not read yet, we have a shortage of radiology and Enrique Sack the nurse will look and see when it is read and then move the appt to see finnegan sooner than the 4/30 appt that is on the appt now. Pt is good with this plan

## 2023-06-08 NOTE — Telephone Encounter (Signed)
 The patient wants his scan results and it was done 3/21, his next appt to see patient is 4/30 for  labs and see MD.  If you can let me know about scan issue.

## 2023-06-13 ENCOUNTER — Other Ambulatory Visit: Payer: Self-pay | Admitting: *Deleted

## 2023-06-13 DIAGNOSIS — C259 Malignant neoplasm of pancreas, unspecified: Secondary | ICD-10-CM

## 2023-06-14 ENCOUNTER — Inpatient Hospital Stay (HOSPITAL_BASED_OUTPATIENT_CLINIC_OR_DEPARTMENT_OTHER): Admitting: Oncology

## 2023-06-14 ENCOUNTER — Inpatient Hospital Stay: Attending: Oncology

## 2023-06-14 VITALS — BP 131/75 | HR 66 | Temp 98.4°F | Resp 16 | Ht 68.0 in | Wt 130.0 lb

## 2023-06-14 DIAGNOSIS — Z8507 Personal history of malignant neoplasm of pancreas: Secondary | ICD-10-CM | POA: Diagnosis not present

## 2023-06-14 DIAGNOSIS — I10 Essential (primary) hypertension: Secondary | ICD-10-CM | POA: Insufficient documentation

## 2023-06-14 DIAGNOSIS — Z87442 Personal history of urinary calculi: Secondary | ICD-10-CM | POA: Diagnosis not present

## 2023-06-14 DIAGNOSIS — C259 Malignant neoplasm of pancreas, unspecified: Secondary | ICD-10-CM

## 2023-06-14 DIAGNOSIS — Z9081 Acquired absence of spleen: Secondary | ICD-10-CM | POA: Diagnosis not present

## 2023-06-14 LAB — CBC WITH DIFFERENTIAL/PLATELET
Abs Immature Granulocytes: 0.04 10*3/uL (ref 0.00–0.07)
Basophils Absolute: 0.1 10*3/uL (ref 0.0–0.1)
Basophils Relative: 1 %
Eosinophils Absolute: 0.5 10*3/uL (ref 0.0–0.5)
Eosinophils Relative: 5 %
HCT: 41 % (ref 39.0–52.0)
Hemoglobin: 13.6 g/dL (ref 13.0–17.0)
Immature Granulocytes: 0 %
Lymphocytes Relative: 42 %
Lymphs Abs: 4 10*3/uL (ref 0.7–4.0)
MCH: 30.9 pg (ref 26.0–34.0)
MCHC: 33.2 g/dL (ref 30.0–36.0)
MCV: 93.2 fL (ref 80.0–100.0)
Monocytes Absolute: 0.7 10*3/uL (ref 0.1–1.0)
Monocytes Relative: 7 %
Neutro Abs: 4.1 10*3/uL (ref 1.7–7.7)
Neutrophils Relative %: 45 %
Platelets: 344 10*3/uL (ref 150–400)
RBC: 4.4 MIL/uL (ref 4.22–5.81)
RDW: 13.5 % (ref 11.5–15.5)
WBC: 9.4 10*3/uL (ref 4.0–10.5)
nRBC: 0 % (ref 0.0–0.2)

## 2023-06-14 LAB — CMP (CANCER CENTER ONLY)
ALT: 13 U/L (ref 0–44)
AST: 19 U/L (ref 15–41)
Albumin: 4.3 g/dL (ref 3.5–5.0)
Alkaline Phosphatase: 58 U/L (ref 38–126)
Anion gap: 9 (ref 5–15)
BUN: 8 mg/dL (ref 8–23)
CO2: 29 mmol/L (ref 22–32)
Calcium: 8.8 mg/dL — ABNORMAL LOW (ref 8.9–10.3)
Chloride: 103 mmol/L (ref 98–111)
Creatinine: 0.51 mg/dL — ABNORMAL LOW (ref 0.61–1.24)
GFR, Estimated: >60 mL/min (ref >60)
Glucose, Bld: 161 mg/dL — ABNORMAL HIGH (ref 70–99)
Potassium: 4.3 mmol/L (ref 3.5–5.1)
Sodium: 141 mmol/L (ref 135–145)
Total Bilirubin: 0.5 mg/dL (ref 0.0–1.2)
Total Protein: 7.5 g/dL (ref 6.5–8.1)

## 2023-06-14 NOTE — Progress Notes (Unsigned)
 [facility]  Telephone:(336) [phone] Fax:(336) [phone]  ID: David Harrell OB: 03-09-1960  MR#: [id number]  AOZ#:[id number]  Patient Care Team: Gracelyn Nurse, MD as PCP - General (Internal Medicine) Benita Gutter, RN as Oncology Nurse Navigator Orlie Dakin, Tollie Pizza, MD as Consulting Physician (Oncology)   CHIEF COMPLAINT: Pathologic stage IIa adenocarcinoma the pancreas.  INTERVAL HISTORY: Patient returns to clinic today for further evaluation.  His abdominal pain has now resolved and he feels back to his baseline.  He currently feels well and is asymptomatic.  He does not complain of any weakness or fatigue.  He has no neurologic complaints.  He has no chest pain, shortness of breath, cough, or hemoptysis.  He denies any nausea, vomiting, constipation, or diarrhea.  He has no melena or hematochezia.  He has no urinary complaints.  Patient offers no specific complaints today.  REVIEW OF SYSTEMS:   Review of Systems  Constitutional: Negative.  Negative for fever, malaise/fatigue and weight loss.  Respiratory: Negative.  Negative for cough, hemoptysis and shortness of breath.   Cardiovascular: Negative.  Negative for chest pain and leg swelling.  Gastrointestinal: Negative.  Negative for abdominal pain, blood in stool, diarrhea, melena, nausea and vomiting.  Genitourinary: Negative.  Negative for dysuria.  Musculoskeletal: Negative.  Negative for back pain.  Skin: Negative.  Negative for rash.  Neurological: Negative.  Negative for dizziness, focal weakness, weakness and headaches.  Psychiatric/Behavioral: Negative.  The patient is not nervous/anxious.     As per HPI. Otherwise, a complete review of systems is negative.  PAST MEDICAL HISTORY: Past Medical History:  Diagnosis Date  . Arthritis   . GERD (gastroesophageal reflux disease)   . Hiatal hernia   . History of kidney stones   . Hypertension   . Pancreatic cancer (HCC) 2024    PAST SURGICAL  HISTORY: Past Surgical History:  Procedure Laterality Date  . ANTERIOR CERVICAL DECOMP/DISCECTOMY FUSION    . BACK SURGERY     x5  . ESOPHAGOGASTRODUODENOSCOPY N/A 07/12/2022   Procedure: ESOPHAGOGASTRODUODENOSCOPY (EGD);  Surgeon: Lemar Lofty., MD;  Location: [facility];  Service: Gastroenterology;  Laterality: N/A;  . ESOPHAGOGASTRODUODENOSCOPY (EGD) WITH PROPOFOL N/A 07/05/2022   Procedure: ESOPHAGOGASTRODUODENOSCOPY (EGD) WITH PROPOFOL;  Surgeon: [name], DO;  Location: [facility] END[facility]COPY;  Service: Gastroenterology;  Laterality: N/A;  . EUS N/A 07/12/2022   Procedure: UPPER ENDOSCOPIC ULTRASOUND (EUS) RADIAL;  Surgeon: Meridee Score Netty Starring., MD;  Location: [facility]COPY;  Service: Gastroenterology;  Laterality: N/A;  . FINE NEEDLE ASPIRATION  07/12/2022   Procedure: FINE NEEDLE ASPIRATION;  Surgeon: Meridee Score Netty Starring., MD;  Location: [facility] ENDOSCOPY;  Service: Gastroenterology;;  . IR CV LINE INJECTION  12/30/2022  . IR IMAGING GUIDED PORT INSERTION  09/29/2022  . LAPAROSCOPY N/A 08/18/2022   Procedure: STAGING LAPAROSCOPY;  Surgeon: Fritzi Mandes, MD;  Location: [facility] OR;  Service: General;  Laterality: N/A;  . OPERATIVE ULTRASOUND N/A 08/18/2022   Procedure: INTRAOPERATIVE ULTRASOUND;  Surgeon: Fritzi Mandes, MD;  Location: [facility] OR;  Service: General;  Laterality: N/A;  . SHOULDER ARTHROSCOPY Bilateral    possibly metal in right shoulder, pt unsure  . SPLENECTOMY, TOTAL N/A 08/18/2022   Procedure: SPLENECTOMY;  Surgeon: Fritzi Mandes, MD;  Location: [facility] OR;  Service: General;  Laterality: N/A;    FAMILY HISTORY: Family History  Problem Relation Age of Onset  . Liver cancer Sister   . Spina bifida Paternal Grandfather     ADVANCED DIRECTIVES (Y/N):  N  HEALTH MAINTENANCE: Social History   Tobacco Use  . Smoking status: Every Day    Current packs/day: 0.50    Average packs/day: 0.5 packs/day for 40.0 years (20.0 ttl pk-yrs)    Types: Cigarettes  .  Smokeless tobacco: Never  Vaping Use  . Vaping status: Never Used  Substance Use Topics  . Alcohol use: Not Currently  . Drug use: Yes    Frequency: 1.0 times per week    Types: Marijuana     Colonoscopy:  PAP:  Bone density:  Lipid panel:  Allergies  Allergen Reactions  . Aspirin     Acid reflux    . Pregabalin     Dizziness  . Tape     Blisters skin, paper tape is ok  . Codeine Palpitations    headaches  . Oxymorphone Rash  . Penicillins Hives and Rash  . Vicodin [Hydrocodone-Acetaminophen] Palpitations    Headaches     Current Outpatient Medications  Medication Sig Dispense Refill  . ALPRAZolam (XANAX) 1 MG tablet Take 1 mg by mouth 2 (two) times daily.    . budesonide-formoterol (SYMBICORT) 160-4.5 MCG/ACT inhaler Inhale 2 puffs into the lungs 2 (two) times daily as needed (shortness of breath).    . lidocaine-prilocaine (EMLA) cream Apply to affected area once 30 g 3  . lisinopril (ZESTRIL) 20 MG tablet Take 20 mg by mouth daily.    . meloxicam (MOBIC) 15 MG tablet Take 15 mg by mouth daily.    . methocarbamol (ROBAXIN) 500 MG tablet Take 2 tablets (1,000 mg total) by mouth every 6 (six) hours as needed. 60 tablet 0  . morphine (MS CONTIN) 30 MG 12 hr tablet Take 1 tablet (30 mg total) by mouth every 8 (eight) hours. 90 tablet 0  . omeprazole (PRILOSEC OTC) 20 MG tablet Take 1 tablet (20 mg total) by mouth daily. 90 tablet 1  . omeprazole (PRILOSEC) 20 MG capsule TAKE 1 CAPSULE BY MOUTH EVERY DAY 90 capsule 0  . ondansetron (ZOFRAN) 8 MG tablet Take 1 tablet (8 mg total) by mouth every 8 (eight) hours as needed for nausea or vomiting. 60 tablet 1  . Oxycodone HCl 10 MG TABS Take 1 tablet (10 mg total) by mouth every 4 (four) hours as needed (breakthrough pain). 20 tablet 0  . pantoprazole (PROTONIX) 40 MG tablet TAKE 1 TABLET BY MOUTH EVERY DAY 60 tablet 0  . prochlorperazine (COMPAZINE) 10 MG tablet TAKE 1 TABLET BY MOUTH EVERY 6 HOURS AS NEEDED FOR NAUSEA OR  VOMITING. 60 tablet 2  . rosuvastatin (CRESTOR) 20 MG tablet Take 20 mg by mouth daily.    Marland Kitchen acetaminophen (TYLENOL) 500 MG tablet Take 2 tablets (1,000 mg total) by mouth 3 (three) times daily. (Patient not taking: Reported on 06/14/2023) 30 tablet 0  . capecitabine (XELODA) 500 MG tablet Take 3 tablets (1,500 mg total) by mouth 2 (two) times daily after a meal. Take for 21 days, then hold for 7 days. Repeat every 28 days. (Patient not taking: Reported on 04/05/2023) 126 tablet 1  . KLOR-CON M20 20 MEQ tablet TAKE 1 TABLET BY MOUTH EVERY DAY (Patient not taking: Reported on 04/05/2023) 90 tablet 1  . polyethylene glycol (MIRALAX / GLYCOLAX) 17 g packet Take 17 g by mouth daily. (Patient not taking: Reported on 04/05/2023) 14 each 0  . senna (SENOKOT) 8.6 MG TABS tablet Take 2 tablets (17.2 mg total) by mouth at bedtime. (Patient not taking: Reported on 04/05/2023) 120 tablet 0  No current facility-administered medications for this visit.    OBJECTIVE: Vitals:   06/14/23 1349  BP: 131/75  Pulse: 66  Resp: 16  Temp: 98.4 F (36.9 C)  SpO2: 98%       Body mass index is 19.77 kg/m.    ECOG FS:0 - Asymptomatic  General: Well-developed, well-nourished, no acute distress. Eyes: Pink conjunctiva, anicteric sclera. HEENT: Normocephalic, moist mucous membranes. Lungs: No audible wheezing or coughing. Heart: Regular rate and rhythm. Abdomen: Soft, nontender, no obvious distention. Musculoskeletal: No edema, cyanosis, or clubbing. Neuro: Alert, answering all questions appropriately. Cranial nerves grossly intact. Skin: No rashes or petechiae noted. Psych: Normal affect.  LAB RESULTS:  Lab Results  Component Value Date   NA 141 06/14/2023   K 4.3 06/14/2023   CL 103 06/14/2023   CO2 29 06/14/2023   GLUCOSE 161 (H) 06/14/2023   BUN 8 06/14/2023   CREATININE 0.51 (L) 06/14/2023   CALCIUM 8.8 (L) 06/14/2023   PROT 7.5 06/14/2023   ALBUMIN 4.3 06/14/2023   AST 19 06/14/2023   ALT 13  06/14/2023   ALKPHOS 58 06/14/2023   BILITOT 0.5 06/14/2023   GFRNONAA >60 06/14/2023   GFRAA >60 04/13/2018    Lab Results  Component Value Date   WBC 9.4 06/14/2023   NEUTROABS 4.1 06/14/2023   HGB 13.6 06/14/2023   HCT 41.0 06/14/2023   MCV 93.2 06/14/2023   PLT 344 06/14/2023     STUDIES: CT ABD PELVIS W/WO CM ONCOLOGY PANCREATIC PROTOCOL Result Date: 06/09/2023 CLINICAL DATA:  Follow-up pancreatic cancer EXAM: CT ABDOMEN AND PELVIS WITHOUT AND WITH CONTRAST TECHNIQUE: Multidetector CT imaging of the abdomen and pelvis was performed following the standard protocol before and following the bolus administration of intravenous contrast. RADIATION DOSE REDUCTION: This exam was performed according to the departmental dose-optimization program which includes automated exposure control, adjustment of the mA and/or kV according to patient size and/or use of iterative reconstruction technique. CONTRAST:  80mL ISOVUE-370 IOPAMIDOL (ISOVUE-370) INJECTION 76% COMPARISON:  PET-CT dated 03/25/2023 FINDINGS: Lower chest: Lung bases are clear. Hepatobiliary: Scattered subcentimeter hepatic cysts. Gallbladder is unremarkable. No intrahepatic or extrahepatic ductal dilatation. Pancreas: Status post distal pancreatectomy. 14 mm nonenhancing soft tissue density lesion at the surgical margin (series 8/image 35), likely reflecting a postoperative seroma/hematoma, non FDG avid on prior PET. Spleen: Surgically absent. Adrenals/Urinary Tract: 17 mm left adrenal nodule (series 4/image 17), hypermetabolic on recent PET and new from preoperative studies, suspicious for adrenal metastasis. Right adrenal glands are within normal limits. Kidneys are within normal limits.  No hydronephrosis. Thick-walled bladder, although underdistended, favoring sequela of chronic bladder outlet obstruction in this clinical context. Stomach/Bowel: Stomach is within normal limits. No evidence of bowel obstruction. Normal appendix (series  8/image 81). No colonic wall thickening or inflammatory changes. Vascular/Lymphatic: No evidence of abdominal aortic aneurysm. Atherosclerotic calcifications of the abdominal aorta and branch vessels, although vessels remain patent. No suspicious abdominopelvic lymphadenopathy. Reproductive: Prostatomegaly, with nodular enlargement of the central gland which indents the base of the bladder, suggesting BPH. Other: No abdominopelvic ascites. No peritoneal nodularity/omental caking. Musculoskeletal: Status post PLIF at L5-S1. IMPRESSION: Status post distal pancreatectomy and splenectomy. 17 mm left adrenal nodule, hypermetabolic on recent PET, suspicious for adrenal metastasis. 14 mm nonenhancing soft tissue density lesion at the surgical margin, non FDG avid on prior PET, likely reflecting a postoperative seroma/hematoma. 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 Electronically Signed   By: Charline Bills M.D.   On: 06/09/2023 01:09  ASSESSMENT: Pathologic stage IIa adenocarcinoma the pancreas.  PLAN:    Pathologic stage IIa adenocarcinoma the pancreas: Patient underwent surgical resection on August 18, 2022 confirming stage of disease.  Previously, PET scan did not reveal any metastatic disease.  Preoperatively patient's CA 19-9 was 130.  His most recent result was only mildly elevated at 37. Initial plan was to do 4-6 cycles of gemcitabine and Abraxane, but given the cumulative effects of treatment and noncompliance, patient discontinued treatment after cycle 4 on January 28, 2023.  Repeat PET scan on April 01, 2023 reviewed independently and with no obvious evidence of recurrent or progressive disease.  Return to clinic in April 2025 for repeat imaging and further evaluation.   Pain: Patient does not complain of this today.  Patient has been on chronic narcotics for greater than 20 years.  Currently on MS Contin with as needed oxycodone.    Leukocytosis: Elevated, but improved.   Monitor.   Nausea: Patient does not complain of this today.  Continue oral antiemetics as prescribed. Port tenderness: Improved.  No obvious etiology.  Dye study was reported as normal. 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 Abdominal pain: Unclear etiology.  Resolved.  Repeat imaging in April as above.   Patient expressed understanding and was in agreement with this plan. He also understands that He can call clinic at any time with any questions, concerns, or complaints.    Cancer Staging  Pancreatic adenocarcinoma [facility]) Staging form: Exocrine Pancreas, AJCC 8th Edition - Clinical stage from 07/16/2022: Stage IIA (cT3, cN0, cM0) - Signed by Jeralyn Ruths, MD on 07/16/2022 Stage prefix: Initial diagnosis Total positive nodes: 0   Jeralyn Ruths, MD   06/14/2023 2:08 PM

## 2023-06-14 NOTE — Progress Notes (Unsigned)
 States that the pain in the left lower quadrant that he has been having has moved more up to his rib cage area. Questions about vaccines.

## 2023-06-15 LAB — CANCER ANTIGEN 19-9: CA 19-9: 37 U/mL — ABNORMAL HIGH (ref 0–35)

## 2023-06-16 ENCOUNTER — Encounter: Payer: Self-pay | Admitting: Oncology

## 2023-06-23 ENCOUNTER — Other Ambulatory Visit: Payer: Self-pay | Admitting: *Deleted

## 2023-06-23 ENCOUNTER — Other Ambulatory Visit

## 2023-06-23 ENCOUNTER — Telehealth: Payer: Self-pay | Admitting: *Deleted

## 2023-06-23 DIAGNOSIS — C259 Malignant neoplasm of pancreas, unspecified: Secondary | ICD-10-CM

## 2023-06-23 NOTE — Progress Notes (Signed)
MDT

## 2023-06-23 NOTE — Telephone Encounter (Signed)
 Call placed to patient to review tumor board recommendations from today. It was recommended that patient have repeat imaging and labs with repeat CA 19-9 in 2 weeks to re-evaluate adrenal nodule. Patient is agreeable and verbalized understanding with plan. Orders entered and scheduling message sent.

## 2023-07-05 ENCOUNTER — Telehealth: Payer: Self-pay | Admitting: *Deleted

## 2023-07-05 NOTE — Telephone Encounter (Signed)
 Dr. Adrian Alba as well as Jerrilyn Moras Borders wanted me to check up that the patient is going to start getting his MS Contin  through his PCP and his date of his next appointment to see the PCP is May 7.  The patient said that he probably will have to have the last MS Contin  prescriptions from bending in because he was able to do it through PCP after May 7.

## 2023-07-06 ENCOUNTER — Other Ambulatory Visit: Payer: Self-pay | Admitting: *Deleted

## 2023-07-06 ENCOUNTER — Ambulatory Visit: Admitting: Oncology

## 2023-07-06 ENCOUNTER — Other Ambulatory Visit

## 2023-07-06 DIAGNOSIS — C259 Malignant neoplasm of pancreas, unspecified: Secondary | ICD-10-CM

## 2023-07-06 MED ORDER — MORPHINE SULFATE ER 30 MG PO TBCR: 30.0000 mg | EXTENDED_RELEASE_TABLET | Freq: Three times a day (TID) | ORAL | 0 refills | Status: AC

## 2023-07-08 ENCOUNTER — Telehealth: Payer: Self-pay | Admitting: *Deleted

## 2023-07-08 DIAGNOSIS — R739 Hyperglycemia, unspecified: Secondary | ICD-10-CM | POA: Diagnosis not present

## 2023-07-08 DIAGNOSIS — C259 Malignant neoplasm of pancreas, unspecified: Secondary | ICD-10-CM

## 2023-07-08 MED ORDER — PROCHLORPERAZINE MALEATE 10 MG PO TABS
10.0000 mg | ORAL_TABLET | Freq: Four times a day (QID) | ORAL | 2 refills | Status: DC | PRN
Start: 2023-07-08 — End: 2023-07-29

## 2023-07-08 NOTE — Telephone Encounter (Signed)
 I got David Harrell to refill the prochlorperazine  to send it and I called the patient to say I sent it and his voicemail is full and can't leave a message

## 2023-07-11 ENCOUNTER — Inpatient Hospital Stay: Attending: Oncology

## 2023-07-11 ENCOUNTER — Ambulatory Visit
Admission: RE | Admit: 2023-07-11 | Discharge: 2023-07-11 | Disposition: A | Discharge status: Home / Self Care | Source: Ambulatory Visit | Attending: Oncology | Admitting: Oncology

## 2023-07-11 DIAGNOSIS — N400 Enlarged prostate without lower urinary tract symptoms: Secondary | ICD-10-CM | POA: Diagnosis not present

## 2023-07-11 DIAGNOSIS — C259 Malignant neoplasm of pancreas, unspecified: Secondary | ICD-10-CM | POA: Diagnosis not present

## 2023-07-11 DIAGNOSIS — N3289 Other specified disorders of bladder: Secondary | ICD-10-CM | POA: Diagnosis not present

## 2023-07-11 DIAGNOSIS — Z8507 Personal history of malignant neoplasm of pancreas: Secondary | ICD-10-CM | POA: Insufficient documentation

## 2023-07-11 DIAGNOSIS — N281 Cyst of kidney, acquired: Secondary | ICD-10-CM | POA: Diagnosis not present

## 2023-07-11 LAB — CMP (CANCER CENTER ONLY)
ALT: 14 U/L (ref 0–44)
AST: 18 U/L (ref 15–41)
Albumin: 4.1 g/dL (ref 3.5–5.0)
Alkaline Phosphatase: 57 U/L (ref 38–126)
Anion gap: 8 (ref 5–15)
BUN: 11 mg/dL (ref 8–23)
CO2: 28 mmol/L (ref 22–32)
Calcium: 8.7 mg/dL — ABNORMAL LOW (ref 8.9–10.3)
Chloride: 101 mmol/L (ref 98–111)
Creatinine: 0.72 mg/dL (ref 0.61–1.24)
GFR, Estimated: >60 mL/min (ref >60)
Glucose, Bld: 138 mg/dL — ABNORMAL HIGH (ref 70–99)
Potassium: 3.8 mmol/L (ref 3.5–5.1)
Sodium: 137 mmol/L (ref 135–145)
Total Bilirubin: 0.3 mg/dL (ref 0.0–1.2)
Total Protein: 7.3 g/dL (ref 6.5–8.1)

## 2023-07-11 LAB — CBC WITH DIFFERENTIAL/PLATELET
Abs Immature Granulocytes: 0.03 10*3/uL (ref 0.00–0.07)
Basophils Absolute: 0.2 10*3/uL — ABNORMAL HIGH (ref 0.0–0.1)
Basophils Relative: 2 %
Eosinophils Absolute: 0.6 10*3/uL — ABNORMAL HIGH (ref 0.0–0.5)
Eosinophils Relative: 6 %
HCT: 39.4 % (ref 39.0–52.0)
Hemoglobin: 13.4 g/dL (ref 13.0–17.0)
Immature Granulocytes: 0 %
Lymphocytes Relative: 40 %
Lymphs Abs: 4.1 10*3/uL — ABNORMAL HIGH (ref 0.7–4.0)
MCH: 30.9 pg (ref 26.0–34.0)
MCHC: 34 g/dL (ref 30.0–36.0)
MCV: 90.8 fL (ref 80.0–100.0)
Monocytes Absolute: 0.9 10*3/uL (ref 0.1–1.0)
Monocytes Relative: 9 %
Neutro Abs: 4.4 10*3/uL (ref 1.7–7.7)
Neutrophils Relative %: 43 %
Platelets: 342 10*3/uL (ref 150–400)
RBC: 4.34 MIL/uL (ref 4.22–5.81)
RDW: 14.3 % (ref 11.5–15.5)
WBC: 10 10*3/uL (ref 4.0–10.5)
nRBC: 0 % (ref 0.0–0.2)

## 2023-07-11 MED ORDER — HEPARIN SOD (PORK) LOCK FLUSH 100 UNIT/ML IV SOLN
500.0000 [IU] | Freq: Once | INTRAVENOUS | Status: AC
  Administered 2023-07-11: 500 [IU] via INTRAVENOUS
  Filled 2023-07-11: qty 5

## 2023-07-11 MED ORDER — IOHEXOL 300 MG/ML  SOLN
100.0000 mL | Freq: Once | INTRAMUSCULAR | Status: AC | PRN
  Administered 2023-07-11: 100 mL via INTRAVENOUS

## 2023-07-11 MED ORDER — HEPARIN SOD (PORK) LOCK FLUSH 100 UNIT/ML IV SOLN
INTRAVENOUS | Status: AC
  Filled 2023-07-11: qty 5

## 2023-07-12 LAB — CANCER ANTIGEN 19-9: CA 19-9: 42 U/mL — ABNORMAL HIGH (ref 0–35)

## 2023-07-13 DIAGNOSIS — E7800 Pure hypercholesterolemia, unspecified: Secondary | ICD-10-CM | POA: Diagnosis not present

## 2023-07-13 DIAGNOSIS — G8929 Other chronic pain: Secondary | ICD-10-CM | POA: Diagnosis not present

## 2023-07-13 DIAGNOSIS — M545 Low back pain, unspecified: Secondary | ICD-10-CM | POA: Diagnosis not present

## 2023-07-13 DIAGNOSIS — R7303 Prediabetes: Secondary | ICD-10-CM | POA: Diagnosis not present

## 2023-07-13 DIAGNOSIS — I10 Essential (primary) hypertension: Secondary | ICD-10-CM | POA: Diagnosis not present

## 2023-07-13 DIAGNOSIS — F17210 Nicotine dependence, cigarettes, uncomplicated: Secondary | ICD-10-CM | POA: Diagnosis not present

## 2023-07-14 ENCOUNTER — Other Ambulatory Visit: Payer: Self-pay | Admitting: Hospice and Palliative Medicine

## 2023-07-26 ENCOUNTER — Encounter

## 2023-07-26 ENCOUNTER — Telehealth: Payer: Self-pay | Admitting: *Deleted

## 2023-07-26 NOTE — Telephone Encounter (Signed)
 I have called in saying that he will not make it in to the cancer center because he is having diarrhea and vomiting.  The wife says that she will call later to get another appointment.

## 2023-07-29 ENCOUNTER — Other Ambulatory Visit: Payer: Self-pay | Admitting: *Deleted

## 2023-07-29 DIAGNOSIS — C259 Malignant neoplasm of pancreas, unspecified: Secondary | ICD-10-CM

## 2023-07-29 MED ORDER — PROCHLORPERAZINE MALEATE 10 MG PO TABS: 10.0000 mg | ORAL_TABLET | Freq: Four times a day (QID) | ORAL | 2 refills | Status: DC | PRN

## 2023-08-09 ENCOUNTER — Telehealth: Payer: Self-pay | Admitting: *Deleted

## 2023-08-09 NOTE — Telephone Encounter (Signed)
 The patient states that he went back to Dr. Lincoln Renshaw doing the morphine  but he says that he only gives them 1 a day and when Dr. Ronalee Cocking again was giving him the medicine it was every 8 hours as needed.  The patient wants Dr. Ronalee Cocking again or his nurse to talk to Dr. Lincoln Renshaw and see if they can put it back to what David Harrell was giving him each month

## 2023-08-12 ENCOUNTER — Inpatient Hospital Stay: Attending: Oncology

## 2023-08-14 ENCOUNTER — Other Ambulatory Visit: Payer: Self-pay | Admitting: Oncology

## 2023-08-15 ENCOUNTER — Encounter: Payer: Self-pay | Admitting: Oncology

## 2023-08-29 ENCOUNTER — Telehealth: Payer: Self-pay

## 2023-08-29 ENCOUNTER — Ambulatory Visit

## 2023-08-29 NOTE — Telephone Encounter (Signed)
 Patient left message regarding CT scan stated that he went there today and it was cancelled and no one notified him. He is requesting paperwork that was never received regarding scan. Asking for call back from schedulers to explain what happened.

## 2023-09-13 ENCOUNTER — Ambulatory Visit: Admitting: Oncology

## 2023-09-13 ENCOUNTER — Other Ambulatory Visit

## 2023-10-10 ENCOUNTER — Ambulatory Visit: Admission: RE | Admit: 2023-10-10 | Source: Ambulatory Visit

## 2023-10-10 ENCOUNTER — Inpatient Hospital Stay

## 2023-10-14 ENCOUNTER — Ambulatory Visit
Admission: RE | Admit: 2023-10-14 | Discharge: 2023-10-14 | Disposition: A | Discharge status: Home / Self Care | Source: Ambulatory Visit | Attending: Oncology | Admitting: Oncology

## 2023-10-14 ENCOUNTER — Inpatient Hospital Stay: Attending: Oncology

## 2023-10-14 DIAGNOSIS — D72829 Elevated white blood cell count, unspecified: Secondary | ICD-10-CM | POA: Insufficient documentation

## 2023-10-14 DIAGNOSIS — K5730 Diverticulosis of large intestine without perforation or abscess without bleeding: Secondary | ICD-10-CM | POA: Diagnosis not present

## 2023-10-14 DIAGNOSIS — Z9081 Acquired absence of spleen: Secondary | ICD-10-CM | POA: Insufficient documentation

## 2023-10-14 DIAGNOSIS — C259 Malignant neoplasm of pancreas, unspecified: Secondary | ICD-10-CM | POA: Insufficient documentation

## 2023-10-14 DIAGNOSIS — N281 Cyst of kidney, acquired: Secondary | ICD-10-CM | POA: Diagnosis not present

## 2023-10-14 DIAGNOSIS — Z8507 Personal history of malignant neoplasm of pancreas: Secondary | ICD-10-CM | POA: Insufficient documentation

## 2023-10-14 DIAGNOSIS — I10 Essential (primary) hypertension: Secondary | ICD-10-CM | POA: Insufficient documentation

## 2023-10-14 MED ORDER — IOHEXOL 300 MG/ML  SOLN
100.0000 mL | Freq: Once | INTRAMUSCULAR | Status: AC | PRN
  Administered 2023-10-14: 100 mL via INTRAVENOUS

## 2023-10-17 ENCOUNTER — Encounter: Payer: Self-pay | Admitting: Oncology

## 2023-10-17 ENCOUNTER — Inpatient Hospital Stay

## 2023-10-17 ENCOUNTER — Inpatient Hospital Stay (HOSPITAL_BASED_OUTPATIENT_CLINIC_OR_DEPARTMENT_OTHER): Attending: Oncology | Admitting: Oncology

## 2023-10-17 VITALS — BP 145/83 | HR 82 | Temp 98.0°F | Resp 16 | Wt 127.2 lb

## 2023-10-17 DIAGNOSIS — Z9081 Acquired absence of spleen: Secondary | ICD-10-CM | POA: Diagnosis not present

## 2023-10-17 DIAGNOSIS — D72829 Elevated white blood cell count, unspecified: Secondary | ICD-10-CM | POA: Diagnosis not present

## 2023-10-17 DIAGNOSIS — C259 Malignant neoplasm of pancreas, unspecified: Secondary | ICD-10-CM

## 2023-10-17 DIAGNOSIS — Z8507 Personal history of malignant neoplasm of pancreas: Secondary | ICD-10-CM | POA: Diagnosis not present

## 2023-10-17 DIAGNOSIS — I10 Essential (primary) hypertension: Secondary | ICD-10-CM | POA: Diagnosis not present

## 2023-10-17 LAB — CMP (CANCER CENTER ONLY)
ALT: 13 U/L (ref 0–44)
AST: 20 U/L (ref 15–41)
Albumin: 4.2 g/dL (ref 3.5–5.0)
Alkaline Phosphatase: 63 U/L (ref 38–126)
Anion gap: 9 (ref 5–15)
BUN: 9 mg/dL (ref 8–23)
CO2: 26 mmol/L (ref 22–32)
Calcium: 9.2 mg/dL (ref 8.9–10.3)
Chloride: 103 mmol/L (ref 98–111)
Creatinine: 0.65 mg/dL (ref 0.61–1.24)
GFR, Estimated: >60 mL/min (ref >60)
Glucose, Bld: 120 mg/dL — ABNORMAL HIGH (ref 70–99)
Potassium: 3.7 mmol/L (ref 3.5–5.1)
Sodium: 138 mmol/L (ref 135–145)
Total Bilirubin: 0.4 mg/dL (ref 0.0–1.2)
Total Protein: 7.6 g/dL (ref 6.5–8.1)

## 2023-10-17 LAB — CBC WITH DIFFERENTIAL/PLATELET
Abs Immature Granulocytes: 0.03 K/uL (ref 0.00–0.07)
Basophils Absolute: 0.1 K/uL (ref 0.0–0.1)
Basophils Relative: 1 %
Eosinophils Absolute: 0.5 K/uL (ref 0.0–0.5)
Eosinophils Relative: 4 %
HCT: 42.5 % (ref 39.0–52.0)
Hemoglobin: 14.5 g/dL (ref 13.0–17.0)
Immature Granulocytes: 0 %
Lymphocytes Relative: 36 %
Lymphs Abs: 4.4 K/uL — ABNORMAL HIGH (ref 0.7–4.0)
MCH: 30.9 pg (ref 26.0–34.0)
MCHC: 34.1 g/dL (ref 30.0–36.0)
MCV: 90.4 fL (ref 80.0–100.0)
Monocytes Absolute: 0.8 K/uL (ref 0.1–1.0)
Monocytes Relative: 7 %
Neutro Abs: 6.2 K/uL (ref 1.7–7.7)
Neutrophils Relative %: 52 %
Platelets: 397 K/uL (ref 150–400)
RBC: 4.7 MIL/uL (ref 4.22–5.81)
RDW: 12.9 % (ref 11.5–15.5)
Smear Review: NORMAL
WBC: 12.1 K/uL — ABNORMAL HIGH (ref 4.0–10.5)
nRBC: 0 % (ref 0.0–0.2)

## 2023-10-17 MED ORDER — LIDOCAINE-PRILOCAINE 2.5-2.5 % EX CREA: TOPICAL_CREAM | CUTANEOUS | 3 refills | Status: AC

## 2023-10-17 NOTE — Progress Notes (Signed)
 [facility]  Telephone:(336) [phone] Fax:(336) [phone]  ID: Marinda LELON Landsman OB: 1961-02-03  MR#: [id number]  RDW#:[id number]  Patient Care Team: Rudolpho Norleen BIRCH, MD as PCP - General (Internal Medicine) Maurie Rayfield BIRCH, RN as Oncology Nurse Navigator Jacobo, Evalene PARAS, MD as Consulting Physician (Oncology)   CHIEF COMPLAINT: Pathologic stage IIa adenocarcinoma the pancreas.  INTERVAL HISTORY: Patient returns to clinic today for routine 8-month evaluation and discussion of his imaging results.  He continues to feel well and remains asymptomatic.  He does not complain of any pain today.  He denies any weakness or fatigue.  He has no neurologic complaints.  He has no chest pain, shortness of breath, cough, or hemoptysis.  He denies any nausea, vomiting, constipation, or diarrhea.  He has no melena or hematochezia.  He has no urinary complaints.  Patient offers no specific complaints today.  REVIEW OF SYSTEMS:   Review of Systems  Constitutional: Negative.  Negative for fever, malaise/fatigue and weight loss.  Respiratory: Negative.  Negative for cough, hemoptysis and shortness of breath.   Cardiovascular: Negative.  Negative for chest pain and leg swelling.  Gastrointestinal: Negative.  Negative for abdominal pain, blood in stool, diarrhea, melena, nausea and vomiting.  Genitourinary: Negative.  Negative for dysuria.  Musculoskeletal: Negative.  Negative for back pain.  Skin: Negative.  Negative for rash.  Neurological: Negative.  Negative for dizziness, focal weakness, weakness and headaches.  Psychiatric/Behavioral: Negative.  The patient is not nervous/anxious.     As per HPI. Otherwise, a complete review of systems is negative.  PAST MEDICAL HISTORY: Past Medical History:  Diagnosis Date   Arthritis    GERD (gastroesophageal reflux disease)    Hiatal hernia    History of kidney stones    Hypertension    Pancreatic cancer (HCC) 2024    PAST SURGICAL  HISTORY: Past Surgical History:  Procedure Laterality Date   ANTERIOR CERVICAL DECOMP/DISCECTOMY FUSION     BACK SURGERY     x5   ESOPHAGOGASTRODUODENOSCOPY N/A 07/12/2022   Procedure: ESOPHAGOGASTRODUODENOSCOPY (EGD);  Surgeon: Wilhelmenia Aloha Raddle., MD;  Location: [facility];  Service: Gastroenterology;  Laterality: N/A;   ESOPHAGOGASTRODUODENOSCOPY (EGD) WITH PROPOFOL  N/A 07/05/2022   Procedure: ESOPHAGOGASTRODUODENOSCOPY (EGD) WITH PROPOFOL ;  Surgeon: Onita Elspeth Sharper, DO;  Location: [facility]COPY;  Service: Gastroenterology;  Laterality: N/A;   EUS N/A 07/12/2022   Procedure: UPPER ENDOSCOPIC ULTRASOUND (EUS) RADIAL;  Surgeon: Wilhelmenia Aloha Raddle., MD;  Location: [facility]COPY;  Service: Gastroenterology;  Laterality: N/A;   FINE NEEDLE ASPIRATION  07/12/2022   Procedure: FINE NEEDLE ASPIRATION;  Surgeon: Wilhelmenia Aloha Raddle., MD;  Location: [facility];  Service: Gastroenterology;;   IR CV LINE INJECTION  12/30/2022   IR IMAGING GUIDED PORT INSERTION  09/29/2022   LAPAROSCOPY N/A 08/18/2022   Procedure: STAGING LAPAROSCOPY;  Surgeon: Dasie Leonor CROME, MD;  Location: [facility] OR;  Service: General;  Laterality: N/A;   OPERATIVE ULTRASOUND N/A 08/18/2022   Procedure: INTRAOPERATIVE ULTRASOUND;  Surgeon: Dasie Leonor CROME, MD;  Location: [facility] OR;  Service: General;  Laterality: N/A;   SHOULDER ARTHROSCOPY Bilateral    possibly metal in right shoulder, pt unsure   SPLENECTOMY, TOTAL N/A 08/18/2022   Procedure: SPLENECTOMY;  Surgeon: Dasie Leonor CROME, MD;  Location: [facility] OR;  Service: General;  Laterality: N/A;    FAMILY HISTORY: Family History  Problem Relation Age of Onset   Liver cancer Sister    Spina bifida Paternal Grandfather     ADVANCED DIRECTIVES (Y/N):  N  HEALTH MAINTENANCE: Social History   Tobacco Use   Smoking status: Every Day    Current packs/day: 0.50    Average packs/day: 0.5 packs/day for 40.0 years (20.0 ttl pk-yrs)    Types: Cigarettes   Smokeless  tobacco: Never  Vaping Use   Vaping status: Never Used  Substance Use Topics   Alcohol  use: Not Currently   Drug use: Yes    Frequency: 1.0 times per week    Types: Marijuana     Colonoscopy:  PAP:  Bone density:  Lipid panel:  Allergies  Allergen Reactions   Aspirin     Acid reflux     Pregabalin     Dizziness   Tape     Blisters skin, paper tape is ok   Codeine Palpitations    headaches   Oxymorphone Rash   Penicillins Hives and Rash   Vicodin [Hydrocodone-Acetaminophen ] Palpitations    Headaches     Current Outpatient Medications  Medication Sig Dispense Refill   ALPRAZolam  (XANAX ) 1 MG tablet Take 1 mg by mouth 2 (two) times daily.     budesonide-formoterol  (SYMBICORT) 160-4.5 MCG/ACT inhaler Inhale 2 puffs into the lungs 2 (two) times daily as needed (shortness of breath).     lidocaine -prilocaine  (EMLA ) cream Apply to affected area once 30 g 3   lisinopril  (ZESTRIL ) 20 MG tablet Take 20 mg by mouth daily.     meloxicam (MOBIC) 15 MG tablet Take 15 mg by mouth daily.     methocarbamol  (ROBAXIN ) 500 MG tablet Take 2 tablets (1,000 mg total) by mouth every 6 (six) hours as needed. 60 tablet 0   morphine  (MS CONTIN ) 30 MG 12 hr tablet Take 1 tablet (30 mg total) by mouth every 8 (eight) hours. 60 tablet 0   omeprazole  (PRILOSEC  OTC) 20 MG tablet Take 1 tablet (20 mg total) by mouth daily. 90 tablet 1   ondansetron  (ZOFRAN ) 8 MG tablet Take 1 tablet (8 mg total) by mouth every 8 (eight) hours as needed for nausea or vomiting. 60 tablet 1   Oxycodone  HCl 10 MG TABS Take 1 tablet (10 mg total) by mouth every 4 (four) hours as needed (breakthrough pain). 20 tablet 0   pantoprazole  (PROTONIX ) 40 MG tablet TAKE 1 TABLET BY MOUTH EVERY DAY 90 tablet 1   prochlorperazine  (COMPAZINE ) 10 MG tablet Take 1 tablet (10 mg total) by mouth every 6 (six) hours as needed. 60 tablet 2   rosuvastatin  (CRESTOR ) 20 MG tablet Take 20 mg by mouth daily.     acetaminophen  (TYLENOL ) 500 MG  tablet Take 2 tablets (1,000 mg total) by mouth 3 (three) times daily. (Patient not taking: Reported on 10/17/2023) 30 tablet 0   capecitabine  (XELODA ) 500 MG tablet Take 3 tablets (1,500 mg total) by mouth 2 (two) times daily after a meal. Take for 21 days, then hold for 7 days. Repeat every 28 days. (Patient not taking: Reported on 10/17/2023) 126 tablet 1   KLOR-CON  M20 20 MEQ tablet TAKE 1 TABLET BY MOUTH EVERY DAY (Patient not taking: Reported on 10/17/2023) 90 tablet 1   polyethylene glycol (MIRALAX  / GLYCOLAX ) 17 g packet Take 17 g by mouth daily. (Patient not taking: Reported on 10/17/2023) 14 each 0   senna (SENOKOT) 8.6 MG TABS tablet Take 2 tablets (17.2 mg total) by mouth at bedtime. (Patient not taking: Reported on 10/17/2023) 120 tablet 0   No current facility-administered medications for this visit.    OBJECTIVE: Vitals:   10/17/23 0955  BP: (!) 145/83  Pulse: 82  Resp: 16  Temp: 98 F (36.7 C)  SpO2: 99%       Body mass index is 19.34 kg/m.    ECOG FS:0 - Asymptomatic  General: Well-developed, well-nourished, no acute distress. Eyes: Pink conjunctiva, anicteric sclera. HEENT: Normocephalic, moist mucous membranes. Lungs: No audible wheezing or coughing. Heart: Regular rate and rhythm. Abdomen: Soft, nontender, no obvious distention. Musculoskeletal: No edema, cyanosis, or clubbing. Neuro: Alert, answering all questions appropriately. Cranial nerves grossly intact. Skin: No rashes or petechiae noted. Psych: Normal affect.  LAB RESULTS:  Lab Results  Component Value Date   NA 137 07/11/2023   K 3.8 07/11/2023   CL 101 07/11/2023   CO2 28 07/11/2023   GLUCOSE 138 (H) 07/11/2023   BUN 11 07/11/2023   CREATININE 0.72 07/11/2023   CALCIUM  8.7 (L) 07/11/2023   PROT 7.3 07/11/2023   ALBUMIN  4.1 07/11/2023   AST 18 07/11/2023   ALT 14 07/11/2023   ALKPHOS 57 07/11/2023   BILITOT 0.3 07/11/2023   GFRNONAA >60 07/11/2023   GFRAA >60 04/13/2018    Lab Results   Component Value Date   WBC 10.0 07/11/2023   NEUTROABS 4.4 07/11/2023   HGB 13.4 07/11/2023   HCT 39.4 07/11/2023   MCV 90.8 07/11/2023   PLT 342 07/11/2023     STUDIES: CT ABD PELVIS W/WO CM ONCOLOGY PANCREATIC PROTOCOL Result Date: 10/14/2023 CLINICAL DATA:  Follow-up pancreatic cancer. EXAM: CT ABDOMEN AND PELVIS WITHOUT AND WITH CONTRAST TECHNIQUE: Multidetector CT imaging of the abdomen and pelvis was performed following the standard protocol before and following the bolus administration of intravenous contrast. RADIATION DOSE REDUCTION: This exam was performed according to the departmental dose-optimization program which includes automated exposure control, adjustment of the mA and/or kV according to patient size and/or use of iterative reconstruction technique. CONTRAST:  OMNIPAQUE  IOHEXOL  300 MG/ML  SOLN COMPARISON:  07/11/2023 FINDINGS: Lower chest: Heart is normal size.  Lung bases are clear. Hepatobiliary: Few tiny liver cysts. No new or suspicious liver lesions. Gallbladder and biliary tree are normal. Pancreas: Evidence of previous partial pancreatectomy of the mid to distal segment. 1.1 cm hypodense focus adjacent the residual pancreas unchanged and likely postoperative. Spleen: Prior splenectomy. Adrenals/Urinary Tract: Right adrenal gland is normal. Stable mild prominence of the left adrenal gland measuring 1.4 x 1.5 cm seen to be mildly hypermetabolic on prior PET-CT likely small stable metastatic focus. Kidneys are normal in size without hydronephrosis. Stable small left renal cyst. Kidneys are otherwise unchanged. Ureters and bladder are unremarkable. Stomach/Bowel: Stomach and small bowel are normal. Minimal diverticulosis of the colon. Appendix is normal. Vascular/Lymphatic: Mild calcified plaque over the abdominal aorta which is normal in caliber. Remaining vascular structures are unremarkable. Few small subcentimeter lymph nodes over the gastrohepatic ligament and mesentery  unchanged couple small stable portacaval lymph nodes. Reproductive: Minimal stable prostatic enlargement with mild impression upon the bladder base. Other: No evidence of free fluid. Musculoskeletal: Posterior fusion hardware intact at the L5-S1 level. No focal abnormality to suggest metastatic disease. IMPRESSION: 1. No acute findings in the abdomen/pelvis. 2. Evidence of previous partial pancreatectomy. Stable 1.1 cm hypodense focus adjacent the residual pancreas likely postoperative. 3. Stable 1.4 x 1.5 cm left adrenal nodule seen to be mildly hypermetabolic on prior PET-CT likely small stable metastatic focus. 4. Stable small left renal cyst. 5. Minimal colonic diverticulosis. 6. Aortic atherosclerosis. Aortic Atherosclerosis (ICD10-I70.0). Electronically Signed   By: Toribio Agreste M.D.   On:  10/14/2023 16:36    ASSESSMENT: Pathologic stage IIa adenocarcinoma the pancreas.  PLAN:    Pathologic stage IIa adenocarcinoma the pancreas: Patient underwent surgical resection on August 18, 2022 confirming stage of disease.  Previously, PET scan did not reveal any metastatic disease.  Preoperatively patient's CA 19-9 was 130.  Currently CA 19-9 is mildly elevated, but stable and unchanged at 37.  Today's result is pending.  Initial plan was to do 4-6 cycles of gemcitabine  and Abraxane, but given the cumulative effects of treatment and noncompliance, patient discontinued treatment after cycle 4 on January 28, 2023.  Repeat PET scan on April 01, 2023 reviewed independently and with no obvious evidence of recurrent or progressive disease.  Repeat CT scan on October 14, 2023 reviewed independently and reported as above with no obvious evidence of recurrent or progressive disease.  No intervention is needed at this time.  Return to clinic in 3 months for laboratory work and evaluation.  Plan to repeat imaging in 6 months. Pain: Patient does not complain of this today.  Patient has been on chronic narcotics for greater  than 20 years.  Continue monitoring treatment per primary care. Leukocytosis: Chronic and unchanged.  Patient's total white blood cell count is mildly elevated today. 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 Hypertension: Blood pressure is mildly elevated today.  Continue monitoring treatment per primary care. Port: Patient has an appointment for port flush in 6 weeks and again in 3 months.    Patient expressed understanding and was in agreement with this plan. He also understands that He can call clinic at any time with any questions, concerns, or complaints.    Cancer Staging  Pancreatic adenocarcinoma [facility]) Staging form: Exocrine Pancreas, AJCC 8th Edition - Clinical stage from 07/16/2022: Stage IIA (cT3, cN0, cM0) - Signed by Jacobo Evalene PARAS, MD on 07/16/2022 Stage prefix: Initial diagnosis Total positive nodes: 0   Evalene PARAS Jacobo, MD   10/17/2023 10:01 AM

## 2023-10-18 LAB — CANCER ANTIGEN 19-9: CA 19-9: 44 U/mL — ABNORMAL HIGH (ref 0–35)

## 2023-11-11 DIAGNOSIS — G629 Polyneuropathy, unspecified: Secondary | ICD-10-CM | POA: Diagnosis not present

## 2023-11-11 DIAGNOSIS — I10 Essential (primary) hypertension: Secondary | ICD-10-CM | POA: Diagnosis not present

## 2023-11-11 DIAGNOSIS — M545 Low back pain, unspecified: Secondary | ICD-10-CM | POA: Diagnosis not present

## 2023-11-11 DIAGNOSIS — Z125 Encounter for screening for malignant neoplasm of prostate: Secondary | ICD-10-CM | POA: Diagnosis not present

## 2023-11-11 DIAGNOSIS — G8929 Other chronic pain: Secondary | ICD-10-CM | POA: Diagnosis not present

## 2023-11-28 ENCOUNTER — Inpatient Hospital Stay: Attending: Oncology

## 2023-11-28 ENCOUNTER — Encounter

## 2024-01-06 ENCOUNTER — Encounter: Payer: Self-pay | Admitting: Oncology

## 2024-01-16 ENCOUNTER — Other Ambulatory Visit: Payer: Self-pay | Admitting: Hospice and Palliative Medicine

## 2024-01-17 ENCOUNTER — Other Ambulatory Visit: Payer: Self-pay | Admitting: *Deleted

## 2024-01-17 ENCOUNTER — Inpatient Hospital Stay: Attending: Oncology

## 2024-01-17 ENCOUNTER — Encounter: Payer: Self-pay | Admitting: Oncology

## 2024-01-17 ENCOUNTER — Telehealth: Payer: Self-pay

## 2024-01-17 ENCOUNTER — Inpatient Hospital Stay (HOSPITAL_BASED_OUTPATIENT_CLINIC_OR_DEPARTMENT_OTHER): Admitting: Oncology

## 2024-01-17 VITALS — BP 137/77 | HR 67 | Temp 98.6°F | Resp 20 | Wt 130.2 lb

## 2024-01-17 DIAGNOSIS — D72829 Elevated white blood cell count, unspecified: Secondary | ICD-10-CM | POA: Insufficient documentation

## 2024-01-17 DIAGNOSIS — R110 Nausea: Secondary | ICD-10-CM | POA: Diagnosis not present

## 2024-01-17 DIAGNOSIS — C259 Malignant neoplasm of pancreas, unspecified: Secondary | ICD-10-CM | POA: Diagnosis not present

## 2024-01-17 DIAGNOSIS — R197 Diarrhea, unspecified: Secondary | ICD-10-CM | POA: Insufficient documentation

## 2024-01-17 DIAGNOSIS — Z8507 Personal history of malignant neoplasm of pancreas: Secondary | ICD-10-CM | POA: Insufficient documentation

## 2024-01-17 DIAGNOSIS — Z9081 Acquired absence of spleen: Secondary | ICD-10-CM | POA: Diagnosis not present

## 2024-01-17 DIAGNOSIS — Z95828 Presence of other vascular implants and grafts: Secondary | ICD-10-CM

## 2024-01-17 LAB — CBC WITH DIFFERENTIAL/PLATELET
Abs Immature Granulocytes: 0.05 K/uL (ref 0.00–0.07)
Basophils Absolute: 0.1 K/uL (ref 0.0–0.1)
Basophils Relative: 1 %
Eosinophils Absolute: 0.4 K/uL (ref 0.0–0.5)
Eosinophils Relative: 3 %
HCT: 39.5 % (ref 39.0–52.0)
Hemoglobin: 13.6 g/dL (ref 13.0–17.0)
Immature Granulocytes: 0 %
Lymphocytes Relative: 30 %
Lymphs Abs: 3.6 K/uL (ref 0.7–4.0)
MCH: 30.9 pg (ref 26.0–34.0)
MCHC: 34.4 g/dL (ref 30.0–36.0)
MCV: 89.8 fL (ref 80.0–100.0)
Monocytes Absolute: 0.9 K/uL (ref 0.1–1.0)
Monocytes Relative: 8 %
Neutro Abs: 7.1 K/uL (ref 1.7–7.7)
Neutrophils Relative %: 58 %
Platelets: 254 K/uL (ref 150–400)
RBC: 4.4 MIL/uL (ref 4.22–5.81)
RDW: 13.2 % (ref 11.5–15.5)
WBC: 12.2 K/uL — ABNORMAL HIGH (ref 4.0–10.5)
nRBC: 0 % (ref 0.0–0.2)

## 2024-01-17 LAB — CMP (CANCER CENTER ONLY)
ALT: 12 U/L (ref 0–44)
AST: 19 U/L (ref 15–41)
Albumin: 4.2 g/dL (ref 3.5–5.0)
Alkaline Phosphatase: 58 U/L (ref 38–126)
Anion gap: 9 (ref 5–15)
BUN: 15 mg/dL (ref 8–23)
CO2: 24 mmol/L (ref 22–32)
Calcium: 8.8 mg/dL — ABNORMAL LOW (ref 8.9–10.3)
Chloride: 104 mmol/L (ref 98–111)
Creatinine: 0.81 mg/dL (ref 0.61–1.24)
GFR, Estimated: >60 mL/min (ref >60)
Glucose, Bld: 131 mg/dL — ABNORMAL HIGH (ref 70–99)
Potassium: 3.6 mmol/L (ref 3.5–5.1)
Sodium: 137 mmol/L (ref 135–145)
Total Bilirubin: 0.3 mg/dL (ref 0.0–1.2)
Total Protein: 7.4 g/dL (ref 6.5–8.1)

## 2024-01-17 MED ORDER — PROCHLORPERAZINE MALEATE 10 MG PO TABS: 10.0000 mg | ORAL_TABLET | Freq: Four times a day (QID) | ORAL | 2 refills | Status: AC | PRN

## 2024-01-17 MED ORDER — DIPHENOXYLATE-ATROPINE 2.5-0.025 MG PO TABS: 1.0000 | ORAL_TABLET | Freq: Four times a day (QID) | ORAL | 0 refills | Status: AC | PRN

## 2024-01-17 NOTE — Progress Notes (Signed)
 [facility]  Telephone:(336) [phone] Fax:(336) [phone]  ID: David Harrell OB: 27-Jul-1960  MR#: [id number]  RDW#:[id number]  Patient Care Team: Rudolpho Norleen BIRCH, MD as PCP - General (Internal Medicine) Maurie Rayfield BIRCH, RN as Oncology Nurse Navigator Jacobo, Evalene PARAS, MD as Consulting Physician (Oncology)   CHIEF COMPLAINT: Pathologic stage IIa adenocarcinoma the pancreas.  INTERVAL HISTORY: Patient returns to clinic today for repeat laboratory work and further evaluation.  He continues to have intermittent nausea and diarrhea as well as increased fatigue. He does not complain of any pain today.  He denies any recent fevers or illnesses.  He has no neurologic complaints.  He has no chest pain, shortness of breath, cough, or hemoptysis.  He has no vomiting or constipation.  He has no melena or hematochezia.  He has no urinary complaints.  Patient offers no further specific complaints today.  REVIEW OF SYSTEMS:   Review of Systems  Constitutional: Negative.  Negative for fever, malaise/fatigue and weight loss.  Respiratory: Negative.  Negative for cough, hemoptysis and shortness of breath.   Cardiovascular: Negative.  Negative for chest pain and leg swelling.  Gastrointestinal:  Positive for diarrhea and nausea. Negative for abdominal pain, blood in stool, melena and vomiting.  Genitourinary: Negative.  Negative for dysuria.  Musculoskeletal: Negative.  Negative for back pain.  Skin: Negative.  Negative for rash.  Neurological: Negative.  Negative for dizziness, focal weakness, weakness and headaches.  Psychiatric/Behavioral: Negative.  The patient is not nervous/anxious.     As per HPI. Otherwise, a complete review of systems is negative.  PAST MEDICAL HISTORY: Past Medical History:  Diagnosis Date   Arthritis    GERD (gastroesophageal reflux disease)    Hiatal hernia    History of kidney stones    Hypertension    Pancreatic cancer (HCC) 2024    PAST  SURGICAL HISTORY: Past Surgical History:  Procedure Laterality Date   ANTERIOR CERVICAL DECOMP/DISCECTOMY FUSION     BACK SURGERY     x5   ESOPHAGOGASTRODUODENOSCOPY N/A 07/12/2022   Procedure: ESOPHAGOGASTRODUODENOSCOPY (EGD);  Surgeon: Wilhelmenia Aloha Raddle., MD;  Location: [facility];  Service: Gastroenterology;  Laterality: N/A;   ESOPHAGOGASTRODUODENOSCOPY (EGD) WITH PROPOFOL  N/A 07/05/2022   Procedure: ESOPHAGOGASTRODUODENOSCOPY (EGD) WITH PROPOFOL ;  Surgeon: Onita Elspeth Sharper, DO;  Location: [facility]COPY;  Service: Gastroenterology;  Laterality: N/A;   EUS N/A 07/12/2022   Procedure: UPPER ENDOSCOPIC ULTRASOUND (EUS) RADIAL;  Surgeon: Wilhelmenia Aloha Raddle., MD;  Location: [facility];  Service: Gastroenterology;  Laterality: N/A;   FINE NEEDLE ASPIRATION  07/12/2022   Procedure: FINE NEEDLE ASPIRATION;  Surgeon: Wilhelmenia Aloha Raddle., MD;  Location: [facility];  Service: Gastroenterology;;   IR CV LINE INJECTION  12/30/2022   IR IMAGING GUIDED PORT INSERTION  09/29/2022   LAPAROSCOPY N/A 08/18/2022   Procedure: STAGING LAPAROSCOPY;  Surgeon: Dasie Leonor CROME, MD;  Location[facility];  Service: General;  Laterality: N/A;   OPERATIVE ULTRASOUND N/A 08/18/2022   Procedure: INTRAOPERATIVE ULTRASOUND;  Surgeon: Dasie Leonor CROME, MD;  Location: [facility] OR;  Service: General;  Laterality: N/A;   SHOULDER ARTHROSCOPY Bilateral    possibly metal in right shoulder, pt unsure   SPLENECTOMY, TOTAL N/A 08/18/2022   Procedure: SPLENECTOMY;  Surgeon: Dasie Leonor CROME, MD;  Location: [facility] OR;  Service: General;  Laterality: N/A;    FAMILY HISTORY: Family History  Problem Relation Age of Onset   Liver cancer Sister    Spina bifida Paternal Grandfather     ADVANCED DIRECTIVES (  Y/N):  N  HEALTH MAINTENANCE: Social History   Tobacco Use   Smoking status: Every Day    Current packs/day: 0.50    Average packs/day: 0.5 packs/day for 40.0 years (20.0 ttl pk-yrs)    Types: Cigarettes    Smokeless tobacco: Never  Vaping Use   Vaping status: Never Used  Substance Use Topics   Alcohol  use: Not Currently   Drug use: Yes    Frequency: 1.0 times per week    Types: Marijuana     Colonoscopy:  PAP:  Bone density:  Lipid panel:  Allergies  Allergen Reactions   Aspirin     Acid reflux     Pregabalin     Dizziness   Tape     Blisters skin, paper tape is ok   Codeine Palpitations    headaches   Oxymorphone Rash   Penicillins Hives and Rash   Vicodin [Hydrocodone-Acetaminophen ] Palpitations    Headaches     Current Outpatient Medications  Medication Sig Dispense Refill   ALPRAZolam  (XANAX ) 1 MG tablet Take 1 mg by mouth 2 (two) times daily.     budesonide-formoterol  (SYMBICORT) 160-4.5 MCG/ACT inhaler Inhale 2 puffs into the lungs 2 (two) times daily as needed (shortness of breath).     lidocaine -prilocaine  (EMLA ) cream Apply 1 hour prior to port flush appointment. Cover with plastic wrap. 30 g 3   lisinopril  (ZESTRIL ) 20 MG tablet Take 20 mg by mouth daily.     meloxicam (MOBIC) 15 MG tablet Take 15 mg by mouth daily.     methocarbamol  (ROBAXIN ) 500 MG tablet Take 2 tablets (1,000 mg total) by mouth every 6 (six) hours as needed. 60 tablet 0   morphine  (MS CONTIN ) 30 MG 12 hr tablet Take 1 tablet (30 mg total) by mouth every 8 (eight) hours. 60 tablet 0   omeprazole  (PRILOSEC  OTC) 20 MG tablet Take 1 tablet (20 mg total) by mouth daily. 90 tablet 1   Oxycodone  HCl 10 MG TABS Take 1 tablet (10 mg total) by mouth every 4 (four) hours as needed (breakthrough pain). 20 tablet 0   pantoprazole  (PROTONIX ) 40 MG tablet TAKE 1 TABLET BY MOUTH EVERY DAY 90 tablet 1   rosuvastatin  (CRESTOR ) 20 MG tablet Take 20 mg by mouth daily.     acetaminophen  (TYLENOL ) 500 MG tablet Take 2 tablets (1,000 mg total) by mouth 3 (three) times daily. (Patient not taking: Reported on 10/17/2023) 30 tablet 0   capecitabine  (XELODA ) 500 MG tablet Take 3 tablets (1,500 mg total) by mouth 2 (two)  times daily after a meal. Take for 21 days, then hold for 7 days. Repeat every 28 days. (Patient not taking: Reported on 10/17/2023) 126 tablet 1   diphenoxylate-atropine (LOMOTIL) 2.5-0.025 MG tablet Take 1 tablet by mouth 4 (four) times daily as needed for diarrhea or loose stools. 30 tablet 0   KLOR-CON  M20 20 MEQ tablet TAKE 1 TABLET BY MOUTH EVERY DAY (Patient not taking: Reported on 10/17/2023) 90 tablet 1   ondansetron  (ZOFRAN ) 8 MG tablet Take 1 tablet (8 mg total) by mouth every 8 (eight) hours as needed for nausea or vomiting. 60 tablet 1   polyethylene glycol (MIRALAX  / GLYCOLAX ) 17 g packet Take 17 g by mouth daily. (Patient not taking: Reported on 10/17/2023) 14 each 0   prochlorperazine  (COMPAZINE ) 10 MG tablet Take 1 tablet (10 mg total) by mouth every 6 (six) hours as needed. 60 tablet 2   senna (SENOKOT) 8.6 MG TABS tablet  Take 2 tablets (17.2 mg total) by mouth at bedtime. (Patient not taking: Reported on 10/17/2023) 120 tablet 0   No current facility-administered medications for this visit.    OBJECTIVE: Vitals:   01/17/24 0957  BP: 137/77  Pulse: 67  Resp: 20  Temp: 98.6 F (37 C)  SpO2: 98%       Body mass index is 19.8 kg/m.    ECOG FS:0 - Asymptomatic  General: Well-developed, well-nourished, no acute distress. Eyes: Pink conjunctiva, anicteric sclera. HEENT: Normocephalic, moist mucous membranes. Lungs: No audible wheezing or coughing. Heart: Regular rate and rhythm. Abdomen: Soft, nontender, no obvious distention. Musculoskeletal: No edema, cyanosis, or clubbing. Neuro: Alert, answering all questions appropriately. Cranial nerves grossly intact. Skin: No rashes or petechiae noted. Psych: Normal affect.  LAB RESULTS:  Lab Results  Component Value Date   NA 137 01/17/2024   K 3.6 01/17/2024   CL 104 01/17/2024   CO2 24 01/17/2024   GLUCOSE 131 (H) 01/17/2024   BUN 15 01/17/2024   CREATININE 0.81 01/17/2024   CALCIUM  8.8 (L) 01/17/2024   PROT 7.4  01/17/2024   ALBUMIN  4.2 01/17/2024   AST 19 01/17/2024   ALT 12 01/17/2024   ALKPHOS 58 01/17/2024   BILITOT 0.3 01/17/2024   GFRNONAA >60 01/17/2024   GFRAA >60 04/13/2018    Lab Results  Component Value Date   WBC 12.2 (H) 01/17/2024   NEUTROABS 7.1 01/17/2024   HGB 13.6 01/17/2024   HCT 39.5 01/17/2024   MCV 89.8 01/17/2024   PLT 254 01/17/2024     STUDIES: No results found.   ASSESSMENT: Pathologic stage IIa adenocarcinoma the pancreas.  PLAN:    Pathologic stage IIa adenocarcinoma the pancreas: Patient underwent surgical resection on August 18, 2022 confirming stage of disease.  Previously, PET scan did not reveal any metastatic disease.  Preoperatively patient's CA 19-9 was 130.  Initial plan was to do 4-6 cycles of gemcitabine  and Abraxane, but given the cumulative effects of treatment and noncompliance, patient discontinued treatment after cycle 4 on January 28, 2023.  Repeat PET scan on April 01, 2023 reviewed independently and with no obvious evidence of recurrent or progressive disease.  Patient's CEA has ranged between 34 and 44 since March 25, 2023.  Today's result is pending.  His most recent imaging on October 14, 2023 reviewed independently with no obvious evidence of recurrent or progressive disease.  No intervention is needed at this time.  Return to clinic in 3 months with repeat laboratory work, imaging, and further evaluation. Pain: Patient does not complain of this today.  Patient has been on chronic narcotics for greater than 20 years.  Continue monitoring treatment per primary care. Leukocytosis: Chronic and unchanged.   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 Hypertension: Blood pressure is within normal limits today.  Continue monitoring treatment per primary care. Port: Patient has requested port removal. Nausea: Patient was given a refill for Compazine . Diarrhea: Patient was given a prescription for Lomotil.  Possibly related to  his history of partial pancreatectomy.  Patient was given a referral to GI.  Patient expressed understanding and was in agreement with this plan. He also understands that He can call clinic at any time with any questions, concerns, or complaints.    Cancer Staging  Pancreatic adenocarcinoma [facility]) Staging form: Exocrine Pancreas, AJCC 8th Edition - Clinical stage from 07/16/2022: Stage IIA (cT3, cN0, cM0) - Signed by Jacobo Evalene PARAS, MD on 07/16/2022 Stage prefix: Initial diagnosis Total positive nodes:  0   Evalene JINNY Reusing, MD   01/17/2024 2:26 PM

## 2024-01-17 NOTE — Progress Notes (Signed)
 Patient complains of diarrhea for the past 3 days.  States 4 or more loose stools daily.  Also complains of nausea.  Wants to know why MD denied refill on Compazine .

## 2024-01-17 NOTE — Telephone Encounter (Signed)
 Patient forgot to discuss need for immunizations at visit today.  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  He last received Meningococcal B, OMV and  Meningococcal Mcv4o on 01/28/2023.  Does he need to receive again this year and can he get at [facility]

## 2024-01-17 NOTE — Telephone Encounter (Signed)
 [name], could you contact patient?    David Harrell informed that he should contact PCP regarding future immunizations.  Patient stated that the surgeon that removed his spleen told him he would need vaccines every 3-4 months.

## 2024-01-18 ENCOUNTER — Telehealth: Payer: Self-pay | Admitting: *Deleted

## 2024-01-18 LAB — CANCER ANTIGEN 19-9: CA 19-9: 42 U/mL — ABNORMAL HIGH (ref 0–35)

## 2024-01-18 NOTE — Telephone Encounter (Signed)
 RN placed call to patient to confirm appointment details for port removal on Friday 11/14. Patient scheduled for Friday 11/14 port removal at 2:30 pm. Patient aware to check in at the [facility] at 2:15 pm. Patient aware that no driver is required, only local anesthesia will be used. Patient does not have to be NPO prior to procedure.   RN also answered questions that patient had regarding post-splenectomy vaccines. Dr. Jacobo recommends that patient follow up with PCP or surgeon at this point in time regarding annual vaccine needs and requirements. Patient and his wife verbalized understanding and are in agreement with plan.

## 2024-01-19 NOTE — Progress Notes (Signed)
 Patient for IR Port Removal on Friday 01/20/24, I called and was unable to lvm due to mailbox was full. [name], RN at the [facility] here at [facility] said that she spoke with the patient on Wed 01/18/24 and gave the instructions and location to the patient.

## 2024-01-20 ENCOUNTER — Ambulatory Visit
Admission: RE | Admit: 2024-01-20 | Discharge: 2024-01-20 | Disposition: A | Discharge status: Home / Self Care | Source: Ambulatory Visit | Attending: Oncology | Admitting: Oncology

## 2024-01-20 DIAGNOSIS — Z452 Encounter for adjustment and management of vascular access device: Secondary | ICD-10-CM | POA: Insufficient documentation

## 2024-01-20 DIAGNOSIS — Z8507 Personal history of malignant neoplasm of pancreas: Secondary | ICD-10-CM | POA: Diagnosis not present

## 2024-01-20 DIAGNOSIS — Z95828 Presence of other vascular implants and grafts: Secondary | ICD-10-CM

## 2024-01-20 HISTORY — PX: IR REMOVAL TUN ACCESS W/ PORT W/O FL MOD SED: IMG2290

## 2024-01-20 MED ORDER — LIDOCAINE-EPINEPHRINE 1 %-1:100000 IJ SOLN
10.0000 mL | Freq: Once | INTRAMUSCULAR | Status: AC
  Administered 2024-01-20: 5 mL via INTRADERMAL

## 2024-01-20 MED ORDER — LIDOCAINE-EPINEPHRINE 1 %-1:100000 IJ SOLN
INTRAMUSCULAR | Status: AC
Start: 2024-01-20 — End: 2024-01-20
  Filled 2024-01-20: qty 1

## 2024-02-14 ENCOUNTER — Other Ambulatory Visit: Payer: Self-pay | Admitting: Oncology

## 2024-02-14 DIAGNOSIS — C259 Malignant neoplasm of pancreas, unspecified: Secondary | ICD-10-CM

## 2024-02-28 ENCOUNTER — Inpatient Hospital Stay

## 2024-03-05 NOTE — Progress Notes (Signed)
 David Harrell                                          MRN: [id number]   03/05/2024   The [facility] Quality Team Specialist reviewed this patient medical record for the purposes of chart review for care gap closure. The following were reviewed: abstraction for care gap closure-controlling blood pressure.    [facility] Quality Team

## 2024-04-12 ENCOUNTER — Other Ambulatory Visit: Payer: Self-pay | Admitting: Oncology

## 2024-04-12 DIAGNOSIS — C259 Malignant neoplasm of pancreas, unspecified: Secondary | ICD-10-CM

## 2024-04-18 ENCOUNTER — Ambulatory Visit

## 2024-04-18 ENCOUNTER — Inpatient Hospital Stay

## 2024-04-25 ENCOUNTER — Inpatient Hospital Stay: Admitting: Oncology

## 2024-04-25 ENCOUNTER — Inpatient Hospital Stay
